# Patient Record
Sex: Male | Born: 1950 | Race: White | Hispanic: No | Smoking: Never smoker
Health system: Southern US, Community
[De-identification: ages and names within clinical notes are randomized; demographics above are authoritative.]

## PROBLEM LIST (undated history)

## (undated) DIAGNOSIS — I4891 Unspecified atrial fibrillation: Secondary | ICD-10-CM

## (undated) DIAGNOSIS — I10 Essential (primary) hypertension: Secondary | ICD-10-CM

## (undated) DIAGNOSIS — C140 Malignant neoplasm of pharynx, unspecified: Secondary | ICD-10-CM

## (undated) DIAGNOSIS — E119 Type 2 diabetes mellitus without complications: Secondary | ICD-10-CM

## (undated) DIAGNOSIS — I517 Cardiomegaly: Secondary | ICD-10-CM

## (undated) DIAGNOSIS — E785 Hyperlipidemia, unspecified: Secondary | ICD-10-CM

## (undated) DIAGNOSIS — K219 Gastro-esophageal reflux disease without esophagitis: Secondary | ICD-10-CM

## (undated) DIAGNOSIS — G4733 Obstructive sleep apnea (adult) (pediatric): Secondary | ICD-10-CM

## (undated) DIAGNOSIS — E039 Hypothyroidism, unspecified: Secondary | ICD-10-CM

## (undated) DIAGNOSIS — G4730 Sleep apnea, unspecified: Secondary | ICD-10-CM

## (undated) DIAGNOSIS — Z7901 Long term (current) use of anticoagulants: Secondary | ICD-10-CM

## (undated) DIAGNOSIS — C801 Malignant (primary) neoplasm, unspecified: Secondary | ICD-10-CM

## (undated) DIAGNOSIS — Z5181 Encounter for therapeutic drug level monitoring: Secondary | ICD-10-CM

## (undated) DIAGNOSIS — I4819 Other persistent atrial fibrillation: Secondary | ICD-10-CM

## (undated) DIAGNOSIS — I2510 Atherosclerotic heart disease of native coronary artery without angina pectoris: Secondary | ICD-10-CM

## (undated) HISTORY — DX: Hyperlipidemia, unspecified: E78.5

## (undated) HISTORY — PX: CARDIAC CATHETERIZATION: SHX172

## (undated) HISTORY — DX: Gastro-esophageal reflux disease without esophagitis: K21.9

## (undated) HISTORY — DX: Long term (current) use of anticoagulants: Z79.01

## (undated) HISTORY — DX: Malignant neoplasm of pharynx, unspecified: C14.0

## (undated) HISTORY — DX: Atherosclerotic heart disease of native coronary artery without angina pectoris: I25.10

## (undated) HISTORY — DX: Encounter for therapeutic drug level monitoring: Z51.81

## (undated) HISTORY — DX: Sleep apnea, unspecified: G47.30

## (undated) HISTORY — DX: Obstructive sleep apnea (adult) (pediatric): G47.33

## (undated) HISTORY — DX: Type 2 diabetes mellitus without complications: E11.9

## (undated) HISTORY — DX: Cardiomegaly: I51.7

## (undated) HISTORY — PX: BREAST SURGERY: SHX581

## (undated) HISTORY — DX: Other persistent atrial fibrillation: I48.19

## (undated) HISTORY — DX: Unspecified atrial fibrillation: I48.91

## (undated) HISTORY — DX: Essential (primary) hypertension: I10

## (undated) HISTORY — DX: Hypothyroidism, unspecified: E03.9

---

## 1998-01-08 ENCOUNTER — Ambulatory Visit: Admission: RE | Admit: 1998-01-08 | Discharge: 1998-01-08 | Payer: Self-pay | Admitting: Family Medicine

## 1998-06-11 ENCOUNTER — Ambulatory Visit: Admission: RE | Admit: 1998-06-11 | Discharge: 1998-06-11 | Payer: Self-pay | Admitting: Family Medicine

## 1998-09-19 ENCOUNTER — Ambulatory Visit: Admission: RE | Admit: 1998-09-19 | Discharge: 1998-09-19 | Payer: Self-pay | Admitting: Internal Medicine

## 2002-12-30 ENCOUNTER — Encounter: Admission: RE | Admit: 2002-12-30 | Discharge: 2002-12-30 | Payer: Self-pay | Admitting: Interventional Cardiology

## 2002-12-30 ENCOUNTER — Encounter: Payer: Self-pay | Admitting: Interventional Cardiology

## 2003-01-01 ENCOUNTER — Ambulatory Visit (HOSPITAL_COMMUNITY): Admission: RE | Admit: 2003-01-01 | Discharge: 2003-01-01 | Payer: Self-pay | Admitting: Interventional Cardiology

## 2004-02-01 ENCOUNTER — Ambulatory Visit (HOSPITAL_COMMUNITY): Admission: RE | Admit: 2004-02-01 | Discharge: 2004-02-01 | Payer: Self-pay | Admitting: Gastroenterology

## 2004-02-01 ENCOUNTER — Encounter (INDEPENDENT_AMBULATORY_CARE_PROVIDER_SITE_OTHER): Payer: Self-pay | Admitting: *Deleted

## 2004-04-26 ENCOUNTER — Emergency Department (HOSPITAL_COMMUNITY): Admission: EM | Admit: 2004-04-26 | Discharge: 2004-04-26 | Payer: Self-pay | Admitting: Emergency Medicine

## 2006-02-20 ENCOUNTER — Ambulatory Visit: Admission: RE | Admit: 2006-02-20 | Discharge: 2006-05-21 | Payer: Self-pay | Admitting: Radiation Oncology

## 2006-02-21 ENCOUNTER — Ambulatory Visit: Payer: Self-pay | Admitting: Internal Medicine

## 2006-02-22 ENCOUNTER — Ambulatory Visit (HOSPITAL_COMMUNITY): Admission: RE | Admit: 2006-02-22 | Discharge: 2006-02-22 | Payer: Self-pay | Admitting: *Deleted

## 2006-02-23 ENCOUNTER — Ambulatory Visit: Payer: Self-pay | Admitting: Dentistry

## 2006-02-23 ENCOUNTER — Encounter: Admission: EM | Admit: 2006-02-23 | Discharge: 2006-02-23 | Payer: Self-pay | Admitting: Radiation Oncology

## 2006-03-01 ENCOUNTER — Ambulatory Visit (HOSPITAL_COMMUNITY): Admission: RE | Admit: 2006-03-01 | Discharge: 2006-03-01 | Payer: Self-pay | Admitting: Radiation Oncology

## 2006-03-06 ENCOUNTER — Ambulatory Visit: Payer: Self-pay | Admitting: Dentistry

## 2006-03-06 ENCOUNTER — Ambulatory Visit (HOSPITAL_COMMUNITY): Admission: RE | Admit: 2006-03-06 | Discharge: 2006-03-06 | Payer: Self-pay | Admitting: Dentistry

## 2006-03-13 LAB — CBC WITH DIFFERENTIAL/PLATELET
Basophils Absolute: 0 10*3/uL (ref 0.0–0.1)
EOS%: 7.1 % — ABNORMAL HIGH (ref 0.0–7.0)
Eosinophils Absolute: 0.5 10*3/uL (ref 0.0–0.5)
LYMPH%: 16.2 % (ref 14.0–48.0)
MCH: 30.5 pg (ref 28.0–33.4)
MCV: 86.6 fL (ref 81.6–98.0)
MONO%: 11 % (ref 0.0–13.0)
NEUT#: 4.8 10*3/uL (ref 1.5–6.5)
Platelets: 269 10*3/uL (ref 145–400)
RBC: 4.34 10*6/uL (ref 4.20–5.71)
RDW: 12.9 % (ref 11.2–14.6)

## 2006-03-13 LAB — COMPREHENSIVE METABOLIC PANEL
AST: 15 U/L (ref 0–37)
Alkaline Phosphatase: 57 U/L (ref 39–117)
BUN: 15 mg/dL (ref 6–23)
Glucose, Bld: 204 mg/dL — ABNORMAL HIGH (ref 70–99)
Potassium: 4.1 mEq/L (ref 3.5–5.3)
Sodium: 138 mEq/L (ref 135–145)
Total Bilirubin: 0.5 mg/dL (ref 0.3–1.2)

## 2006-03-16 ENCOUNTER — Ambulatory Visit (HOSPITAL_COMMUNITY): Admission: RE | Admit: 2006-03-16 | Discharge: 2006-03-16 | Payer: Self-pay | Admitting: Internal Medicine

## 2006-03-19 ENCOUNTER — Ambulatory Visit (HOSPITAL_COMMUNITY): Admission: RE | Admit: 2006-03-19 | Discharge: 2006-03-19 | Payer: Self-pay | Admitting: Internal Medicine

## 2006-03-20 LAB — CBC WITH DIFFERENTIAL/PLATELET
Basophils Absolute: 0 10*3/uL (ref 0.0–0.1)
EOS%: 0 % (ref 0.0–7.0)
Eosinophils Absolute: 0 10*3/uL (ref 0.0–0.5)
HGB: 12.8 g/dL — ABNORMAL LOW (ref 13.0–17.1)
LYMPH%: 5.3 % — ABNORMAL LOW (ref 14.0–48.0)
MCH: 30.2 pg (ref 28.0–33.4)
MCV: 87 fL (ref 81.6–98.0)
MONO%: 3.9 % (ref 0.0–13.0)
NEUT#: 11.7 10*3/uL — ABNORMAL HIGH (ref 1.5–6.5)
NEUT%: 90.7 % — ABNORMAL HIGH (ref 40.0–75.0)
Platelets: 256 10*3/uL (ref 145–400)

## 2006-03-20 LAB — COMPREHENSIVE METABOLIC PANEL
Albumin: 4.3 g/dL (ref 3.5–5.2)
Alkaline Phosphatase: 55 U/L (ref 39–117)
BUN: 19 mg/dL (ref 6–23)
Creatinine, Ser: 1.07 mg/dL (ref 0.40–1.50)
Glucose, Bld: 295 mg/dL — ABNORMAL HIGH (ref 70–99)
Total Bilirubin: 0.4 mg/dL (ref 0.3–1.2)

## 2006-03-27 LAB — COMPREHENSIVE METABOLIC PANEL
AST: 12 U/L (ref 0–37)
Albumin: 4.1 g/dL (ref 3.5–5.2)
Alkaline Phosphatase: 63 U/L (ref 39–117)
Potassium: 4 mEq/L (ref 3.5–5.3)
Sodium: 134 mEq/L — ABNORMAL LOW (ref 135–145)
Total Bilirubin: 0.9 mg/dL (ref 0.3–1.2)
Total Protein: 6.7 g/dL (ref 6.0–8.3)

## 2006-03-27 LAB — CBC WITH DIFFERENTIAL/PLATELET
EOS%: 14.6 % — ABNORMAL HIGH (ref 0.0–7.0)
MCH: 30.3 pg (ref 28.0–33.4)
MCHC: 35.5 g/dL (ref 32.0–35.9)
MCV: 85.5 fL (ref 81.6–98.0)
MONO%: 19.5 % — ABNORMAL HIGH (ref 0.0–13.0)
RBC: 4.32 10*6/uL (ref 4.20–5.71)
RDW: 12.3 % (ref 11.2–14.6)

## 2006-04-06 ENCOUNTER — Ambulatory Visit: Payer: Self-pay | Admitting: Internal Medicine

## 2006-04-10 LAB — COMPREHENSIVE METABOLIC PANEL
Albumin: 4.1 g/dL (ref 3.5–5.2)
Alkaline Phosphatase: 69 U/L (ref 39–117)
BUN: 20 mg/dL (ref 6–23)
CO2: 23 mEq/L (ref 19–32)
Glucose, Bld: 255 mg/dL — ABNORMAL HIGH (ref 70–99)
Potassium: 4.4 mEq/L (ref 3.5–5.3)
Total Bilirubin: 0.3 mg/dL (ref 0.3–1.2)

## 2006-04-10 LAB — CBC WITH DIFFERENTIAL/PLATELET
BASO%: 0.3 % (ref 0.0–2.0)
Eosinophils Absolute: 0 10*3/uL (ref 0.0–0.5)
LYMPH%: 7 % — ABNORMAL LOW (ref 14.0–48.0)
MCHC: 35.9 g/dL (ref 32.0–35.9)
MONO#: 1.1 10*3/uL — ABNORMAL HIGH (ref 0.1–0.9)
NEUT#: 16.6 10*3/uL — ABNORMAL HIGH (ref 1.5–6.5)
Platelets: 292 10*3/uL (ref 145–400)
RBC: 3.8 10*6/uL — ABNORMAL LOW (ref 4.20–5.71)
WBC: 19.1 10*3/uL — ABNORMAL HIGH (ref 4.0–10.0)
lymph#: 1.3 10*3/uL (ref 0.9–3.3)

## 2006-04-10 LAB — MAGNESIUM: Magnesium: 1.4 mg/dL — ABNORMAL LOW (ref 1.5–2.5)

## 2006-04-16 ENCOUNTER — Inpatient Hospital Stay (HOSPITAL_COMMUNITY): Admission: EM | Admit: 2006-04-16 | Discharge: 2006-04-26 | Payer: Self-pay | Admitting: Emergency Medicine

## 2006-04-17 ENCOUNTER — Ambulatory Visit: Payer: Self-pay | Admitting: Internal Medicine

## 2006-04-23 ENCOUNTER — Encounter (INDEPENDENT_AMBULATORY_CARE_PROVIDER_SITE_OTHER): Payer: Self-pay | Admitting: Interventional Cardiology

## 2006-04-30 LAB — CBC WITH DIFFERENTIAL/PLATELET
Basophils Absolute: 0 10*3/uL (ref 0.0–0.1)
EOS%: 0 % (ref 0.0–7.0)
Eosinophils Absolute: 0 10*3/uL (ref 0.0–0.5)
HCT: 31.5 % — ABNORMAL LOW (ref 38.7–49.9)
HGB: 10.8 g/dL — ABNORMAL LOW (ref 13.0–17.1)
LYMPH%: 13.8 % — ABNORMAL LOW (ref 14.0–48.0)
MCH: 30.1 pg (ref 28.0–33.4)
MCV: 87.7 fL (ref 81.6–98.0)
MONO%: 7.1 % (ref 0.0–13.0)
NEUT#: 9.2 10*3/uL — ABNORMAL HIGH (ref 1.5–6.5)
NEUT%: 78.9 % — ABNORMAL HIGH (ref 40.0–75.0)
Platelets: 263 10*3/uL (ref 145–400)

## 2006-04-30 LAB — COMPREHENSIVE METABOLIC PANEL
AST: 13 U/L (ref 0–37)
Albumin: 3.8 g/dL (ref 3.5–5.2)
BUN: 18 mg/dL (ref 6–23)
Calcium: 9 mg/dL (ref 8.4–10.5)
Chloride: 103 mEq/L (ref 96–112)
Glucose, Bld: 189 mg/dL — ABNORMAL HIGH (ref 70–99)
Potassium: 4.2 mEq/L (ref 3.5–5.3)

## 2006-05-08 LAB — CBC WITH DIFFERENTIAL/PLATELET
EOS%: 1.3 % (ref 0.0–7.0)
Eosinophils Absolute: 0.1 10*3/uL (ref 0.0–0.5)
MCV: 86.1 fL (ref 81.6–98.0)
MONO%: 12.7 % (ref 0.0–13.0)
NEUT#: 4.6 10*3/uL (ref 1.5–6.5)
RBC: 3.52 10*6/uL — ABNORMAL LOW (ref 4.20–5.71)
RDW: 15 % — ABNORMAL HIGH (ref 11.2–14.6)

## 2006-05-08 LAB — COMPREHENSIVE METABOLIC PANEL
ALT: 13 U/L (ref 0–53)
AST: 12 U/L (ref 0–37)
Albumin: 3.5 g/dL (ref 3.5–5.2)
Alkaline Phosphatase: 59 U/L (ref 39–117)
Glucose, Bld: 183 mg/dL — ABNORMAL HIGH (ref 70–99)
Potassium: 4.5 mEq/L (ref 3.5–5.3)
Sodium: 139 mEq/L (ref 135–145)
Total Protein: 5.7 g/dL — ABNORMAL LOW (ref 6.0–8.3)

## 2006-05-16 LAB — COMPREHENSIVE METABOLIC PANEL
AST: 12 U/L (ref 0–37)
Albumin: 3.9 g/dL (ref 3.5–5.2)
Alkaline Phosphatase: 51 U/L (ref 39–117)
BUN: 25 mg/dL — ABNORMAL HIGH (ref 6–23)
Calcium: 9.2 mg/dL (ref 8.4–10.5)
Chloride: 102 mEq/L (ref 96–112)
Potassium: 4.1 mEq/L (ref 3.5–5.3)
Sodium: 140 mEq/L (ref 135–145)
Total Protein: 6.3 g/dL (ref 6.0–8.3)

## 2006-05-16 LAB — CBC WITH DIFFERENTIAL/PLATELET
BASO%: 0.7 % (ref 0.0–2.0)
LYMPH%: 7.1 % — ABNORMAL LOW (ref 14.0–48.0)
MCH: 29.7 pg (ref 28.0–33.4)
MCHC: 33.2 g/dL (ref 32.0–35.9)
MCV: 89.4 fL (ref 81.6–98.0)
MONO%: 13.3 % — ABNORMAL HIGH (ref 0.0–13.0)
Platelets: 213 10*3/uL (ref 145–400)
RBC: 3.59 10*6/uL — ABNORMAL LOW (ref 4.20–5.71)

## 2006-05-18 ENCOUNTER — Ambulatory Visit: Payer: Self-pay | Admitting: Internal Medicine

## 2006-05-22 ENCOUNTER — Ambulatory Visit: Admission: RE | Admit: 2006-05-22 | Discharge: 2006-08-16 | Payer: Self-pay | Admitting: Radiation Oncology

## 2006-05-22 LAB — CBC WITH DIFFERENTIAL/PLATELET
BASO%: 0.9 % (ref 0.0–2.0)
Basophils Absolute: 0.1 10*3/uL (ref 0.0–0.1)
EOS%: 2.1 % (ref 0.0–7.0)
HGB: 10 g/dL — ABNORMAL LOW (ref 13.0–17.1)
MCH: 30.1 pg (ref 28.0–33.4)
MCHC: 34 g/dL (ref 32.0–35.9)
MCV: 88.4 fL (ref 81.6–98.0)
MONO%: 15.3 % — ABNORMAL HIGH (ref 0.0–13.0)
RBC: 3.32 10*6/uL — ABNORMAL LOW (ref 4.20–5.71)
RDW: 15 % — ABNORMAL HIGH (ref 11.2–14.6)
lymph#: 0.4 10*3/uL — ABNORMAL LOW (ref 0.9–3.3)

## 2006-05-22 LAB — COMPREHENSIVE METABOLIC PANEL
ALT: 9 U/L (ref 0–53)
AST: 10 U/L (ref 0–37)
Albumin: 3.8 g/dL (ref 3.5–5.2)
Alkaline Phosphatase: 60 U/L (ref 39–117)
BUN: 25 mg/dL — ABNORMAL HIGH (ref 6–23)
Calcium: 8.8 mg/dL (ref 8.4–10.5)
Chloride: 98 mEq/L (ref 96–112)
Potassium: 3.7 mEq/L (ref 3.5–5.3)
Sodium: 135 mEq/L (ref 135–145)
Total Protein: 5.9 g/dL — ABNORMAL LOW (ref 6.0–8.3)

## 2006-05-25 LAB — COMPREHENSIVE METABOLIC PANEL
AST: 15 U/L (ref 0–37)
Albumin: 3.1 g/dL — ABNORMAL LOW (ref 3.5–5.2)
Alkaline Phosphatase: 56 U/L (ref 39–117)
BUN: 23 mg/dL (ref 6–23)
Creatinine, Ser: 1.05 mg/dL (ref 0.40–1.50)
Glucose, Bld: 275 mg/dL — ABNORMAL HIGH (ref 70–99)
Potassium: 4.3 mEq/L (ref 3.5–5.3)

## 2006-05-29 LAB — COMPREHENSIVE METABOLIC PANEL
BUN: 16 mg/dL (ref 6–23)
CO2: 25 mEq/L (ref 19–32)
Calcium: 8.4 mg/dL (ref 8.4–10.5)
Chloride: 98 mEq/L (ref 96–112)
Creatinine, Ser: 0.81 mg/dL (ref 0.40–1.50)
Glucose, Bld: 305 mg/dL — ABNORMAL HIGH (ref 70–99)
Total Bilirubin: 0.5 mg/dL (ref 0.3–1.2)

## 2006-05-29 LAB — CBC WITH DIFFERENTIAL/PLATELET
BASO%: 0.7 % (ref 0.0–2.0)
Basophils Absolute: 0 10*3/uL (ref 0.0–0.1)
HCT: 30.7 % — ABNORMAL LOW (ref 38.7–49.9)
HGB: 10.8 g/dL — ABNORMAL LOW (ref 13.0–17.1)
LYMPH%: 7.5 % — ABNORMAL LOW (ref 14.0–48.0)
MCHC: 35.4 g/dL (ref 32.0–35.9)
MONO#: 0.5 10*3/uL (ref 0.1–0.9)
NEUT%: 80.1 % — ABNORMAL HIGH (ref 40.0–75.0)
Platelets: 154 10*3/uL (ref 145–400)
WBC: 5.1 10*3/uL (ref 4.0–10.0)
lymph#: 0.4 10*3/uL — ABNORMAL LOW (ref 0.9–3.3)

## 2006-06-05 LAB — CBC WITH DIFFERENTIAL/PLATELET
Basophils Absolute: 0.1 10*3/uL (ref 0.0–0.1)
Eosinophils Absolute: 0.1 10*3/uL (ref 0.0–0.5)
HCT: 34.1 % — ABNORMAL LOW (ref 38.7–49.9)
HGB: 11.5 g/dL — ABNORMAL LOW (ref 13.0–17.1)
MCH: 30.6 pg (ref 28.0–33.4)
MONO#: 0.7 10*3/uL (ref 0.1–0.9)
NEUT#: 4.7 10*3/uL (ref 1.5–6.5)
NEUT%: 80 % — ABNORMAL HIGH (ref 40.0–75.0)
WBC: 5.9 10*3/uL (ref 4.0–10.0)
lymph#: 0.4 10*3/uL — ABNORMAL LOW (ref 0.9–3.3)

## 2006-06-12 LAB — CBC WITH DIFFERENTIAL/PLATELET
Basophils Absolute: 0 10*3/uL (ref 0.0–0.1)
EOS%: 1 % (ref 0.0–7.0)
Eosinophils Absolute: 0 10*3/uL (ref 0.0–0.5)
HCT: 36 % — ABNORMAL LOW (ref 38.7–49.9)
HGB: 12.1 g/dL — ABNORMAL LOW (ref 13.0–17.1)
MCH: 30.8 pg (ref 28.0–33.4)
MCV: 91.5 fL (ref 81.6–98.0)
MONO%: 14.2 % — ABNORMAL HIGH (ref 0.0–13.0)
NEUT#: 3.5 10*3/uL (ref 1.5–6.5)
NEUT%: 78.6 % — ABNORMAL HIGH (ref 40.0–75.0)
Platelets: 204 10*3/uL (ref 145–400)

## 2006-06-12 LAB — COMPREHENSIVE METABOLIC PANEL
AST: 9 U/L (ref 0–37)
Albumin: 3.7 g/dL (ref 3.5–5.2)
Alkaline Phosphatase: 70 U/L (ref 39–117)
BUN: 22 mg/dL (ref 6–23)
Calcium: 8.9 mg/dL (ref 8.4–10.5)
Creatinine, Ser: 0.87 mg/dL (ref 0.40–1.50)
Glucose, Bld: 289 mg/dL — ABNORMAL HIGH (ref 70–99)

## 2006-06-20 LAB — CBC WITH DIFFERENTIAL/PLATELET
Basophils Absolute: 0 10*3/uL (ref 0.0–0.1)
EOS%: 0.8 % (ref 0.0–7.0)
Eosinophils Absolute: 0 10*3/uL (ref 0.0–0.5)
HCT: 34.2 % — ABNORMAL LOW (ref 38.7–49.9)
HGB: 11.6 g/dL — ABNORMAL LOW (ref 13.0–17.1)
MCH: 31 pg (ref 28.0–33.4)
NEUT#: 2.5 10*3/uL (ref 1.5–6.5)
NEUT%: 72.6 % (ref 40.0–75.0)
RDW: 17.8 % — ABNORMAL HIGH (ref 11.2–14.6)
lymph#: 0.2 10*3/uL — ABNORMAL LOW (ref 0.9–3.3)

## 2006-06-27 LAB — CBC WITH DIFFERENTIAL/PLATELET
BASO%: 1.9 % (ref 0.0–2.0)
Basophils Absolute: 0.1 10*3/uL (ref 0.0–0.1)
Eosinophils Absolute: 0 10*3/uL (ref 0.0–0.5)
HCT: 32.9 % — ABNORMAL LOW (ref 38.7–49.9)
HGB: 11.1 g/dL — ABNORMAL LOW (ref 13.0–17.1)
LYMPH%: 11.2 % — ABNORMAL LOW (ref 14.0–48.0)
MCHC: 33.8 g/dL (ref 32.0–35.9)
MONO#: 0.7 10*3/uL (ref 0.1–0.9)
NEUT#: 2 10*3/uL (ref 1.5–6.5)
NEUT%: 63.3 % (ref 40.0–75.0)
Platelets: 259 10*3/uL (ref 145–400)
WBC: 3.2 10*3/uL — ABNORMAL LOW (ref 4.0–10.0)
lymph#: 0.4 10*3/uL — ABNORMAL LOW (ref 0.9–3.3)

## 2006-07-27 ENCOUNTER — Ambulatory Visit: Payer: Self-pay | Admitting: Internal Medicine

## 2006-07-31 LAB — CBC WITH DIFFERENTIAL/PLATELET
BASO%: 0.3 % (ref 0.0–2.0)
Basophils Absolute: 0 10*3/uL (ref 0.0–0.1)
EOS%: 3.3 % (ref 0.0–7.0)
HCT: 29.8 % — ABNORMAL LOW (ref 38.7–49.9)
HGB: 10.4 g/dL — ABNORMAL LOW (ref 13.0–17.1)
LYMPH%: 6 % — ABNORMAL LOW (ref 14.0–48.0)
MCH: 30.8 pg (ref 28.0–33.4)
MCHC: 35 g/dL (ref 32.0–35.9)
MCV: 87.9 fL (ref 81.6–98.0)
NEUT%: 79.1 % — ABNORMAL HIGH (ref 40.0–75.0)
Platelets: 296 10*3/uL (ref 145–400)
lymph#: 0.4 10*3/uL — ABNORMAL LOW (ref 0.9–3.3)

## 2006-07-31 LAB — COMPREHENSIVE METABOLIC PANEL
AST: 12 U/L (ref 0–37)
BUN: 22 mg/dL (ref 6–23)
CO2: 29 mEq/L (ref 19–32)
Calcium: 9.2 mg/dL (ref 8.4–10.5)
Chloride: 94 mEq/L — ABNORMAL LOW (ref 96–112)
Creatinine, Ser: 0.92 mg/dL (ref 0.40–1.50)
Total Bilirubin: 0.4 mg/dL (ref 0.3–1.2)

## 2006-08-03 ENCOUNTER — Ambulatory Visit (HOSPITAL_COMMUNITY): Admission: RE | Admit: 2006-08-03 | Discharge: 2006-08-03 | Payer: Self-pay | Admitting: Internal Medicine

## 2006-09-11 ENCOUNTER — Ambulatory Visit: Payer: Self-pay | Admitting: Internal Medicine

## 2006-10-26 ENCOUNTER — Ambulatory Visit: Payer: Self-pay | Admitting: Internal Medicine

## 2006-10-30 ENCOUNTER — Ambulatory Visit (HOSPITAL_COMMUNITY): Admission: RE | Admit: 2006-10-30 | Discharge: 2006-10-30 | Payer: Self-pay | Admitting: Internal Medicine

## 2006-10-30 LAB — CBC WITH DIFFERENTIAL/PLATELET
Basophils Absolute: 0 10*3/uL (ref 0.0–0.1)
HCT: 32 % — ABNORMAL LOW (ref 38.7–49.9)
HGB: 11.1 g/dL — ABNORMAL LOW (ref 13.0–17.1)
LYMPH%: 7.8 % — ABNORMAL LOW (ref 14.0–48.0)
MONO#: 0.5 10*3/uL (ref 0.1–0.9)
NEUT%: 80.4 % — ABNORMAL HIGH (ref 40.0–75.0)
Platelets: 208 10*3/uL (ref 145–400)
WBC: 5.4 10*3/uL (ref 4.0–10.0)
lymph#: 0.4 10*3/uL — ABNORMAL LOW (ref 0.9–3.3)

## 2006-10-30 LAB — COMPREHENSIVE METABOLIC PANEL
BUN: 17 mg/dL (ref 6–23)
CO2: 25 mEq/L (ref 19–32)
Calcium: 9.6 mg/dL (ref 8.4–10.5)
Chloride: 103 mEq/L (ref 96–112)
Creatinine, Ser: 0.98 mg/dL (ref 0.40–1.50)
Glucose, Bld: 124 mg/dL — ABNORMAL HIGH (ref 70–99)

## 2006-12-27 ENCOUNTER — Ambulatory Visit: Payer: Self-pay | Admitting: Internal Medicine

## 2007-01-29 LAB — CBC WITH DIFFERENTIAL/PLATELET
BASO%: 0.3 % (ref 0.0–2.0)
Basophils Absolute: 0 10*3/uL (ref 0.0–0.1)
Eosinophils Absolute: 0.2 10*3/uL (ref 0.0–0.5)
HCT: 33.9 % — ABNORMAL LOW (ref 38.7–49.9)
HGB: 11.9 g/dL — ABNORMAL LOW (ref 13.0–17.1)
MCHC: 35 g/dL (ref 32.0–35.9)
MONO#: 0.7 10*3/uL (ref 0.1–0.9)
NEUT#: 2.8 10*3/uL (ref 1.5–6.5)
NEUT%: 64.7 % (ref 40.0–75.0)
WBC: 4.3 10*3/uL (ref 4.0–10.0)
lymph#: 0.6 10*3/uL — ABNORMAL LOW (ref 0.9–3.3)

## 2007-01-29 LAB — COMPREHENSIVE METABOLIC PANEL
ALT: 12 U/L (ref 0–53)
Alkaline Phosphatase: 48 U/L (ref 39–117)
Creatinine, Ser: 1 mg/dL (ref 0.40–1.50)
Sodium: 143 mEq/L (ref 135–145)
Total Bilirubin: 0.5 mg/dL (ref 0.3–1.2)
Total Protein: 6.5 g/dL (ref 6.0–8.3)

## 2007-01-31 ENCOUNTER — Ambulatory Visit (HOSPITAL_COMMUNITY): Admission: RE | Admit: 2007-01-31 | Discharge: 2007-01-31 | Payer: Self-pay | Admitting: Internal Medicine

## 2007-02-11 ENCOUNTER — Ambulatory Visit (HOSPITAL_COMMUNITY): Admission: RE | Admit: 2007-02-11 | Discharge: 2007-02-11 | Payer: Self-pay | Admitting: Internal Medicine

## 2007-02-21 ENCOUNTER — Ambulatory Visit: Payer: Self-pay | Admitting: Internal Medicine

## 2007-04-26 ENCOUNTER — Ambulatory Visit (HOSPITAL_COMMUNITY): Admission: RE | Admit: 2007-04-26 | Discharge: 2007-04-26 | Payer: Self-pay | Admitting: Interventional Radiology

## 2007-05-28 ENCOUNTER — Ambulatory Visit: Payer: Self-pay | Admitting: Internal Medicine

## 2007-05-30 ENCOUNTER — Ambulatory Visit (HOSPITAL_COMMUNITY): Admission: RE | Admit: 2007-05-30 | Discharge: 2007-05-30 | Payer: Self-pay | Admitting: Internal Medicine

## 2007-05-30 LAB — COMPREHENSIVE METABOLIC PANEL
Alkaline Phosphatase: 51 U/L (ref 39–117)
CO2: 30 mEq/L (ref 19–32)
Creatinine, Ser: 1.04 mg/dL (ref 0.40–1.50)
Glucose, Bld: 128 mg/dL — ABNORMAL HIGH (ref 70–99)
Sodium: 138 mEq/L (ref 135–145)
Total Bilirubin: 0.6 mg/dL (ref 0.3–1.2)
Total Protein: 6.7 g/dL (ref 6.0–8.3)

## 2007-05-30 LAB — CBC WITH DIFFERENTIAL/PLATELET
BASO%: 0.5 % (ref 0.0–2.0)
Eosinophils Absolute: 0.2 10*3/uL (ref 0.0–0.5)
HCT: 37.1 % — ABNORMAL LOW (ref 38.7–49.9)
LYMPH%: 12.6 % — ABNORMAL LOW (ref 14.0–48.0)
MCHC: 35 g/dL (ref 32.0–35.9)
MCV: 88.6 fL (ref 81.6–98.0)
MONO#: 0.5 10*3/uL (ref 0.1–0.9)
MONO%: 10.1 % (ref 0.0–13.0)
NEUT%: 73.3 % (ref 40.0–75.0)
Platelets: 217 10*3/uL (ref 145–400)
RBC: 4.19 10*6/uL — ABNORMAL LOW (ref 4.20–5.71)

## 2007-06-06 LAB — CBC WITH DIFFERENTIAL/PLATELET
Basophils Absolute: 0 10*3/uL (ref 0.0–0.1)
Eosinophils Absolute: 0.1 10*3/uL (ref 0.0–0.5)
HCT: 34.7 % — ABNORMAL LOW (ref 38.7–49.9)
LYMPH%: 8.6 % — ABNORMAL LOW (ref 14.0–48.0)
MCV: 88.5 fL (ref 81.6–98.0)
MONO#: 0.5 10*3/uL (ref 0.1–0.9)
MONO%: 9 % (ref 0.0–13.0)
NEUT#: 4.4 10*3/uL (ref 1.5–6.5)
NEUT%: 79.4 % — ABNORMAL HIGH (ref 40.0–75.0)
Platelets: 170 10*3/uL (ref 145–400)
WBC: 5.5 10*3/uL (ref 4.0–10.0)

## 2007-06-06 LAB — COMPREHENSIVE METABOLIC PANEL
Alkaline Phosphatase: 48 U/L (ref 39–117)
BUN: 12 mg/dL (ref 6–23)
CO2: 25 mEq/L (ref 19–32)
Creatinine, Ser: 1.09 mg/dL (ref 0.40–1.50)
Glucose, Bld: 132 mg/dL — ABNORMAL HIGH (ref 70–99)
Sodium: 140 mEq/L (ref 135–145)
Total Bilirubin: 0.6 mg/dL (ref 0.3–1.2)
Total Protein: 6.8 g/dL (ref 6.0–8.3)

## 2007-06-06 LAB — TSH: TSH: 72.142 u[IU]/mL — ABNORMAL HIGH (ref 0.350–5.500)

## 2007-06-17 ENCOUNTER — Ambulatory Visit (HOSPITAL_COMMUNITY): Admission: RE | Admit: 2007-06-17 | Discharge: 2007-06-17 | Payer: Self-pay | Admitting: Radiation Oncology

## 2007-09-24 ENCOUNTER — Ambulatory Visit: Payer: Self-pay | Admitting: Internal Medicine

## 2007-09-26 LAB — COMPREHENSIVE METABOLIC PANEL
ALT: 15 U/L (ref 0–53)
Alkaline Phosphatase: 43 U/L (ref 39–117)
Creatinine, Ser: 1.1 mg/dL (ref 0.40–1.50)
Glucose, Bld: 137 mg/dL — ABNORMAL HIGH (ref 70–99)
Sodium: 137 mEq/L (ref 135–145)
Total Bilirubin: 0.8 mg/dL (ref 0.3–1.2)
Total Protein: 7.1 g/dL (ref 6.0–8.3)

## 2007-09-26 LAB — CBC WITH DIFFERENTIAL/PLATELET
BASO%: 0.4 % (ref 0.0–2.0)
HCT: 37.8 % — ABNORMAL LOW (ref 38.7–49.9)
LYMPH%: 15.2 % (ref 14.0–48.0)
MCHC: 35.1 g/dL (ref 32.0–35.9)
MCV: 88.4 fL (ref 81.6–98.0)
MONO%: 8.6 % (ref 0.0–13.0)
NEUT%: 71.4 % (ref 40.0–75.0)
Platelets: 197 10*3/uL (ref 145–400)
RBC: 4.27 10*6/uL (ref 4.20–5.71)

## 2007-09-26 LAB — TSH: TSH: 10.656 u[IU]/mL — ABNORMAL HIGH (ref 0.350–5.500)

## 2007-09-30 ENCOUNTER — Ambulatory Visit (HOSPITAL_COMMUNITY): Admission: RE | Admit: 2007-09-30 | Discharge: 2007-09-30 | Payer: Self-pay | Admitting: Internal Medicine

## 2008-03-19 ENCOUNTER — Ambulatory Visit: Payer: Self-pay | Admitting: Internal Medicine

## 2008-04-07 ENCOUNTER — Ambulatory Visit (HOSPITAL_COMMUNITY): Admission: RE | Admit: 2008-04-07 | Discharge: 2008-04-07 | Payer: Self-pay | Admitting: Internal Medicine

## 2008-04-07 LAB — CBC WITH DIFFERENTIAL/PLATELET
Basophils Absolute: 0 10*3/uL (ref 0.0–0.1)
EOS%: 4.2 % (ref 0.0–7.0)
Eosinophils Absolute: 0.2 10*3/uL (ref 0.0–0.5)
HCT: 38.2 % — ABNORMAL LOW (ref 38.7–49.9)
HGB: 13.3 g/dL (ref 13.0–17.1)
MONO#: 0.6 10*3/uL (ref 0.1–0.9)
NEUT#: 4 10*3/uL (ref 1.5–6.5)
RDW: 13 % (ref 11.2–14.6)
WBC: 5.5 10*3/uL (ref 4.0–10.0)
lymph#: 0.6 10*3/uL — ABNORMAL LOW (ref 0.9–3.3)

## 2008-04-07 LAB — COMPREHENSIVE METABOLIC PANEL
ALT: 14 U/L (ref 0–53)
AST: 19 U/L (ref 0–37)
Alkaline Phosphatase: 35 U/L — ABNORMAL LOW (ref 39–117)
Sodium: 139 mEq/L (ref 135–145)
Total Bilirubin: 0.6 mg/dL (ref 0.3–1.2)
Total Protein: 6.6 g/dL (ref 6.0–8.3)

## 2008-10-06 ENCOUNTER — Ambulatory Visit: Payer: Self-pay | Admitting: Internal Medicine

## 2008-10-08 ENCOUNTER — Ambulatory Visit (HOSPITAL_COMMUNITY): Admission: RE | Admit: 2008-10-08 | Discharge: 2008-10-08 | Payer: Self-pay | Admitting: Internal Medicine

## 2008-10-08 LAB — CBC WITH DIFFERENTIAL/PLATELET
Basophils Absolute: 0 10*3/uL (ref 0.0–0.1)
Eosinophils Absolute: 0.2 10*3/uL (ref 0.0–0.5)
HCT: 41.5 % (ref 38.4–49.9)
HGB: 14.6 g/dL (ref 13.0–17.1)
MCH: 30.9 pg (ref 27.2–33.4)
MCV: 87.7 fL (ref 79.3–98.0)
MONO%: 11.9 % (ref 0.0–14.0)
NEUT#: 3.4 10*3/uL (ref 1.5–6.5)
NEUT%: 69.3 % (ref 39.0–75.0)
RDW: 13.2 % (ref 11.0–14.6)
lymph#: 0.7 10*3/uL — ABNORMAL LOW (ref 0.9–3.3)

## 2008-10-08 LAB — COMPREHENSIVE METABOLIC PANEL
Albumin: 4.3 g/dL (ref 3.5–5.2)
BUN: 14 mg/dL (ref 6–23)
Calcium: 10 mg/dL (ref 8.4–10.5)
Chloride: 103 mEq/L (ref 96–112)
Creatinine, Ser: 1.27 mg/dL (ref 0.40–1.50)
Glucose, Bld: 143 mg/dL — ABNORMAL HIGH (ref 70–99)
Potassium: 4.9 mEq/L (ref 3.5–5.3)

## 2009-04-05 ENCOUNTER — Ambulatory Visit: Payer: Self-pay | Admitting: Internal Medicine

## 2009-04-07 ENCOUNTER — Ambulatory Visit (HOSPITAL_COMMUNITY): Admission: RE | Admit: 2009-04-07 | Discharge: 2009-04-07 | Payer: Self-pay | Admitting: Internal Medicine

## 2009-04-07 LAB — COMPREHENSIVE METABOLIC PANEL
AST: 23 U/L (ref 0–37)
Albumin: 4 g/dL (ref 3.5–5.2)
Alkaline Phosphatase: 35 U/L — ABNORMAL LOW (ref 39–117)
BUN: 12 mg/dL (ref 6–23)
Creatinine, Ser: 1.27 mg/dL (ref 0.40–1.50)
Glucose, Bld: 123 mg/dL — ABNORMAL HIGH (ref 70–99)
Potassium: 4.1 mEq/L (ref 3.5–5.3)

## 2009-04-07 LAB — CBC WITH DIFFERENTIAL/PLATELET
Basophils Absolute: 0 10*3/uL (ref 0.0–0.1)
EOS%: 3 % (ref 0.0–7.0)
Eosinophils Absolute: 0.1 10*3/uL (ref 0.0–0.5)
HCT: 40 % (ref 38.4–49.9)
HGB: 13.9 g/dL (ref 13.0–17.1)
LYMPH%: 13.1 % — ABNORMAL LOW (ref 14.0–49.0)
MCH: 31.4 pg (ref 27.2–33.4)
MCV: 90.2 fL (ref 79.3–98.0)
MONO%: 14.3 % — ABNORMAL HIGH (ref 0.0–14.0)
NEUT#: 3.1 10*3/uL (ref 1.5–6.5)
NEUT%: 69.1 % (ref 39.0–75.0)
Platelets: 153 10*3/uL (ref 140–400)
RDW: 13.4 % (ref 11.0–14.6)

## 2009-10-07 ENCOUNTER — Ambulatory Visit: Payer: Self-pay | Admitting: Internal Medicine

## 2009-10-11 ENCOUNTER — Ambulatory Visit (HOSPITAL_COMMUNITY): Admission: RE | Admit: 2009-10-11 | Discharge: 2009-10-11 | Payer: Self-pay | Admitting: Internal Medicine

## 2009-10-11 LAB — COMPREHENSIVE METABOLIC PANEL
ALT: 19 U/L (ref 0–53)
AST: 21 U/L (ref 0–37)
Albumin: 4.1 g/dL (ref 3.5–5.2)
Calcium: 9.5 mg/dL (ref 8.4–10.5)
Glucose, Bld: 129 mg/dL — ABNORMAL HIGH (ref 70–99)
Total Protein: 7.2 g/dL (ref 6.0–8.3)

## 2009-10-11 LAB — CBC WITH DIFFERENTIAL/PLATELET
Basophils Absolute: 0 10*3/uL (ref 0.0–0.1)
LYMPH%: 12.6 % — ABNORMAL LOW (ref 14.0–49.0)
MCH: 31.2 pg (ref 27.2–33.4)
MCHC: 34.5 g/dL (ref 32.0–36.0)
MONO#: 0.6 10*3/uL (ref 0.1–0.9)
MONO%: 10.9 % (ref 0.0–14.0)
NEUT#: 3.9 10*3/uL (ref 1.5–6.5)
NEUT%: 72.2 % (ref 39.0–75.0)
RBC: 4.4 10*6/uL (ref 4.20–5.82)
RDW: 14.1 % (ref 11.0–14.6)
lymph#: 0.7 10*3/uL — ABNORMAL LOW (ref 0.9–3.3)

## 2010-07-15 ENCOUNTER — Other Ambulatory Visit: Payer: Self-pay | Admitting: Internal Medicine

## 2010-07-15 DIAGNOSIS — C099 Malignant neoplasm of tonsil, unspecified: Secondary | ICD-10-CM

## 2010-07-16 ENCOUNTER — Encounter: Payer: Self-pay | Admitting: Radiation Oncology

## 2010-07-17 ENCOUNTER — Encounter: Payer: Self-pay | Admitting: Internal Medicine

## 2010-10-17 ENCOUNTER — Other Ambulatory Visit: Payer: Self-pay | Admitting: Internal Medicine

## 2010-10-17 ENCOUNTER — Ambulatory Visit (HOSPITAL_COMMUNITY)
Admission: RE | Admit: 2010-10-17 | Discharge: 2010-10-17 | Disposition: A | Discharge status: Home / Self Care | Payer: Medicare Other | Source: Ambulatory Visit | Attending: Internal Medicine | Admitting: Internal Medicine

## 2010-10-17 ENCOUNTER — Encounter (HOSPITAL_BASED_OUTPATIENT_CLINIC_OR_DEPARTMENT_OTHER): Payer: Medicare Other | Admitting: Internal Medicine

## 2010-10-17 ENCOUNTER — Encounter (HOSPITAL_COMMUNITY): Payer: Self-pay

## 2010-10-17 DIAGNOSIS — C099 Malignant neoplasm of tonsil, unspecified: Secondary | ICD-10-CM

## 2010-10-17 DIAGNOSIS — C0689 Malignant neoplasm of overlapping sites of other parts of mouth: Secondary | ICD-10-CM

## 2010-10-17 DIAGNOSIS — C090 Malignant neoplasm of tonsillar fossa: Secondary | ICD-10-CM | POA: Insufficient documentation

## 2010-10-17 DIAGNOSIS — M47812 Spondylosis without myelopathy or radiculopathy, cervical region: Secondary | ICD-10-CM | POA: Insufficient documentation

## 2010-10-17 HISTORY — DX: Essential (primary) hypertension: I10

## 2010-10-17 HISTORY — DX: Malignant (primary) neoplasm, unspecified: C80.1

## 2010-10-17 LAB — CBC WITH DIFFERENTIAL/PLATELET
Basophils Absolute: 0 10*3/uL (ref 0.0–0.1)
EOS%: 2.4 % (ref 0.0–7.0)
HCT: 44 % (ref 38.4–49.9)
LYMPH%: 14.6 % (ref 14.0–49.0)
MCHC: 34.3 g/dL (ref 32.0–36.0)
MONO#: 0.7 10*3/uL (ref 0.1–0.9)
MONO%: 11.5 % (ref 0.0–14.0)
NEUT#: 4.2 10*3/uL (ref 1.5–6.5)
Platelets: 187 10*3/uL (ref 140–400)
RBC: 5.01 10*6/uL (ref 4.20–5.82)
RDW: 12.8 % (ref 11.0–14.6)
lymph#: 0.9 10*3/uL (ref 0.9–3.3)

## 2010-10-17 LAB — CMP (CANCER CENTER ONLY)
AST: 23 U/L (ref 11–38)
Albumin: 3.8 g/dL (ref 3.3–5.5)
Alkaline Phosphatase: 52 U/L (ref 26–84)
CO2: 28 mEq/L (ref 18–33)
Calcium: 9.5 mg/dL (ref 8.0–10.3)
Potassium: 4.6 mEq/L (ref 3.3–4.7)
Total Bilirubin: 0.7 mg/dl (ref 0.20–1.60)

## 2010-10-17 MED ORDER — IOHEXOL 300 MG/ML  SOLN
100.0000 mL | Freq: Once | INTRAMUSCULAR | Status: AC | PRN
  Administered 2010-10-17: 100 mL via INTRAVENOUS

## 2010-10-19 ENCOUNTER — Encounter (HOSPITAL_BASED_OUTPATIENT_CLINIC_OR_DEPARTMENT_OTHER): Payer: Medicare Other | Admitting: Internal Medicine

## 2010-10-19 DIAGNOSIS — G5690 Unspecified mononeuropathy of unspecified upper limb: Secondary | ICD-10-CM

## 2010-10-19 DIAGNOSIS — E119 Type 2 diabetes mellitus without complications: Secondary | ICD-10-CM

## 2010-10-19 DIAGNOSIS — Z87898 Personal history of other specified conditions: Secondary | ICD-10-CM

## 2010-11-11 NOTE — Consult Note (Signed)
[name], [name] NO.:  [id number]   MEDICAL RECORD NO.:  [id number]          PATIENT TYPE:  INP   LOCATION:  1331                         FACILITY:  [facility]   PHYSICIAN:  Lyn Records, M.D.   DATE OF BIRTH:  1951/02/22   DATE OF CONSULTATION:  DATE OF DISCHARGE:                                   CONSULTATION   CARDIOLOGY CONSULT   CONCLUSIONS:  1. Orthostatic hypotension, etiology uncertain.  The patient does have a      history of neurally mediated syncope. Perhaps there is a neurally      mediated component currently active, perhaps treated by the patient's      PEG to rule out hypoadrenalism, rule out dehydration.  2. Tachycardia, probably secondary to beta blocker withdrawal, rule out      atrial fibrillation.  3. History of mild coronary artery disease with distal circumflex, 40-60%      stenosis documented by coronary angiography in the early part of this      decade.  4. Hypertension.   PLAN:  1. Resume low dose beta blocker therapy to prevent/prophylax against beta      blocker withdrawal/rebound.  2. Support stockings.  3. Check serum cortisol and aldosterone levels.  4. EKG now and in a.m..  5. May need to add mineralocorticoid if orthostasis continues.  6. Discontinue other antihypertensive therapy as you have done.   COMMENTS:  The patient is 60 years of age and unfortunately has been  diagnosed with squamous cell oropharyngeal cancer.  He has undergone  diagnosis and chemotherapy and is to start radiation therapy. H e has had a  PEG tube inserted.  He is admitted to the  hospital on 04/15/2006 because of  weakness and was found to have severe orthostasis.  He has a standing  history of hypertension and is on antihypertensive therapy.  He has a pre-  existing history of neurally mediated syncope.  His chemotherapy has been  complicated by developmental thrush on 2 occasions.   MEDICATIONS:  His medications on admission were Avandia,  Carafate, Coreg  12.5 mg b.i.d., glipizide 10 mg per day, hydrochlorothiazide 12.5 mg per  day, Lipitor 20 mg per day, Metformin 1,000 mg b.i.d., [name] 40 mg per  day, multivitamins and Vicodin.   ALLERGIES:  None known.   HABITS:  Does not smoke.   SOCIAL HISTORY:  The patient has had chest discomfort intermittently.  He  has had recurrent syncope in the mid part of this decade.   REVIEW OF SYMPTOMS:  Revealed decreased weight relatively abruptly over the  24-48 hours prior to admission.   PHYSICAL EXAMINATION:  The patient's blood pressure was significant for  severe orthostasis on admission with 124/73 while sitting or lying flat and  80/52 while standing, heart rate increasing from 79-96.  Heart rate this  evening is about 120, is irregularly irregular.  Blood pressure is 128/90.  SKIN:  Skin is clear.  No cyanosis or pallor.  HEENT:  Exam unremarkable.  No JVD is seen.  LUNGS:  Clear.  CARDIAC:  Exam  reveals a rapid, irregular rhythm.  ABDOMEN:  Abdomen is soft. The area around the PEG tube is mildly tender.  EXTREMITIES:  No edema.   LABORATORY DATA:  Reveals BUN and creatinine of 9 and 0.8.  Potassium 3.9,  hemoglobin 11.8, TSH is normal at 4.58.  Troponin, CK-MB are normal.  EKG on  admission was essentially normal.  No acute ST, T wave changes.  Echocardiogram revealed normal LV function. No pericardial effusion.  Right  heart size and function appeared normal.   DISCUSSION:  The patient has had significant orthostasis.  The explanation  is not clear.  It may be a neurally mediated component as the patient has  had vasovagal/neurally mediated syncope in the past.  The stimulus now may  be the patient's PEG tube although this was not present on admission to the  hospital.  Other considerations would be hypoadrenalism, dehydration,  autonomic neuropathy although this would appear to have been ruled out  because his heart rate increases when his blood pressure drops.   I do feel  we need to resume low dose beta blocker therapy to blunt the patient's  increased heart rate that was noted on the exam this evening.  EKG will rule  out atrial fibrillation.      Lyn Records, M.D.  Electronically Signed     HWS/MEDQ  D:  04/23/2006  T:  04/24/2006  Job:  [id number]   cc:   Chales Salmon. Abigail Miyamoto, M.D.  Fax: [phone]   Lajuana Matte, MD  Fax: [phone]

## 2010-11-11 NOTE — Op Note (Signed)
NAME:  [name], [name]                          ACCOUNT NO.:  [id number]   MEDICAL RECORD NO.:  [id number]                   PATIENT TYPE:  AMB   LOCATION:  ENDO                                 FACILITY:  [facility]   PHYSICIAN:  James L. Malon Kindle., M.D.          DATE OF BIRTH:  [birth year]   DATE OF PROCEDURE:  02/01/2004  DATE OF DISCHARGE:                                 OPERATIVE REPORT   PROCEDURE PERFORMED:  Colonoscopy and biopsy.   ENDOSCOPIST:  Llana Aliment. Edwards, M.D.   MEDICATIONS:  Fentanyl 100 mcg, Versed 10 mg IV.   INDICATIONS FOR PROCEDURE:  Diarrhea, weight loss,  change in bowel  movements.   DESCRIPTION OF PROCEDURE:  The procedure had been explained to the patient  and consent obtained.  With the patient in the left lateral decubitus  position, the Olympus scope was inserted and advanced.  The prep was quite  good and we were able to advance over to the cecum using abdominal pressure  and slight position change.  The ileocecal valve and appendiceal orifice  were seen.  The scope was withdrawn and the cecum, ascending colon,  transverse colon, descending and sigmoid colon were seen.  No significant  diverticulosis.  Several random biopsies were obtained of the colon upon  withdrawal to rule out colitis.  Rectum was free of polyps.  The scope was  withdrawn.  The patient tolerated the procedure well.   ASSESSMENT:  Grossly normal colon.  Will check biopsies to rule out colitis.  Diarrhea cause unclear at this time.   PLAN:  Will check pathology and see the patient back in the office in four  to six weeks.                                               James L. Malon Kindle., M.D.    Waldron Session  D:  02/01/2004  T:  02/01/2004  Job:  [id number]   cc:   Chales Salmon. Abigail Miyamoto, M.D.  [address]  [place]  [place] [address]  Fax: [phone]

## 2010-11-11 NOTE — Op Note (Signed)
NAME:  [name], [name]                ACCOUNT NO.:  [id number]   MEDICAL RECORD NO.:  [id number]          PATIENT TYPE:  AMB   LOCATION:  SDS                          FACILITY:  [facility]   PHYSICIAN:  Charlynne Pander, D.D.S.DATE OF BIRTH:  [birth year]   DATE OF PROCEDURE:  03/06/2006  DATE OF DISCHARGE:  03/06/2006                                 OPERATIVE REPORT   PREOPERATIVE DIAGNOSES:  1. 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  2. Pre-chemoradiation therapy dental protocol.  3. Chronic periodontitis.  4. Accretions.  5. Retained root segment.   POSTOPERATIVE DIAGNOSES:  1. 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  2. Pre-chemoradiation therapy dental protocol.  3. Chronic periodontitis.  4. Accretions.  5. Retained root segment.   OPERATIONS:  1. Extraction of teeth numbers 13, 15, 18, 23, 24, 25 and 26.  2. Three quadrants of alveoloplasty.  3. Gross debridement of remaining dentition.   SURGEON:  Charlynne Pander, D.D.S.   ASSISTANT:  Elliot Dally (dental assistant)   ANESTHESIA:  Monitored anesthesia care per the anesthesia team.   MEDICATIONS:  1. Ancef 1 gram IV prior to invasive dental procedures.  2. Local anesthesia with total utilization of six carpules each containing      36 mg Xylocaine with 0.018 mg of epinephrine as well as two carpules      each containing 9 mg of bupivacaine with 0.009 mg of epinephrine.   SPECIMENS:  There were seven teeth which were discarded.   CULTURES:  None.   DRAINS:  None.   COMPLICATIONS:  None.   ESTIMATED BLOOD LOSS:  50 mL.   FLUIDS:  5 mL of lactated Ringer solution.   INDICATIONS:  The patient was recently diagnosed with squamous cell  carcinoma involving the left soft palate and tonsil.  Patient with  anticipated chemoradiation therapies.  Dental consultation requested to rule  out dental infection which would affect the patient's systemic health while  undergoing active  chemotherapy and radiation therapy as well as to prevent  future complications such as osteoradionecrosis.  The patient was examined  and treatment planned for multiple extractions with alveoloplasty and gross  debridement of the remaining dentition.  This treatment plan was again  formulated to decrease risk and complications associated with anticipated  chemoradiation therapy.   OPERATIVE FINDINGS:  The patient was examined in operating room #3.  The  teeth were identified for extraction.  The patient was noted be affected by  chronic periodontitis, accretions, retained root, and dental caries.  The  aforementioned necessitated removal of tooth numbers 13, 15, 18, 23, 24, 25  and 26 with alveoloplasty and gross debridement of the remaining dentition.   DESCRIPTION OF PROCEDURE:  The patient was brought to the main operating  room #3.  The patient was then placed in the supine position on the  operating room table.  Monitored anesthesia care was induced per the  anesthesia team.  The patient was then prepped and draped in the usual  manner for dental medicine procedure.  The oral cavity  was thoroughly  examined with findings as noted above.  The patient was then ready for the  dental medicine procedure as follows:   Local anesthesia was administered sequentially over the 1-1/2-hour long  procedure with a total utilization of six carpules each containing 36 mg of  Xylocaine with 0.018 mg of epinephrine as well as two carpules each  containing 9 mg of bupivacaine with 0.009 mg of epinephrine.   The maxillary left quadrant was first approached.  Anesthesia was achieved  with infiltration utilizing the Xylocaine with epinephrine.  At this point  in time, further anesthesia was achieved involving the mandibular arch  utilizing the bupivacaine with epinephrine and further infiltration with the  Xylocaine with epinephrine.  At this point in time, the maxillary left  quadrant was approached,  a 15 blade incision was made from the distal of the  maxillary left tuberosity and extended to the mesial of #12.  A surgical  flap was then carefully reflected.  Appropriate amounts of buccal and  interseptal bone was removed around tooth numbers 13 and 15 appropriately.  These teeth were then subluxated with a series of straight elevators.  Tooth  #15 was then removed with 53L forceps without complications.  Tooth #13 was  then removed with a 150 forceps without complications.  Alveoplasty was then  performed utilizing rongeurs and bone file.  The tissues were approximated  and trimmed appropriately.  The surgical site was then irrigated with  copious amounts of sterile saline.  The surgical site was then closed from  the maxillary left tuberosity and extended to the mesial of #13 utilizing 3-  0 chromic gut suture in a continuous interrupted suture technique x1.  One  interrupted interproximal suture was placed between tooth numbers 11 and 12.   At this point in time, the mandibular quadrants were approached.  Tooth  numbers 23, 24, 25 and 26 were then removed with 151 forceps without  complications.  Alveoloplasty was then performed utilizing rongeurs and bone  file.  The surgical site was then irrigated with copious amounts sterile  saline.  The surgical site was then closed utilizing 3-0 chromic gut suture  in a continuous interrupted suture technique from the mesial of #22 and  extended to the mesial of #24 and an additional continuous interrupted  suture was then placed from the distal of #26 and extended to the mesial of  #25 utilizing 3-0 chromic gut suture in a continuous interrupted suture  technique x1.   At this point in time, the mandibular left quadrant was approached.  A 15  blade incision was made from the distal of #17 and extended to the mesial of  #20.  A surgical flap was then carefully reflected.  Appropriate amounts of buccal and interseptal bone was removed around  tooth #18.  This tooth was  then removed with 23 forceps without complications.  Alveoplasty was then  performed utilizing rongeurs and bone file.  The tissues were approximated  and trimmed appropriately.  The surgical site was irrigated with copious  amounts sterile saline.  The surgical site was then closed from the distal  of #17 extended to the mesial of #20 utilizing 3-0 chromic gut suture in a  continuous interrupted suture technique x1.   At this point in time, the remaining dentition was approached.  The [employer] was then utilized to remove significant accretions.  A series  of hand curets were then utilized to remove further accretions.  The  gross  debridement was then refined utilizing the [employer].  At this point  in time, the entire mouth was irrigated with copious amounts of sterile  saline.  The patient was examined for complications, seeing none, the dental  medicine procedure was deemed to be complete.  At this point in time, a  series of 4x4 gauzes were placed in the mouth to aid hemostasis.  The  patient was then handed over to the anesthesia team for final disposition.  After an appropriate amount of time, the patient was taken to the post  anesthesia care unit with stable vital signs and a good oxygenation level.  All counts were correct for the dental medicine procedure.  The patient will  be followed in approximately one week for evaluation for suture removal.  The patient will then be started on chemoradiation therapy after an  appropriate amount of healing time.      Charlynne Pander, D.D.S.  Electronically Signed     RFK/MEDQ  D:  03/06/2006  T:  03/06/2006  Job:  [id number]   cc:   Lajuana Matte, MD  Maryln Gottron, M.D.

## 2010-11-11 NOTE — H&P (Signed)
NAME:  [name], [name]                ACCOUNT NO.:  [id number]   MEDICAL RECORD NO.:  [id number]          PATIENT TYPE:  INP   LOCATION:  1331                         FACILITY:  [facility]   PHYSICIAN:  Corinna L. Lendell Caprice, MDDATE OF BIRTH:  01/16/1951   DATE OF ADMISSION:  04/15/2006  DATE OF DISCHARGE:                                HISTORY & PHYSICAL   CHIEF COMPLAINT:  Weakness.   HISTORY OF PRESENT ILLNESS:  [name] is a pleasant 60 year old white male  patient of Dr. Abigail Miyamoto, who presents to the emergency room with severe  weakness.  It occurs upon standing; he was so weak he was unable to prepare  any food today, use the bathroom or take any of his medications.  He reports  that his appetite has otherwise been good.  He has had  no fevers or chills.  He reports a history of vasovagal syncope.  He has a history of  hypertension, but has not taken of his medications today.  He has a history  of squamous cell carcinoma of the soft palate and tonsil, and just had  chemotherapy last week.  He has received 2 liters of IV fluids here in the  emergency room, and continues to feel very weak.  He had orthostatic  hypotension here in the emergency room.   PAST MEDICAL HISTORY:  1. Squamous cell carcinoma of the soft palate and tonsil, status post      chemotherapy last week.  He is scheduled for a PEG tube in preparation      for radiation.  2. Hypertension.  3. Diabetes.  4. History of thrush; he ran out of his Diflucan 2 weeks ago, and this has      been a problem for him.  5. History of vasovagal syncope.   MEDICATIONS:  1. Avandia 8 mg a day.  2. Carafate as needed.  3. Coreg 12.5 mg p.o. b.i.d.  4. Glipizide 10 mg a day.  5. Hydrochlorothiazide 12.5 mg a day.  6. Lipitor 20 mg a day.  7. Metformin 1000 mg p.o. b.i.d.  8. Multivitamin a day.  9. Quinopril 40 mg a day.  10.Vicodin as needed, although he has not required any of this recently.   ALLERGIES:  NO KNOWN DRUG  ALLERGIES.   SOCIAL HISTORY:  The patient is married; his wife has multiple sclerosis.  He does not drink nor smoke.   FAMILY HISTORY:  His mother had leukemia and diabetes.  His father died of a  myocardial infarction at age 53.  His grandfather had stomach cancer.   REVIEW OF SYSTEMS:  As above, otherwise negative.   PHYSICAL EXAMINATION:  VITAL SIGNS:  Temperature 97.7, blood pressure  124/73.  His blood pressure standing is 80/52, and his pulse goes from 79 to  96.  Respiratory rate 20, oxygen saturation 100%.  Apparently after IV  fluids they repeated his orthostatics, and he went from 142/75 lying to  86/42 standing -- and was symptomatic.  GENERAL:  The patient is in no acute distress.  HEENT:  He has moon facies.  Pupils  equal, round and reactive to light.  He  has oral thrush.  NECK:  Supple, thick.  He does have some lymph nodes palpable on the right  submandibular area.  LUNGS:  Clear to auscultation bilaterally, without wheezes, rales or  rhonchi.  CARDIOVASCULAR:  Regular rate and rhythm, without murmurs, rubs or gallops.  ABDOMEN:  Normal bowel sounds; soft, nontender and nondistended.  GU/RECTAL:  Deferred.  EXTREMITIES:  No clubbing, cyanosis or edema.  SKIN:  No rash.  PSYCHIATRIC:  Normal affect.  NEUROLOGIC:  Alert and oriented.  Cranial nerves and sensory motor  examination are intact.   LABORATORIES:  CBC is unremarkable.  Basic metabolic panel significant for:  potassium 3.1, glucose 190, BUN 23, creatinine 1.1.   ASSESSMENT AND PLAN:  1. WEAKNESS AND ORTHOSTATIC HYPOTENSION.  Given his increased      ZOX:[id number] ratio, he appears dehydrated.  He will get IV fluids and      I will hold all of his antihypertensives.  He may benefit from staying      off hydrochlorothiazide indefinitely, and may need further adjustment      of his blood pressure medications.  I will place him on observation.      Repeat orthostatics in the morning.  2. DIABETES.   Continue outpatient medications.  3. SQUAMOUS CELL CANCER OF THE THROAT.  4. THRUSH.  I will resume fluconazole.      Corinna L. Lendell Caprice, MD  Electronically Signed     CLS/MEDQ  D:  04/16/2006  T:  04/16/2006  Job:  [id number]   cc:   Chales Salmon. Abigail Miyamoto, M.D.  Fax: [phone]   Lajuana Matte, MD  Fax: [phone]

## 2010-11-11 NOTE — Cardiovascular Report (Signed)
NAME:  CHAKA, JEFFERYS NO.:  [id number]   MEDICAL RECORD NO.:  [id number]                   PATIENT TYPE:  OIB   LOCATION:  2899                                 FACILITY:  [facility]   PHYSICIAN:  Lyn Records III, M.D.            DATE OF BIRTH:  [birth year]   DATE OF PROCEDURE:  01/01/2003  DATE OF DISCHARGE:                              CARDIAC CATHETERIZATION   INDICATIONS:  Exertional dyspnea, diabetes, abnormal Cardiolite study  secondary to significant blood pressure elevation and also possible fixed  inferior wall defect.   PROCEDURE PERFORMED:  1. Left heart catheterization.  2. Selective coronary angiogram.  3. Left ventriculography.   DESCRIPTION OF PROCEDURE:  After informed consent, a 6-French sheath was  placed in the right femoral artery using the modified Seldinger technique.  A 6-French A2 multipurpose catheter was used for hemodynamic recordings,  left ventriculography, and selective left and right coronary angiography.  The patient tolerated the procedure without significant difficulty.   RESULTS:   I. HEMODYNAMIC DATA:  A.  Aortic pressure 177/74.  B.  Left ventricular pressure 175/19.   II. LEFT VENTRICULOGRAPHY:  The left ventricular cavity size is normal.  Overall LV function are normal.  EF 60%.   III. CORONARY ANGIOGRAPHY:  A.  Left main coronary artery:  Proximal  irregularities with up to 25% narrowing.  B.  Left anterior descending coronary artery:  The LAD is a large vessel.  Contains proximal and ostial 30-40% narrowing.  No high grade obstruction.  C.  Circumflex artery:  The circumflex has a somewhat odd anatomy.  It  arises from the distal left main.  It gives origin to one large obtuse  marginal that trifurcates.  The first obtuse marginal contains  irregularities.  The first branch of the trifurcation contains 70% ostial  narrowing.  This is the smallest branch of the trifurcation.  The  continuation of the  circumflex beyond the first obtuse marginal terminates  on a very small second obtuse marginal and proximal to this there is an  eccentric 80% narrowing.  D.  Right coronary:  The right coronary artery is a large vessel in caliber.  There are proximal luminal irregularities.  The PDA and several small left  ventricular branches arise distally.  PDA is free of any significant  obstruction as are the left ventricular branches.   CONCLUSION:  1. Significant distal circumflex disease before a small second obtuse     marginal.  There is also significant obstruction in one of the branches     of the trifurcation of the first obtuse marginal.  Irregularities are     noted in all other coronaries, but no significant obstruction.  2. Normal left ventricular systolic function, elevated end-diastolic     pressure consistent with diastolic dysfunction.   PLAN:  Aggressive risk factor modification including increasing ACE  inhibitor therapy adding low dose diuretic and  probably switching diltiazem  to a beta blocker such as Coreg.                                               Lesleigh Noe, M.D.    HWS/MEDQ  D:  01/01/2003  T:  01/01/2003  Job:  [id number]  Chales Salmon. Abigail Miyamoto, M.D.  [address]  [place]  [place] [address]  Fax: [phone]   cc:   Chales Salmon. Abigail Miyamoto, M.D.  [address]  [place]  [place] [address]  Fax: [phone]

## 2010-11-11 NOTE — Discharge Summary (Signed)
[name], [name]                ACCOUNT NO.:  [id number]   MEDICAL RECORD NO.:  [id number]          PATIENT TYPE:  INP   LOCATION:  1412                         FACILITY:  [facility]   PHYSICIAN:  Frank Gardener, MD    DATE OF BIRTH:  Jun 24, 1951   DATE OF ADMISSION:  04/15/2006  DATE OF DISCHARGE:  04/26/2006                                 DISCHARGE SUMMARY   PRIMARY CARE PHYSICIAN:  Primary care physician:  [name]. [name], M.D.;  Oncologist:  Frank Matte, MD; Cardiologist:  [name], M.D.   DISCHARGE DIAGNOSES:  1. Paroxysmal atrial fibrillation.  2. Orthostatic hypotension.  3. 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  4. Leukocytosis.  5. Weakness.  6. Diabetes mellitus.  7. Hypertension.   DISCHARGE MEDICATIONS:  Discharge medications include:  1. Coreg 18.75 mg p.o. twice daily.  2. Glipizide 10 mg p.o. once daily.  3. Metformin 1,000 mg p.o. twice daily.  4. Multivitamin one tablet p.o. once daily.  5. Avandia 8 mg p.o. once daily.  6. Zocor 40 mg p.o. once daily.  7. Phenergan 12.5 to 25 mg p.o. q.4h. p.r.n.  8. Sucralfate 1 gram p.o. four times daily.   CONSULTATIONS:  Cardiology consult with Dr. Verdis Gordon.   PROCEDURE:  Echocardiogram showed ejection fraction of more than 55% with  normal significant valvular abnormalities and no wall motion abnormalities.   FOLLOWUP:  1. Dr. Henrine Gordon.  2. Dr. Lajuana Gordon.  3. Dr. Verdis Gordon.   CONDITION ON DISCHARGE:  The patient is feeling better.  His dizziness is  improved.   HOSPITAL COURSE BY MEDICAL PROBLEM:  PROBLEM #1:  ORTHOSTATIC HYPOTENSION:  This patient is a 60 year old Caucasian male who came into the hospital  complaining of severe weakness and dizziness, especially when he stands up.  In the emergency room he was found to have orthostatic hypotension.  He was  admitted to the floor.  He was given IV fluids and his blood pressure  medications were stopped initially.   The patient continued to have  orthostatic hypotension regardless of the excessive IV fluids that he  received.  Echocardiogram was done on him and showed ejection fraction of  more than 55% with no wall motion abnormalities.  A cardiology consultation  was done and at that time he developed atrial fibrillation and then  transferred to telemetry.  Three troponin's were done on him and were found  to be negative.  He was started on sotalol by cardiology and that was  discontinued later on.  His atrial fibrillation resolved in the hospital and  his Coreg was increased to 18.75 twice daily.  Other blood pressure  medications were discontinued including hydrochlorothiazide and [name].  Currently his orthostasis resolved and will be followed by Dr. Verdis Gordon  as an outpatient.   PROBLEM #2:  PAROXYSMAL ATRIAL FIBRILLATION:  During his hospitalization he  developed an episode of atrial fibrillation with heart rate of 140.  He was  transferred to telemetry.  Three sets of troponin's were done on him and  they came  out to be negative.  The patient was put on Lovenox during the  hospitalization.  His atrial fibrillation resolved within 24 hours.  No need  for further anticoagulation as per cardiology and his Coreg was increased to  18.75 mg twice daily for rate control.   PROBLEM #3:  HYPERTENSION:  As mentioned, this patient had orthostatic  hypotension during the hospitalization.  Hydrochlorothiazide and [name]  were discontinued and Coreg was increased.   PROBLEM #4:  SQUAMOUS CELL CARCINOMA OF TONSIL AND SOFT PALATE:  The patient  received chemotherapy and he also had a feeding tube in preparation for his  radiation.  He will be followed by Dr. Si Gordon as an outpatient some  time next week for evaluation of his radiation.   PROBLEM #5:  LEUKOCYTOSIS:  This is secondary to his steroids and that was  resolved.   PROBLEM #6:  WEAKNESS:  Again, this is secondary to multiple  factors  including his orthostatic hypotension in addition to his chemotherapy.  The  patient is back to baseline.   PROBLEM #7:  DIABETES MELLITUS:  Same medications were continued.   Discharge time is 40 minutes.      Frank Gardener, MD  Electronically Signed     NAE/MEDQ  D:  04/26/2006  T:  04/26/2006  Job:  [id number]   cc:   [name]. [name], M.D.  Fax: [phone]   Frank Matte, MD  Fax: [phone]   [name], M.D.  Fax: [phone]

## 2011-08-10 DIAGNOSIS — E11329 Type 2 diabetes mellitus with mild nonproliferative diabetic retinopathy without macular edema: Secondary | ICD-10-CM | POA: Diagnosis not present

## 2011-08-10 DIAGNOSIS — E1139 Type 2 diabetes mellitus with other diabetic ophthalmic complication: Secondary | ICD-10-CM | POA: Diagnosis not present

## 2011-11-20 ENCOUNTER — Encounter (HOSPITAL_COMMUNITY): Payer: Self-pay | Admitting: Emergency Medicine

## 2011-11-20 ENCOUNTER — Emergency Department (HOSPITAL_COMMUNITY)
Admission: EM | Admit: 2011-11-20 | Discharge: 2011-11-20 | Disposition: A | Discharge status: Home / Self Care | Payer: Medicare Other | Attending: Emergency Medicine | Admitting: Emergency Medicine

## 2011-11-20 DIAGNOSIS — E119 Type 2 diabetes mellitus without complications: Secondary | ICD-10-CM | POA: Diagnosis not present

## 2011-11-20 DIAGNOSIS — I10 Essential (primary) hypertension: Secondary | ICD-10-CM | POA: Insufficient documentation

## 2011-11-20 DIAGNOSIS — M7989 Other specified soft tissue disorders: Secondary | ICD-10-CM | POA: Insufficient documentation

## 2011-11-20 DIAGNOSIS — M79609 Pain in unspecified limb: Secondary | ICD-10-CM | POA: Diagnosis not present

## 2011-11-20 DIAGNOSIS — R609 Edema, unspecified: Secondary | ICD-10-CM | POA: Diagnosis not present

## 2011-11-20 DIAGNOSIS — Z79899 Other long term (current) drug therapy: Secondary | ICD-10-CM | POA: Insufficient documentation

## 2011-11-20 LAB — BASIC METABOLIC PANEL
CO2: 23 mEq/L (ref 19–32)
Calcium: 9.3 mg/dL (ref 8.4–10.5)
Chloride: 99 mEq/L (ref 96–112)
Creatinine, Ser: 1.25 mg/dL (ref 0.50–1.35)
Glucose, Bld: 119 mg/dL — ABNORMAL HIGH (ref 70–99)
Sodium: 134 mEq/L — ABNORMAL LOW (ref 135–145)

## 2011-11-20 LAB — DIFFERENTIAL
Basophils Absolute: 0 10*3/uL (ref 0.0–0.1)
Eosinophils Relative: 3 % (ref 0–5)
Lymphocytes Relative: 14 % (ref 12–46)
Lymphs Abs: 0.9 10*3/uL (ref 0.7–4.0)
Monocytes Absolute: 1 10*3/uL (ref 0.1–1.0)
Neutro Abs: 4.7 10*3/uL (ref 1.7–7.7)

## 2011-11-20 LAB — CBC
HCT: 38.5 % — ABNORMAL LOW (ref 39.0–52.0)
MCV: 85 fL (ref 78.0–100.0)
RBC: 4.53 MIL/uL (ref 4.22–5.81)
RDW: 13.4 % (ref 11.5–15.5)
WBC: 6.8 10*3/uL (ref 4.0–10.5)

## 2011-11-20 MED ORDER — CEPHALEXIN 500 MG PO CAPS: 500.0000 mg | ORAL_CAPSULE | Freq: Four times a day (QID) | ORAL | Status: AC

## 2011-11-20 NOTE — ED Provider Notes (Signed)
History     CSN: [id number]  Arrival date & time 11/20/11  4098   First MD Initiated Contact with Patient 11/20/11 1008      Chief Complaint  Patient presents with  . Leg Swelling    (Consider location/radiation/quality/duration/timing/severity/associated sxs/prior treatment) HPI Comments: [name] is a 61 y.o. Male who has had 3 days of left lower leg swelling and redness that improves when he elevates it at night. No trauma to the leg. No associated weakness, dizziness, nausea, vomiting, chest pain, shortness of breath, fever, or chills. No similar problem in the past. He saw his primary care doctor today and was sent here for further evaluation.  The history is provided by the patient.    Past Medical History  Diagnosis Date  . 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 Hypertension   . Diabetes mellitus   . Hypotension     Past Surgical History  Procedure Date  . Breast surgery     History reviewed. No pertinent family history.  History  Substance Use Topics  . Smoking status: Never Smoker   . Smokeless tobacco: Not on file  . Alcohol Use: No      Review of Systems  All other systems reviewed and are negative.    Allergies  Review of patient's allergies indicates no known allergies.  Home Medications   Current Outpatient Rx  Name Route Sig Dispense Refill  . ASPIRIN EC 81 MG PO TBEC Oral Take 81 mg by mouth daily.    Marland Kitchen DOCUSATE SODIUM 100 MG PO CAPS Oral Take 100 mg by mouth 2 (two) times daily as needed. For stool softener.    Marland Kitchen LEVOTHYROXINE SODIUM 112 MCG PO TABS Oral Take 112 mcg by mouth daily.    Marland Kitchen LIOTHYRONINE SODIUM 5 MCG PO TABS Oral Take 5 mcg by mouth daily.    Marland Kitchen METFORMIN HCL 1000 MG PO TABS Oral Take 1,000 mg by mouth 2 (two) times daily with a meal.    . METOPROLOL SUCCINATE ER 25 MG PO TB24 Oral Take 12.5 mg by mouth daily.    . ADULT MULTIVITAMIN W/MINERALS CH Oral Take 1 tablet by mouth daily.    Marland Kitchen OMEPRAZOLE 20 MG  PO CPDR Oral Take 20 mg by mouth daily.    . [name] HCL 40 MG PO TABS Oral Take 20 mg by mouth 2 (two) times daily.    . CEPHALEXIN 500 MG PO CAPS Oral Take 1 capsule (500 mg total) by mouth 4 (four) times daily. 28 capsule 0    BP 165/72  Pulse 58  Temp(Src) 98 F (36.7 C) (Oral)  Resp 14  SpO2 100%  Physical Exam  Nursing note and vitals reviewed. Constitutional: He is oriented to person, place, and time. He appears well-developed and well-nourished.  HENT:  Head: Normocephalic and atraumatic.  Right Ear: External ear normal.  Left Ear: External ear normal.  Eyes: Conjunctivae and EOM are normal. Pupils are equal, round, and reactive to light.  Neck: Normal range of motion and phonation normal. Neck supple.  Cardiovascular: Normal rate, regular rhythm, normal heart sounds and intact distal pulses.   Pulmonary/Chest: Effort normal and breath sounds normal. He exhibits no bony tenderness.  Abdominal: Soft. Normal appearance. There is no tenderness.  Musculoskeletal: Normal range of motion.       Isolated left lower leg edema, without significant tenderness. Negative Homans. No popliteal mass or tenderness. Moderate associated erythema of the anterior left  lower leg; the redness is indistinct.  Neurological: He is alert and oriented to person, place, and time. He has normal strength. No cranial nerve deficit or sensory deficit. He exhibits normal muscle tone. Coordination normal.  Skin: Skin is warm, dry and intact.  Psychiatric: He has a normal mood and affect. His behavior is normal. Judgment and thought content normal.    ED Course  Procedures (including critical care time)  Labs Reviewed  CBC - Abnormal; Notable for the following:    HCT 38.5 (*)    All other components within normal limits  DIFFERENTIAL - Abnormal; Notable for the following:    Monocytes Relative 14 (*)    All other components within normal limits  BASIC METABOLIC PANEL - Abnormal; Notable for the  following:    Sodium 134 (*)    Glucose, Bld 119 (*)    GFR calc non Af Amer 61 (*)    GFR calc Af Amer 71 (*)    All other components within normal limits   No results found.   1. Leg swelling       MDM  Left leg swelling, and erythema, without apparent DVT. Differential diagnosis includes cellulitis, and superficial thrombophlebitis. Doubt metabolic instability, serious bacterial infection or impending vascular collapse; the patient is stable for discharge.   Plan: Home Medications- Keflex; Home Treatments- Elevation; Recommended follow up- PCP f/u in 3 days        Flint Melter, MD 11/20/11 1245

## 2011-11-20 NOTE — Progress Notes (Signed)
*  PRELIMINARY RESULTS* Vascular Ultrasound Left lower extremity venous duplex has been completed.  Preliminary findings: Left= No evidence of DVT or baker's cyst.  Farrel Demark, RDMS 11/20/2011, 10:59 AM

## 2011-11-20 NOTE — ED Notes (Signed)
Pt states that he has had L leg swelling since Friday.  States swelling has not gotten worse since Friday.  Pt states he has no leg pain, but ankle feels stiff when edema gets bad.  Pt denies injury to leg.  No broken skin noted.  Pedal pulse strong.  L leg reddened and swollen below knee.  Mild pitting edema on L leg.  Pt state he has pain when he moves his ankle.  1 inch bruised area noted on lateral L ankle.

## 2011-11-20 NOTE — Discharge Instructions (Signed)
Elevate the legs above your heart as much as possible for 3 days. Use a heating pad or moist compress on the left lower leg. Several times a day for 30 or 40 minutes. Start the antibiotic prescription today. Return here if needed for problems.   Edema Edema is an abnormal build-up of fluids in tissues. Because this is partly dependent on gravity (water flows to the lowest place), it is more common in the leg sand thighs (lower extremities). It is also common in the looser tissues, like around the eyes. Painless swelling of the feet and ankles is common and increases as a person ages. It may affect both legs and may include the calves or even thighs. When squeezed, the fluid may move out of the affected area and may leave a dent for a few moments. CAUSES   Prolonged standing or sitting in one place for extended periods of time. Movement helps pump tissue fluid into the veins, and absence of movement prevents this, resulting in edema.   Varicose veins. The valves in the veins do not work as well as they should. This causes fluid to leak into the tissues.   Fluid and salt overload.   Injury, burn, or surgery to the leg, ankle, or foot, may damage veins and allow fluid to leak out.   Sunburn damages vessels. Leaky vessels allow fluid to go out into the sunburned tissues.   Allergies (from insect bites or stings, medications or chemicals) cause swelling by allowing vessels to become leaky.   Protein in the blood helps keep fluid in your vessels. Low protein, as in malnutrition, allows fluid to leak out.   Hormonal changes, including pregnancy and menstruation, cause fluid retention. This fluid may leak out of vessels and cause edema.   Medications that cause fluid retention. Examples are sex hormones, blood pressure medications, steroid treatment, or anti-depressants.   Some illnesses cause edema, especially heart failure, kidney disease, or liver disease.   Surgery that cuts veins or lymph  nodes, such as surgery done for the heart or for breast cancer, may result in edema.  DIAGNOSIS  Your caregiver is usually easily able to determine what is causing your swelling (edema) by simply asking what is wrong (getting a history) and examining you (doing a physical). Sometimes x-rays, EKG (electrocardiogram or heart tracing), and blood work may be done to evaluate for underlying medical illness. TREATMENT  General treatment includes:  Leg elevation (or elevation of the affected body part).   Restriction of fluid intake.   Prevention of fluid overload.   Compression of the affected body part. Compression with elastic bandages or support stockings squeezes the tissues, preventing fluid from entering and forcing it back into the blood vessels.   Diuretics (also called water pills or fluid pills) pull fluid out of your body in the form of increased urination. These are effective in reducing the swelling, but can have side effects and must be used only under your caregiver's supervision. Diuretics are appropriate only for some types of edema.  The specific treatment can be directed at any underlying causes discovered. Heart, liver, or kidney disease should be treated appropriately. HOME CARE INSTRUCTIONS   Elevate the legs (or affected body part) above the level of the heart, while lying down.   Avoid sitting or standing still for prolonged periods of time.   Avoid putting anything directly under the knees when lying down, and do not wear constricting clothing or garters on the upper legs.  Exercising the legs causes the fluid to work back into the veins and lymphatic channels. This may help the swelling go down.   The pressure applied by elastic bandages or support stockings can help reduce ankle swelling.   A low-salt diet may help reduce fluid retention and decrease the ankle swelling.   Take any medications exactly as prescribed.  SEEK MEDICAL CARE IF:  Your edema is not  responding to recommended treatments. SEEK IMMEDIATE MEDICAL CARE IF:   You develop shortness of breath or chest pain.   You cannot breathe when you lay down; or if, while lying down, you have to get up and go to the window to get your breath.   You are having increasing swelling without relief from treatment.   You develop a fever over 102 F (38.9 C).   You develop pain or redness in the areas that are swollen.   Tell your caregiver right away if you have gained 3 lb/1.4 kg in 1 day or 5 lb/2.3 kg in a week.  MAKE SURE YOU:   Understand these instructions.   Will watch your condition.   Will get help right away if you are not doing well or get worse.  Document Released: 06/12/2005 Document Revised: 06/01/2011 Document Reviewed: 01/29/2008 [facility] Patient Information 2012 [place], [place].

## 2011-11-24 DIAGNOSIS — L03119 Cellulitis of unspecified part of limb: Secondary | ICD-10-CM | POA: Diagnosis not present

## 2011-12-06 DIAGNOSIS — E7800 Pure hypercholesterolemia, unspecified: Secondary | ICD-10-CM | POA: Diagnosis not present

## 2011-12-06 DIAGNOSIS — I10 Essential (primary) hypertension: Secondary | ICD-10-CM | POA: Diagnosis not present

## 2011-12-06 DIAGNOSIS — E039 Hypothyroidism, unspecified: Secondary | ICD-10-CM | POA: Diagnosis not present

## 2011-12-06 DIAGNOSIS — R071 Chest pain on breathing: Secondary | ICD-10-CM | POA: Diagnosis not present

## 2011-12-06 DIAGNOSIS — L989 Disorder of the skin and subcutaneous tissue, unspecified: Secondary | ICD-10-CM | POA: Diagnosis not present

## 2011-12-27 DIAGNOSIS — Z125 Encounter for screening for malignant neoplasm of prostate: Secondary | ICD-10-CM | POA: Diagnosis not present

## 2011-12-27 DIAGNOSIS — E119 Type 2 diabetes mellitus without complications: Secondary | ICD-10-CM | POA: Diagnosis not present

## 2011-12-27 DIAGNOSIS — I10 Essential (primary) hypertension: Secondary | ICD-10-CM | POA: Diagnosis not present

## 2011-12-29 DIAGNOSIS — I4891 Unspecified atrial fibrillation: Secondary | ICD-10-CM | POA: Diagnosis not present

## 2011-12-29 DIAGNOSIS — I499 Cardiac arrhythmia, unspecified: Secondary | ICD-10-CM | POA: Diagnosis not present

## 2012-01-17 DIAGNOSIS — I4891 Unspecified atrial fibrillation: Secondary | ICD-10-CM | POA: Diagnosis not present

## 2012-01-17 DIAGNOSIS — I2510 Atherosclerotic heart disease of native coronary artery without angina pectoris: Secondary | ICD-10-CM | POA: Diagnosis not present

## 2012-01-17 DIAGNOSIS — I10 Essential (primary) hypertension: Secondary | ICD-10-CM | POA: Diagnosis not present

## 2012-01-23 DIAGNOSIS — I4891 Unspecified atrial fibrillation: Secondary | ICD-10-CM | POA: Diagnosis not present

## 2012-01-24 DIAGNOSIS — I10 Essential (primary) hypertension: Secondary | ICD-10-CM | POA: Diagnosis not present

## 2012-01-24 DIAGNOSIS — I4891 Unspecified atrial fibrillation: Secondary | ICD-10-CM | POA: Diagnosis not present

## 2012-01-24 DIAGNOSIS — Z79899 Other long term (current) drug therapy: Secondary | ICD-10-CM | POA: Diagnosis not present

## 2012-01-31 DIAGNOSIS — I10 Essential (primary) hypertension: Secondary | ICD-10-CM | POA: Diagnosis not present

## 2012-01-31 DIAGNOSIS — I2510 Atherosclerotic heart disease of native coronary artery without angina pectoris: Secondary | ICD-10-CM | POA: Diagnosis not present

## 2012-01-31 DIAGNOSIS — I4891 Unspecified atrial fibrillation: Secondary | ICD-10-CM | POA: Diagnosis not present

## 2012-02-29 DIAGNOSIS — I10 Essential (primary) hypertension: Secondary | ICD-10-CM | POA: Diagnosis not present

## 2012-02-29 DIAGNOSIS — I4891 Unspecified atrial fibrillation: Secondary | ICD-10-CM | POA: Diagnosis not present

## 2012-02-29 DIAGNOSIS — I2510 Atherosclerotic heart disease of native coronary artery without angina pectoris: Secondary | ICD-10-CM | POA: Diagnosis not present

## 2012-03-04 DIAGNOSIS — L03319 Cellulitis of trunk, unspecified: Secondary | ICD-10-CM | POA: Diagnosis not present

## 2012-03-04 DIAGNOSIS — L02219 Cutaneous abscess of trunk, unspecified: Secondary | ICD-10-CM | POA: Diagnosis not present

## 2012-03-11 DIAGNOSIS — Z7901 Long term (current) use of anticoagulants: Secondary | ICD-10-CM | POA: Diagnosis not present

## 2012-03-11 DIAGNOSIS — I4891 Unspecified atrial fibrillation: Secondary | ICD-10-CM | POA: Diagnosis not present

## 2012-03-15 DIAGNOSIS — I4891 Unspecified atrial fibrillation: Secondary | ICD-10-CM | POA: Diagnosis not present

## 2012-03-15 DIAGNOSIS — Z7901 Long term (current) use of anticoagulants: Secondary | ICD-10-CM | POA: Diagnosis not present

## 2012-03-22 DIAGNOSIS — I4891 Unspecified atrial fibrillation: Secondary | ICD-10-CM | POA: Diagnosis not present

## 2012-03-22 DIAGNOSIS — Z7901 Long term (current) use of anticoagulants: Secondary | ICD-10-CM | POA: Diagnosis not present

## 2012-03-26 DIAGNOSIS — E118 Type 2 diabetes mellitus with unspecified complications: Secondary | ICD-10-CM | POA: Diagnosis not present

## 2012-03-26 DIAGNOSIS — Z79899 Other long term (current) drug therapy: Secondary | ICD-10-CM | POA: Diagnosis not present

## 2012-03-26 DIAGNOSIS — I10 Essential (primary) hypertension: Secondary | ICD-10-CM | POA: Diagnosis not present

## 2012-03-26 DIAGNOSIS — Z23 Encounter for immunization: Secondary | ICD-10-CM | POA: Diagnosis not present

## 2012-03-26 DIAGNOSIS — Z7901 Long term (current) use of anticoagulants: Secondary | ICD-10-CM | POA: Diagnosis not present

## 2012-03-26 DIAGNOSIS — I4891 Unspecified atrial fibrillation: Secondary | ICD-10-CM | POA: Diagnosis not present

## 2012-03-27 DIAGNOSIS — E11329 Type 2 diabetes mellitus with mild nonproliferative diabetic retinopathy without macular edema: Secondary | ICD-10-CM | POA: Diagnosis not present

## 2012-03-29 DIAGNOSIS — Z7901 Long term (current) use of anticoagulants: Secondary | ICD-10-CM | POA: Diagnosis not present

## 2012-03-29 DIAGNOSIS — I4891 Unspecified atrial fibrillation: Secondary | ICD-10-CM | POA: Diagnosis not present

## 2012-04-08 DIAGNOSIS — I4891 Unspecified atrial fibrillation: Secondary | ICD-10-CM | POA: Diagnosis not present

## 2012-04-08 DIAGNOSIS — Z7901 Long term (current) use of anticoagulants: Secondary | ICD-10-CM | POA: Diagnosis not present

## 2012-04-29 DIAGNOSIS — I4891 Unspecified atrial fibrillation: Secondary | ICD-10-CM | POA: Diagnosis not present

## 2012-04-29 DIAGNOSIS — Z7901 Long term (current) use of anticoagulants: Secondary | ICD-10-CM | POA: Diagnosis not present

## 2012-05-27 DIAGNOSIS — I4891 Unspecified atrial fibrillation: Secondary | ICD-10-CM | POA: Diagnosis not present

## 2012-05-27 DIAGNOSIS — Z7901 Long term (current) use of anticoagulants: Secondary | ICD-10-CM | POA: Diagnosis not present

## 2012-06-24 DIAGNOSIS — Z7901 Long term (current) use of anticoagulants: Secondary | ICD-10-CM | POA: Diagnosis not present

## 2012-06-24 DIAGNOSIS — I4891 Unspecified atrial fibrillation: Secondary | ICD-10-CM | POA: Diagnosis not present

## 2012-07-22 DIAGNOSIS — Z7901 Long term (current) use of anticoagulants: Secondary | ICD-10-CM | POA: Diagnosis not present

## 2012-07-22 DIAGNOSIS — I4891 Unspecified atrial fibrillation: Secondary | ICD-10-CM | POA: Diagnosis not present

## 2012-08-01 DIAGNOSIS — E119 Type 2 diabetes mellitus without complications: Secondary | ICD-10-CM | POA: Diagnosis not present

## 2012-08-01 DIAGNOSIS — Z7901 Long term (current) use of anticoagulants: Secondary | ICD-10-CM | POA: Diagnosis not present

## 2012-08-01 DIAGNOSIS — I4891 Unspecified atrial fibrillation: Secondary | ICD-10-CM | POA: Diagnosis not present

## 2012-08-05 DIAGNOSIS — I4891 Unspecified atrial fibrillation: Secondary | ICD-10-CM | POA: Diagnosis not present

## 2012-08-05 DIAGNOSIS — Z7901 Long term (current) use of anticoagulants: Secondary | ICD-10-CM | POA: Diagnosis not present

## 2012-08-06 DIAGNOSIS — I2510 Atherosclerotic heart disease of native coronary artery without angina pectoris: Secondary | ICD-10-CM | POA: Diagnosis not present

## 2012-08-06 DIAGNOSIS — I4891 Unspecified atrial fibrillation: Secondary | ICD-10-CM | POA: Diagnosis not present

## 2012-08-06 DIAGNOSIS — I10 Essential (primary) hypertension: Secondary | ICD-10-CM | POA: Diagnosis not present

## 2012-09-03 DIAGNOSIS — I2510 Atherosclerotic heart disease of native coronary artery without angina pectoris: Secondary | ICD-10-CM | POA: Diagnosis not present

## 2012-09-03 DIAGNOSIS — I4891 Unspecified atrial fibrillation: Secondary | ICD-10-CM | POA: Diagnosis not present

## 2012-09-03 DIAGNOSIS — Z7901 Long term (current) use of anticoagulants: Secondary | ICD-10-CM | POA: Diagnosis not present

## 2012-09-03 DIAGNOSIS — I10 Essential (primary) hypertension: Secondary | ICD-10-CM | POA: Diagnosis not present

## 2012-09-25 DIAGNOSIS — E11329 Type 2 diabetes mellitus with mild nonproliferative diabetic retinopathy without macular edema: Secondary | ICD-10-CM | POA: Diagnosis not present

## 2012-10-02 DIAGNOSIS — Z7901 Long term (current) use of anticoagulants: Secondary | ICD-10-CM | POA: Diagnosis not present

## 2012-10-02 DIAGNOSIS — I4891 Unspecified atrial fibrillation: Secondary | ICD-10-CM | POA: Diagnosis not present

## 2012-10-18 DIAGNOSIS — Z7901 Long term (current) use of anticoagulants: Secondary | ICD-10-CM | POA: Diagnosis not present

## 2012-10-18 DIAGNOSIS — I4891 Unspecified atrial fibrillation: Secondary | ICD-10-CM | POA: Diagnosis not present

## 2012-11-13 DIAGNOSIS — I4891 Unspecified atrial fibrillation: Secondary | ICD-10-CM | POA: Diagnosis not present

## 2012-11-13 DIAGNOSIS — Z7901 Long term (current) use of anticoagulants: Secondary | ICD-10-CM | POA: Diagnosis not present

## 2012-12-11 ENCOUNTER — Other Ambulatory Visit: Payer: Self-pay | Admitting: Dermatology

## 2012-12-11 DIAGNOSIS — I4891 Unspecified atrial fibrillation: Secondary | ICD-10-CM | POA: Diagnosis not present

## 2012-12-11 DIAGNOSIS — L570 Actinic keratosis: Secondary | ICD-10-CM | POA: Diagnosis not present

## 2012-12-11 DIAGNOSIS — Z7901 Long term (current) use of anticoagulants: Secondary | ICD-10-CM | POA: Diagnosis not present

## 2012-12-11 DIAGNOSIS — D485 Neoplasm of uncertain behavior of skin: Secondary | ICD-10-CM | POA: Diagnosis not present

## 2012-12-11 DIAGNOSIS — I781 Nevus, non-neoplastic: Secondary | ICD-10-CM | POA: Diagnosis not present

## 2012-12-11 DIAGNOSIS — L820 Inflamed seborrheic keratosis: Secondary | ICD-10-CM | POA: Diagnosis not present

## 2012-12-25 DIAGNOSIS — Z7901 Long term (current) use of anticoagulants: Secondary | ICD-10-CM | POA: Diagnosis not present

## 2012-12-25 DIAGNOSIS — I4891 Unspecified atrial fibrillation: Secondary | ICD-10-CM | POA: Diagnosis not present

## 2013-01-16 DIAGNOSIS — E7800 Pure hypercholesterolemia, unspecified: Secondary | ICD-10-CM | POA: Diagnosis not present

## 2013-01-16 DIAGNOSIS — I10 Essential (primary) hypertension: Secondary | ICD-10-CM | POA: Diagnosis not present

## 2013-01-16 DIAGNOSIS — E039 Hypothyroidism, unspecified: Secondary | ICD-10-CM | POA: Diagnosis not present

## 2013-01-16 DIAGNOSIS — E118 Type 2 diabetes mellitus with unspecified complications: Secondary | ICD-10-CM | POA: Diagnosis not present

## 2013-01-22 DIAGNOSIS — I4891 Unspecified atrial fibrillation: Secondary | ICD-10-CM | POA: Diagnosis not present

## 2013-01-22 DIAGNOSIS — Z7901 Long term (current) use of anticoagulants: Secondary | ICD-10-CM | POA: Diagnosis not present

## 2013-02-19 DIAGNOSIS — Z7901 Long term (current) use of anticoagulants: Secondary | ICD-10-CM | POA: Diagnosis not present

## 2013-02-19 DIAGNOSIS — I4891 Unspecified atrial fibrillation: Secondary | ICD-10-CM | POA: Diagnosis not present

## 2013-03-05 DIAGNOSIS — Z7901 Long term (current) use of anticoagulants: Secondary | ICD-10-CM | POA: Diagnosis not present

## 2013-03-05 DIAGNOSIS — I2510 Atherosclerotic heart disease of native coronary artery without angina pectoris: Secondary | ICD-10-CM | POA: Diagnosis not present

## 2013-03-05 DIAGNOSIS — E118 Type 2 diabetes mellitus with unspecified complications: Secondary | ICD-10-CM | POA: Diagnosis not present

## 2013-03-05 DIAGNOSIS — I951 Orthostatic hypotension: Secondary | ICD-10-CM | POA: Diagnosis not present

## 2013-03-05 DIAGNOSIS — I10 Essential (primary) hypertension: Secondary | ICD-10-CM | POA: Diagnosis not present

## 2013-03-05 DIAGNOSIS — I4891 Unspecified atrial fibrillation: Secondary | ICD-10-CM | POA: Diagnosis not present

## 2013-03-26 DIAGNOSIS — E1139 Type 2 diabetes mellitus with other diabetic ophthalmic complication: Secondary | ICD-10-CM | POA: Diagnosis not present

## 2013-03-26 DIAGNOSIS — H35039 Hypertensive retinopathy, unspecified eye: Secondary | ICD-10-CM | POA: Diagnosis not present

## 2013-03-26 DIAGNOSIS — E11339 Type 2 diabetes mellitus with moderate nonproliferative diabetic retinopathy without macular edema: Secondary | ICD-10-CM | POA: Diagnosis not present

## 2013-04-02 ENCOUNTER — Ambulatory Visit (INDEPENDENT_AMBULATORY_CARE_PROVIDER_SITE_OTHER): Payer: Medicare Other | Admitting: Pharmacist

## 2013-04-02 DIAGNOSIS — I4891 Unspecified atrial fibrillation: Secondary | ICD-10-CM | POA: Diagnosis not present

## 2013-04-02 DIAGNOSIS — I480 Paroxysmal atrial fibrillation: Secondary | ICD-10-CM | POA: Insufficient documentation

## 2013-04-02 HISTORY — DX: Unspecified atrial fibrillation: I48.91

## 2013-04-02 LAB — POCT INR: INR: 3.1

## 2013-04-09 DIAGNOSIS — L570 Actinic keratosis: Secondary | ICD-10-CM | POA: Diagnosis not present

## 2013-05-01 ENCOUNTER — Ambulatory Visit (INDEPENDENT_AMBULATORY_CARE_PROVIDER_SITE_OTHER): Payer: Medicare Other | Admitting: Pharmacist

## 2013-05-01 DIAGNOSIS — I4891 Unspecified atrial fibrillation: Secondary | ICD-10-CM | POA: Diagnosis not present

## 2013-05-14 ENCOUNTER — Telehealth: Payer: Self-pay

## 2013-05-14 ENCOUNTER — Telehealth: Payer: Self-pay | Admitting: Interventional Cardiology

## 2013-05-14 MED ORDER — METOPROLOL SUCCINATE ER 50 MG PO TB24: ORAL_TABLET | ORAL | Status: DC

## 2013-05-14 NOTE — Telephone Encounter (Signed)
New message     Refill metoprolol-----[facility]/[address]------out of medication

## 2013-05-14 NOTE — Telephone Encounter (Signed)
Done

## 2013-05-19 NOTE — Telephone Encounter (Signed)
Refill

## 2013-05-29 ENCOUNTER — Ambulatory Visit (INDEPENDENT_AMBULATORY_CARE_PROVIDER_SITE_OTHER): Payer: Medicare Other | Admitting: General Practice

## 2013-05-29 DIAGNOSIS — I4891 Unspecified atrial fibrillation: Secondary | ICD-10-CM

## 2013-06-17 ENCOUNTER — Telehealth: Payer: Self-pay | Admitting: *Deleted

## 2013-06-17 NOTE — Telephone Encounter (Signed)
Patient needs warfarin refill to be sent to [facility] on [address]. Thanks, MI

## 2013-06-20 MED ORDER — WARFARIN SODIUM 5 MG PO TABS: ORAL_TABLET | ORAL | Status: DC

## 2013-06-20 NOTE — Telephone Encounter (Signed)
Prescription sent

## 2013-07-08 ENCOUNTER — Ambulatory Visit (INDEPENDENT_AMBULATORY_CARE_PROVIDER_SITE_OTHER): Payer: Medicare Other | Admitting: Pharmacist

## 2013-07-08 DIAGNOSIS — I4891 Unspecified atrial fibrillation: Secondary | ICD-10-CM | POA: Diagnosis not present

## 2013-07-08 LAB — POCT INR: INR: 4.1

## 2013-07-22 ENCOUNTER — Encounter: Payer: Self-pay | Admitting: *Deleted

## 2013-07-22 ENCOUNTER — Ambulatory Visit (INDEPENDENT_AMBULATORY_CARE_PROVIDER_SITE_OTHER): Payer: Medicare Other | Admitting: *Deleted

## 2013-07-22 DIAGNOSIS — Z5181 Encounter for therapeutic drug level monitoring: Secondary | ICD-10-CM | POA: Diagnosis not present

## 2013-07-22 DIAGNOSIS — I4891 Unspecified atrial fibrillation: Secondary | ICD-10-CM | POA: Diagnosis not present

## 2013-07-22 HISTORY — DX: Encounter for therapeutic drug level monitoring: Z51.81

## 2013-07-22 LAB — POCT INR: INR: 2

## 2013-08-06 ENCOUNTER — Ambulatory Visit (INDEPENDENT_AMBULATORY_CARE_PROVIDER_SITE_OTHER): Payer: Medicare Other | Admitting: *Deleted

## 2013-08-06 DIAGNOSIS — I4891 Unspecified atrial fibrillation: Secondary | ICD-10-CM | POA: Diagnosis not present

## 2013-08-06 DIAGNOSIS — Z5181 Encounter for therapeutic drug level monitoring: Secondary | ICD-10-CM

## 2013-08-06 LAB — POCT INR: INR: 2

## 2013-08-27 ENCOUNTER — Ambulatory Visit (INDEPENDENT_AMBULATORY_CARE_PROVIDER_SITE_OTHER): Payer: Medicare Other | Admitting: Pharmacist

## 2013-08-27 DIAGNOSIS — I4891 Unspecified atrial fibrillation: Secondary | ICD-10-CM

## 2013-08-27 DIAGNOSIS — Z5181 Encounter for therapeutic drug level monitoring: Secondary | ICD-10-CM

## 2013-08-27 LAB — POCT INR: INR: 2.1

## 2013-09-20 DIAGNOSIS — M549 Dorsalgia, unspecified: Secondary | ICD-10-CM | POA: Diagnosis not present

## 2013-09-24 ENCOUNTER — Ambulatory Visit (INDEPENDENT_AMBULATORY_CARE_PROVIDER_SITE_OTHER): Payer: Medicare Other | Admitting: Pharmacist

## 2013-09-24 DIAGNOSIS — E11339 Type 2 diabetes mellitus with moderate nonproliferative diabetic retinopathy without macular edema: Secondary | ICD-10-CM | POA: Diagnosis not present

## 2013-09-24 DIAGNOSIS — I4891 Unspecified atrial fibrillation: Secondary | ICD-10-CM | POA: Diagnosis not present

## 2013-09-24 DIAGNOSIS — H35039 Hypertensive retinopathy, unspecified eye: Secondary | ICD-10-CM | POA: Diagnosis not present

## 2013-09-24 DIAGNOSIS — Z5181 Encounter for therapeutic drug level monitoring: Secondary | ICD-10-CM

## 2013-09-24 DIAGNOSIS — E1139 Type 2 diabetes mellitus with other diabetic ophthalmic complication: Secondary | ICD-10-CM | POA: Diagnosis not present

## 2013-09-24 LAB — POCT INR: INR: 2.4

## 2013-10-08 DIAGNOSIS — L821 Other seborrheic keratosis: Secondary | ICD-10-CM | POA: Diagnosis not present

## 2013-10-08 DIAGNOSIS — L570 Actinic keratosis: Secondary | ICD-10-CM | POA: Diagnosis not present

## 2013-10-08 DIAGNOSIS — D239 Other benign neoplasm of skin, unspecified: Secondary | ICD-10-CM | POA: Diagnosis not present

## 2013-10-10 ENCOUNTER — Ambulatory Visit
Admission: RE | Admit: 2013-10-10 | Discharge: 2013-10-10 | Disposition: A | Discharge status: Home / Self Care | Payer: Medicare Other | Source: Ambulatory Visit | Attending: Family Medicine | Admitting: Family Medicine

## 2013-10-10 ENCOUNTER — Other Ambulatory Visit: Payer: Self-pay | Admitting: Family Medicine

## 2013-10-10 DIAGNOSIS — R52 Pain, unspecified: Secondary | ICD-10-CM

## 2013-10-10 DIAGNOSIS — M546 Pain in thoracic spine: Secondary | ICD-10-CM | POA: Diagnosis not present

## 2013-10-10 DIAGNOSIS — M47814 Spondylosis without myelopathy or radiculopathy, thoracic region: Secondary | ICD-10-CM | POA: Diagnosis not present

## 2013-10-13 ENCOUNTER — Other Ambulatory Visit: Payer: Self-pay | Admitting: Family Medicine

## 2013-10-13 DIAGNOSIS — M546 Pain in thoracic spine: Secondary | ICD-10-CM

## 2013-10-14 ENCOUNTER — Ambulatory Visit
Admission: RE | Admit: 2013-10-14 | Discharge: 2013-10-14 | Disposition: A | Discharge status: Home / Self Care | Payer: Medicare Other | Source: Ambulatory Visit | Attending: Family Medicine | Admitting: Family Medicine

## 2013-10-14 DIAGNOSIS — M4804 Spinal stenosis, thoracic region: Secondary | ICD-10-CM | POA: Diagnosis not present

## 2013-10-14 DIAGNOSIS — M546 Pain in thoracic spine: Secondary | ICD-10-CM

## 2013-10-14 DIAGNOSIS — M5124 Other intervertebral disc displacement, thoracic region: Secondary | ICD-10-CM | POA: Diagnosis not present

## 2013-10-27 DIAGNOSIS — IMO0002 Reserved for concepts with insufficient information to code with codable children: Secondary | ICD-10-CM | POA: Diagnosis not present

## 2013-11-05 ENCOUNTER — Ambulatory Visit (INDEPENDENT_AMBULATORY_CARE_PROVIDER_SITE_OTHER): Payer: Medicare Other | Admitting: *Deleted

## 2013-11-05 DIAGNOSIS — I4891 Unspecified atrial fibrillation: Secondary | ICD-10-CM | POA: Diagnosis not present

## 2013-11-05 DIAGNOSIS — Z5181 Encounter for therapeutic drug level monitoring: Secondary | ICD-10-CM | POA: Diagnosis not present

## 2013-11-05 LAB — POCT INR: INR: 2.2

## 2013-12-17 ENCOUNTER — Ambulatory Visit (INDEPENDENT_AMBULATORY_CARE_PROVIDER_SITE_OTHER): Payer: Medicare Other | Admitting: Pharmacist

## 2013-12-17 DIAGNOSIS — I4891 Unspecified atrial fibrillation: Secondary | ICD-10-CM | POA: Diagnosis not present

## 2013-12-17 DIAGNOSIS — Z5181 Encounter for therapeutic drug level monitoring: Secondary | ICD-10-CM | POA: Diagnosis not present

## 2013-12-17 LAB — POCT INR: INR: 2.6

## 2014-01-28 ENCOUNTER — Ambulatory Visit (INDEPENDENT_AMBULATORY_CARE_PROVIDER_SITE_OTHER): Payer: Medicare Other | Admitting: *Deleted

## 2014-01-28 DIAGNOSIS — I4891 Unspecified atrial fibrillation: Secondary | ICD-10-CM

## 2014-01-28 DIAGNOSIS — Z5181 Encounter for therapeutic drug level monitoring: Secondary | ICD-10-CM | POA: Diagnosis not present

## 2014-01-28 LAB — POCT INR: INR: 3.8

## 2014-02-03 ENCOUNTER — Other Ambulatory Visit: Payer: Self-pay | Admitting: Interventional Cardiology

## 2014-02-05 DIAGNOSIS — I4891 Unspecified atrial fibrillation: Secondary | ICD-10-CM | POA: Diagnosis not present

## 2014-02-05 DIAGNOSIS — E039 Hypothyroidism, unspecified: Secondary | ICD-10-CM | POA: Diagnosis not present

## 2014-02-05 DIAGNOSIS — N189 Chronic kidney disease, unspecified: Secondary | ICD-10-CM | POA: Diagnosis not present

## 2014-02-05 DIAGNOSIS — Z1211 Encounter for screening for malignant neoplasm of colon: Secondary | ICD-10-CM | POA: Diagnosis not present

## 2014-02-05 DIAGNOSIS — E7800 Pure hypercholesterolemia, unspecified: Secondary | ICD-10-CM | POA: Diagnosis not present

## 2014-02-05 DIAGNOSIS — E118 Type 2 diabetes mellitus with unspecified complications: Secondary | ICD-10-CM | POA: Diagnosis not present

## 2014-02-12 ENCOUNTER — Ambulatory Visit (INDEPENDENT_AMBULATORY_CARE_PROVIDER_SITE_OTHER): Payer: Medicare Other | Admitting: Pharmacist

## 2014-02-12 DIAGNOSIS — Z5181 Encounter for therapeutic drug level monitoring: Secondary | ICD-10-CM

## 2014-02-12 DIAGNOSIS — I4891 Unspecified atrial fibrillation: Secondary | ICD-10-CM | POA: Diagnosis not present

## 2014-02-12 LAB — POCT INR: INR: 2.7

## 2014-02-19 ENCOUNTER — Encounter: Payer: Self-pay | Admitting: *Deleted

## 2014-03-10 ENCOUNTER — Ambulatory Visit (INDEPENDENT_AMBULATORY_CARE_PROVIDER_SITE_OTHER): Payer: Medicare Other | Admitting: Pharmacist

## 2014-03-10 ENCOUNTER — Encounter: Payer: Self-pay | Admitting: Interventional Cardiology

## 2014-03-10 ENCOUNTER — Ambulatory Visit (INDEPENDENT_AMBULATORY_CARE_PROVIDER_SITE_OTHER): Payer: Medicare Other | Admitting: Interventional Cardiology

## 2014-03-10 VITALS — BP 130/88 | HR 47 | Ht 76.0 in | Wt 273.0 lb

## 2014-03-10 DIAGNOSIS — I4891 Unspecified atrial fibrillation: Secondary | ICD-10-CM

## 2014-03-10 DIAGNOSIS — I10 Essential (primary) hypertension: Secondary | ICD-10-CM

## 2014-03-10 DIAGNOSIS — E039 Hypothyroidism, unspecified: Secondary | ICD-10-CM

## 2014-03-10 DIAGNOSIS — E785 Hyperlipidemia, unspecified: Secondary | ICD-10-CM | POA: Insufficient documentation

## 2014-03-10 DIAGNOSIS — I480 Paroxysmal atrial fibrillation: Secondary | ICD-10-CM

## 2014-03-10 DIAGNOSIS — Z5181 Encounter for therapeutic drug level monitoring: Secondary | ICD-10-CM

## 2014-03-10 DIAGNOSIS — E119 Type 2 diabetes mellitus without complications: Secondary | ICD-10-CM

## 2014-03-10 DIAGNOSIS — C140 Malignant neoplasm of pharynx, unspecified: Secondary | ICD-10-CM | POA: Insufficient documentation

## 2014-03-10 DIAGNOSIS — G4733 Obstructive sleep apnea (adult) (pediatric): Secondary | ICD-10-CM

## 2014-03-10 HISTORY — DX: Hyperlipidemia, unspecified: E78.5

## 2014-03-10 HISTORY — DX: Type 2 diabetes mellitus without complications: E11.9

## 2014-03-10 HISTORY — DX: Hypothyroidism, unspecified: E03.9

## 2014-03-10 HISTORY — DX: Obstructive sleep apnea (adult) (pediatric): G47.33

## 2014-03-10 HISTORY — DX: Essential (primary) hypertension: I10

## 2014-03-10 HISTORY — DX: Malignant neoplasm of pharynx, unspecified: C14.0

## 2014-03-10 LAB — POCT INR: INR: 2.1

## 2014-03-10 NOTE — Progress Notes (Signed)
Patient ID: [name], male   DOB: Jul 20, 1950, 63 y.o.   MRN: [id number]    [address] [place], [place]  [address] Phone: [phone] Fax:  [phone]  Date:  03/10/2014   ID:  [name], DOB Jul 29, 1950, MRN [id number]  PCP:  Gennette Pac, MD   ASSESSMENT:  1. Paroxysmal atrial fibrillation, still tolerable clinical pattern 2. Chronic anticoagulation therapy 3. Hypertension, controlled 4. Obstructive sleep apnea, the patient advocates compliance with therapy  PLAN:  1. Maintain the current management strategy. He should call if any prolonged episodes of atrial fibrillation and was sent we'll consider starting medical therapy to suppress. 2. Consider sleep apnea is atrial fibrillation episodes become more frequent 3. Clinical followup in one year   SUBJECTIVE: [name] is a 63 y.o. male is doing about the same now as he was a year ago. He has rare episodes of increased fatigue and if he measures his blood pressure he notices that his rhythm is irregular. These episodes can last up to 6 hours. He has a once or twice per month. He denies neurological symptoms. No blood in his sure in the stool. He denies chest discomfort.   Wt Readings from Last 3 Encounters:  03/10/14 273 lb (123.832 kg)     Past Medical History  Diagnosis Date  . 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 Hypertension   . Diabetes mellitus   . Hypotension   . GERD (gastroesophageal reflux disease)   . Sleep apnea     Current Outpatient Prescriptions  Medication Sig Dispense Refill  . aspirin 325 MG tablet Take 325 mg by mouth daily.      Marland Kitchen docusate sodium (COLACE) 100 MG capsule Take 100 mg by mouth 2 (two) times daily as needed. For stool softener.      Marland Kitchen glimepiride (AMARYL) 1 MG tablet Take 1 mg by mouth daily with breakfast.      . hydrochlorothiazide (HYDRODIURIL) 25 MG tablet Take 25 mg by mouth daily.      Marland Kitchen levothyroxine (SYNTHROID, LEVOTHROID)  112 MCG tablet Take 112 mcg by mouth daily.      Marland Kitchen liothyronine (CYTOMEL) 5 MCG tablet Take 5 mcg by mouth daily.      . metFORMIN (GLUCOPHAGE) 1000 MG tablet Take 1,000 mg by mouth 2 (two) times daily with a meal.      . metoprolol succinate (TOPROL-XL) 50 MG 24 hr tablet Take 1 tablet in the am and 1/2 tablet in the pm  45 tablet  6  . Multiple Vitamin (MULITIVITAMIN WITH MINERALS) TABS Take 1 tablet by mouth daily.      Marland Kitchen omeprazole (PRILOSEC) 20 MG capsule Take 20 mg by mouth daily.      . pravastatin (PRAVACHOL) 80 MG tablet Take 80 mg by mouth daily.      . quinapril (ACCUPRIL) 40 MG tablet Take 40 mg by mouth. Take 1 tablet in AM, and 1/2 tablet in evening      . warfarin (COUMADIN) 5 MG tablet Take as directed by Anticoagulation clinic  30 tablet  3   No current facility-administered medications for this visit.    Allergies:    Allergies  Allergen Reactions  . Nsaids     Social History:  The patient  reports that he has never smoked. He does not have any smokeless tobacco history on file. He reports that he does not drink alcohol or use illicit  drugs.   ROS:  Please see the history of present illness.   Obese. Gain weight. No transient neurological symptoms. No episodes of syncope. Denies claudication or lower extremity swelling.   All other systems reviewed and negative.   OBJECTIVE: VS:  BP 130/88  Pulse 47  Ht 6\' 4"  (1.93 m)  Wt 273 lb (123.832 kg)  BMI 33.24 kg/m2 Well nourished, well developed, in no acute distress, obese HEENT: normal Neck: JVD flat. Carotid bruit absent  Cardiac:  normal S1, S2; RRR; no murmur Lungs:  clear to auscultation bilaterally, no wheezing, rhonchi or rales Abd: soft, nontender, no hepatomegaly Ext: Edema absent. Pulses 2+ Skin: warm and dry Neuro:  CNs 2-12 intact, no focal abnormalities noted  EKG:  Sinus bradycardia at 47 beats per minute with leftward axis. LVH.       Signed, Illene Labrador III, MD 03/10/2014 3:09 PM

## 2014-03-10 NOTE — Patient Instructions (Signed)
Your physician recommends that you continue on your current medications as directed. Please refer to the Current Medication list given to you today.  Call the office if you have any prolonged episodes of A-fib  Your physician wants you to follow-up in: 1 year with Dr.Smith You will receive a reminder letter in the mail two months in advance. If you don't receive a letter, please call our office to schedule the follow-up appointment.

## 2014-04-07 ENCOUNTER — Ambulatory Visit (INDEPENDENT_AMBULATORY_CARE_PROVIDER_SITE_OTHER): Payer: Medicare Other | Admitting: *Deleted

## 2014-04-07 DIAGNOSIS — I4891 Unspecified atrial fibrillation: Secondary | ICD-10-CM | POA: Diagnosis not present

## 2014-04-07 DIAGNOSIS — I480 Paroxysmal atrial fibrillation: Secondary | ICD-10-CM

## 2014-04-07 DIAGNOSIS — Z5181 Encounter for therapeutic drug level monitoring: Secondary | ICD-10-CM

## 2014-04-07 LAB — POCT INR: INR: 2.7

## 2014-04-22 DIAGNOSIS — E11329 Type 2 diabetes mellitus with mild nonproliferative diabetic retinopathy without macular edema: Secondary | ICD-10-CM | POA: Diagnosis not present

## 2014-04-22 DIAGNOSIS — E11331 Type 2 diabetes mellitus with moderate nonproliferative diabetic retinopathy with macular edema: Secondary | ICD-10-CM | POA: Diagnosis not present

## 2014-05-05 ENCOUNTER — Ambulatory Visit (INDEPENDENT_AMBULATORY_CARE_PROVIDER_SITE_OTHER): Payer: Medicare Other | Admitting: Pharmacist

## 2014-05-05 DIAGNOSIS — Z5181 Encounter for therapeutic drug level monitoring: Secondary | ICD-10-CM

## 2014-05-05 DIAGNOSIS — I4891 Unspecified atrial fibrillation: Secondary | ICD-10-CM | POA: Diagnosis not present

## 2014-05-05 DIAGNOSIS — I480 Paroxysmal atrial fibrillation: Secondary | ICD-10-CM | POA: Diagnosis not present

## 2014-05-05 LAB — POCT INR: INR: 2.5

## 2014-05-07 DIAGNOSIS — Z23 Encounter for immunization: Secondary | ICD-10-CM | POA: Diagnosis not present

## 2014-05-07 DIAGNOSIS — E118 Type 2 diabetes mellitus with unspecified complications: Secondary | ICD-10-CM | POA: Diagnosis not present

## 2014-06-02 ENCOUNTER — Encounter: Payer: Self-pay | Admitting: Pharmacist

## 2014-06-03 ENCOUNTER — Other Ambulatory Visit: Payer: Self-pay | Admitting: *Deleted

## 2014-06-03 MED ORDER — METOPROLOL SUCCINATE ER 50 MG PO TB24: ORAL_TABLET | ORAL | Status: DC

## 2014-06-16 ENCOUNTER — Ambulatory Visit (INDEPENDENT_AMBULATORY_CARE_PROVIDER_SITE_OTHER): Payer: Medicare Other | Admitting: Pharmacist

## 2014-06-16 DIAGNOSIS — Z5181 Encounter for therapeutic drug level monitoring: Secondary | ICD-10-CM | POA: Diagnosis not present

## 2014-06-16 DIAGNOSIS — I480 Paroxysmal atrial fibrillation: Secondary | ICD-10-CM

## 2014-06-16 DIAGNOSIS — I4891 Unspecified atrial fibrillation: Secondary | ICD-10-CM

## 2014-06-16 LAB — POCT INR: INR: 2.9

## 2014-06-16 MED ORDER — WARFARIN SODIUM 5 MG PO TABS: ORAL_TABLET | ORAL | Status: DC

## 2014-07-28 ENCOUNTER — Ambulatory Visit (INDEPENDENT_AMBULATORY_CARE_PROVIDER_SITE_OTHER): Payer: Medicare Other

## 2014-07-28 DIAGNOSIS — I480 Paroxysmal atrial fibrillation: Secondary | ICD-10-CM

## 2014-07-28 DIAGNOSIS — I4891 Unspecified atrial fibrillation: Secondary | ICD-10-CM

## 2014-07-28 DIAGNOSIS — Z5181 Encounter for therapeutic drug level monitoring: Secondary | ICD-10-CM | POA: Diagnosis not present

## 2014-07-28 LAB — POCT INR: INR: 2.4

## 2014-09-08 ENCOUNTER — Ambulatory Visit (INDEPENDENT_AMBULATORY_CARE_PROVIDER_SITE_OTHER): Payer: Medicare Other | Admitting: *Deleted

## 2014-09-08 DIAGNOSIS — I4891 Unspecified atrial fibrillation: Secondary | ICD-10-CM

## 2014-09-08 DIAGNOSIS — I480 Paroxysmal atrial fibrillation: Secondary | ICD-10-CM | POA: Diagnosis not present

## 2014-09-08 DIAGNOSIS — Z5181 Encounter for therapeutic drug level monitoring: Secondary | ICD-10-CM

## 2014-09-08 LAB — POCT INR: INR: 2.3

## 2014-10-16 DIAGNOSIS — I4891 Unspecified atrial fibrillation: Secondary | ICD-10-CM | POA: Diagnosis not present

## 2014-10-16 DIAGNOSIS — E119 Type 2 diabetes mellitus without complications: Secondary | ICD-10-CM | POA: Diagnosis not present

## 2014-10-20 ENCOUNTER — Ambulatory Visit (INDEPENDENT_AMBULATORY_CARE_PROVIDER_SITE_OTHER): Payer: Medicare Other

## 2014-10-20 DIAGNOSIS — I480 Paroxysmal atrial fibrillation: Secondary | ICD-10-CM

## 2014-10-20 DIAGNOSIS — I4891 Unspecified atrial fibrillation: Secondary | ICD-10-CM | POA: Diagnosis not present

## 2014-10-20 DIAGNOSIS — Z5181 Encounter for therapeutic drug level monitoring: Secondary | ICD-10-CM

## 2014-10-20 LAB — POCT INR: INR: 2.8

## 2014-10-21 DIAGNOSIS — H2513 Age-related nuclear cataract, bilateral: Secondary | ICD-10-CM | POA: Diagnosis not present

## 2014-10-21 DIAGNOSIS — I10 Essential (primary) hypertension: Secondary | ICD-10-CM | POA: Diagnosis not present

## 2014-10-21 DIAGNOSIS — E11331 Type 2 diabetes mellitus with moderate nonproliferative diabetic retinopathy with macular edema: Secondary | ICD-10-CM | POA: Diagnosis not present

## 2014-10-27 ENCOUNTER — Other Ambulatory Visit: Payer: Self-pay | Admitting: Interventional Cardiology

## 2014-11-16 ENCOUNTER — Telehealth: Payer: Self-pay

## 2014-11-16 NOTE — Telephone Encounter (Signed)
Received a call from Ringo at [facility]. She called to ck the status of cardiac clearance for rqst to hold pt Coumadin for upcoming colonoscopy. Adv her that I have not received the rqst. She will refax, adv her that Dr.Smith will not return to the office until tomorrow 5/24. She verbalized understanding.

## 2014-11-20 ENCOUNTER — Telehealth: Payer: Self-pay | Admitting: Interventional Cardiology

## 2014-11-20 NOTE — Telephone Encounter (Signed)
Fax has been received, but not signed. Will forward to his nurse Lattie Haw.

## 2014-11-20 NOTE — Telephone Encounter (Signed)
New message     Fax request sent over on Monday 5/23.    Office checking on the status of form - upcoming colonoscopy .     Office is aware that Dr. [name] is out of the office until 6/2.

## 2014-11-24 NOTE — Telephone Encounter (Signed)
Dr.Smith is currently out of the office. Will route a message to Richardean Chimera For an ok for pt to hold coumadin for his upcoming Colonoscopy on 6/8

## 2014-11-24 NOTE — Telephone Encounter (Signed)
[phone] [name] following up on surg clearance for procedure next Wednesday-but may need to hold coumadin so needs to know asap

## 2014-11-26 ENCOUNTER — Telehealth: Payer: Self-pay | Admitting: Interventional Cardiology

## 2014-11-26 NOTE — Telephone Encounter (Signed)
New Message  [name] from Dr. Oletta Lamas office calling to confirm that clearance was given. Per [name]- Dr. Oletta Lamas office has not received fax. Please fax again/callback for verbal clearance.

## 2014-11-26 NOTE — Telephone Encounter (Signed)
Pt has a CHADS2 score of 2 and no history of TIA or stroke.  Per protocol, okay to hold Coumadin x 5 days prior to procedure.  LMOM for pt to call for further instructions.  Will need to take last dose of Coumadin tonight.  Will fax information to [facility].

## 2014-11-26 NOTE — Telephone Encounter (Signed)
Spoke with pt.  He is aware to hold his coumadin starting tomorrow and appt is made for 6/16 to recheck INR.

## 2014-11-26 NOTE — Telephone Encounter (Signed)
Returned call to [place], gave verbal clearance, see previous phone note.  Gay Filler faxed written clearance to [facility] as well today.

## 2014-12-02 ENCOUNTER — Other Ambulatory Visit: Payer: Self-pay | Admitting: Gastroenterology

## 2014-12-02 DIAGNOSIS — K5730 Diverticulosis of large intestine without perforation or abscess without bleeding: Secondary | ICD-10-CM | POA: Diagnosis not present

## 2014-12-02 DIAGNOSIS — Z1211 Encounter for screening for malignant neoplasm of colon: Secondary | ICD-10-CM | POA: Diagnosis not present

## 2014-12-02 DIAGNOSIS — D122 Benign neoplasm of ascending colon: Secondary | ICD-10-CM | POA: Diagnosis not present

## 2014-12-02 DIAGNOSIS — K640 First degree hemorrhoids: Secondary | ICD-10-CM | POA: Diagnosis not present

## 2014-12-02 DIAGNOSIS — D126 Benign neoplasm of colon, unspecified: Secondary | ICD-10-CM | POA: Diagnosis not present

## 2014-12-30 ENCOUNTER — Ambulatory Visit (INDEPENDENT_AMBULATORY_CARE_PROVIDER_SITE_OTHER): Payer: Medicare Other | Admitting: *Deleted

## 2014-12-30 DIAGNOSIS — Z5181 Encounter for therapeutic drug level monitoring: Secondary | ICD-10-CM | POA: Diagnosis not present

## 2014-12-30 DIAGNOSIS — I480 Paroxysmal atrial fibrillation: Secondary | ICD-10-CM

## 2014-12-30 DIAGNOSIS — I4891 Unspecified atrial fibrillation: Secondary | ICD-10-CM | POA: Diagnosis not present

## 2014-12-30 LAB — POCT INR: INR: 2.5

## 2015-01-05 ENCOUNTER — Other Ambulatory Visit: Payer: Self-pay | Admitting: Interventional Cardiology

## 2015-02-02 DIAGNOSIS — E119 Type 2 diabetes mellitus without complications: Secondary | ICD-10-CM | POA: Diagnosis not present

## 2015-02-03 DIAGNOSIS — I4891 Unspecified atrial fibrillation: Secondary | ICD-10-CM | POA: Diagnosis not present

## 2015-02-03 DIAGNOSIS — G4730 Sleep apnea, unspecified: Secondary | ICD-10-CM | POA: Diagnosis not present

## 2015-02-03 DIAGNOSIS — E039 Hypothyroidism, unspecified: Secondary | ICD-10-CM | POA: Diagnosis not present

## 2015-02-03 DIAGNOSIS — E118 Type 2 diabetes mellitus with unspecified complications: Secondary | ICD-10-CM | POA: Diagnosis not present

## 2015-02-03 DIAGNOSIS — N189 Chronic kidney disease, unspecified: Secondary | ICD-10-CM | POA: Diagnosis not present

## 2015-02-03 DIAGNOSIS — I25119 Atherosclerotic heart disease of native coronary artery with unspecified angina pectoris: Secondary | ICD-10-CM | POA: Diagnosis not present

## 2015-02-03 DIAGNOSIS — I10 Essential (primary) hypertension: Secondary | ICD-10-CM | POA: Diagnosis not present

## 2015-02-03 DIAGNOSIS — E782 Mixed hyperlipidemia: Secondary | ICD-10-CM | POA: Diagnosis not present

## 2015-02-10 ENCOUNTER — Ambulatory Visit (INDEPENDENT_AMBULATORY_CARE_PROVIDER_SITE_OTHER): Payer: Medicare Other | Admitting: Pharmacist

## 2015-02-10 DIAGNOSIS — I480 Paroxysmal atrial fibrillation: Secondary | ICD-10-CM | POA: Diagnosis not present

## 2015-02-10 DIAGNOSIS — Z5181 Encounter for therapeutic drug level monitoring: Secondary | ICD-10-CM | POA: Diagnosis not present

## 2015-02-10 DIAGNOSIS — I4891 Unspecified atrial fibrillation: Secondary | ICD-10-CM

## 2015-02-10 LAB — POCT INR: INR: 2.1

## 2015-03-18 ENCOUNTER — Other Ambulatory Visit: Payer: Self-pay | Admitting: Interventional Cardiology

## 2015-03-21 NOTE — Progress Notes (Signed)
Cardiology Office Note   Date:  03/24/2015   ID:  [name], DOB Jun 04, 1951, MRN [id number]  PCP:  Gennette Pac, MD  Cardiologist:  Sinclair Grooms, MD   Chief Complaint  Patient presents with  . Atrial Fibrillation      History of Present Illness: [name] is a 64 y.o. male who presents for paroxysmal A. fib, hypertension, diabetes, and chronic anticoagulation therapy  Progress fatigue. Increasingly frequent episodes of palpitations that can last up to 10 hours and cause fatigue and weakness. He presumes his episodes to be atrial fibrillation. He denies dyspnea. He denies angina. There is some exertional intolerance even without palpitations being present.    Past Medical History  Diagnosis Date  . 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 Hypertension   . Diabetes mellitus   . Hypotension   . GERD (gastroesophageal reflux disease)   . Sleep apnea     Past Surgical History  Procedure Laterality Date  . Breast surgery    . Cardiac catheterization       Current Outpatient Prescriptions  Medication Sig Dispense Refill  . aspirin 325 MG tablet Take 325 mg by mouth daily.    Marland Kitchen docusate sodium (COLACE) 100 MG capsule Take 100 mg by mouth 2 (two) times daily as needed. For stool softener.    Marland Kitchen glimepiride (AMARYL) 1 MG tablet Take 1 mg by mouth daily with breakfast.    . hydrochlorothiazide (HYDRODIURIL) 25 MG tablet Take 25 mg by mouth daily.    Marland Kitchen levothyroxine (SYNTHROID, LEVOTHROID) 112 MCG tablet Take 112 mcg by mouth daily.    Marland Kitchen liothyronine (CYTOMEL) 5 MCG tablet Take 5 mcg by mouth daily.    . metFORMIN (GLUCOPHAGE) 1000 MG tablet Take 1,000 mg by mouth 2 (two) times daily with a meal.    . metoprolol succinate (TOPROL-XL) 50 MG 24 hr tablet TAKE ONE TABLET BY MOUTH IN THE MORNING AND ONE-HALF TAB IN THE EVENING 45 tablet 3  . Multiple Vitamin (MULITIVITAMIN WITH MINERALS) TABS Take 1 tablet by mouth daily.    . Omega-3 Fatty  Acids (FISH OIL PO) Take 2 capsules by mouth daily.    . pravastatin (PRAVACHOL) 80 MG tablet Take 80 mg by mouth daily.    . quinapril (ACCUPRIL) 40 MG tablet Take 40 mg by mouth. Take 1 tablet in AM, and 1/2 tablet in evening    . ranitidine (ZANTAC) 150 MG tablet Take 150 mg by mouth daily.    Marland Kitchen warfarin (COUMADIN) 5 MG tablet TAKE AS DIRECTED BY  ANTICOAGULATION  CLINIC 30 tablet 2  . amLODipine (NORVASC) 2.5 MG tablet Take 2.5 mg by mouth daily.     No current facility-administered medications for this visit.    Allergies:   Nsaids    Social History:  The patient  reports that he has never smoked. He has never used smokeless tobacco. He reports that he does not drink alcohol or use illicit drugs.   Family History:  The patient's family history includes Coronary artery disease in his father.    ROS:  Please see the history of present illness.   Otherwise, review of systems are positive for fatigue, depression, back pain, recently lost his mother..   All other systems are reviewed and negative.    PHYSICAL EXAM: VS:  BP 142/84 mmHg  Pulse 48  Ht 6\' 4"  (1.93 m)  Wt 121.02 kg (266 lb 12.8 oz)  BMI  32.49 kg/m2 , BMI Body mass index is 32.49 kg/(m^2). GEN: Well nourished, well developed, in no acute distress HEENT: normal Neck: no JVD, carotid bruits, or masses Cardiac: IRR. Is a 2/6 systolic  right upper sternal border murmur, rub, or gallop. There is  no edema Respiratory:  clear to auscultation bilaterally, normal work of breathing. GI: soft, nontender, nondistended, + BS MS: no deformity or atrophy Skin: warm and dry, no rash Neuro:  Strength and sensation are intact Psych: euthymic mood, full affect   EKG:  EKG is ordered today. The ekg reveals marked sinus bradycardia at 48 bpm otherwise unremarkable   Recent Labs: No results found for requested labs within last 365 days.    Lipid Panel No results found for: CHOL, TRIG, HDL, CHOLHDL, VLDL, LDLCALC, LDLDIRECT     Wt Readings from Last 3 Encounters:  03/24/15 121.02 kg (266 lb 12.8 oz)  03/10/14 123.832 kg (273 lb)      Other studies Reviewed: Additional studies/ records that were reviewed today incluchart review reveals no recent admissions.e findings include  no new data.    ASSESSMENT AND PLAN:  1. Paroxysmal atrial fibrillation  More frequent episodes. Resting sinus bradycardia, ? SSS/Tachy Loletha Grayer. Significant sinus bradycardia this morning.   2. Essential hypertension Controlled  3. Encounter for therapeutic drug monitoring No bleeding on Coumadin  4. Hyperlipidemia Stable on current therapy     Current medicines are reviewed at length with the patient today.  The patient has the following concerns regarding medications: None .  The following changes/actions have been instituted:    24-hour Holter  May need to switch over to rhythm control strategy. Because of bradycardia on resting EKG, I am concerned about inducing the need for permanent pacemaker therapy. May need EP referral after I see the 24-hour monitor.  Labs/ tests ordered today include:   Orders Placed This Encounter  Procedures  . Holter monitor - 24 hour  . EKG 12-Lead     Disposition:   FU with HS in 1 year  Signed, Sinclair Grooms, MD  03/24/2015 10:59 AM    [facility] [place], [place], [place]  [address] Phone: [phone]; Fax: [phone]

## 2015-03-24 ENCOUNTER — Ambulatory Visit (INDEPENDENT_AMBULATORY_CARE_PROVIDER_SITE_OTHER): Payer: Medicare Other | Admitting: Interventional Cardiology

## 2015-03-24 ENCOUNTER — Encounter: Payer: Self-pay | Admitting: Interventional Cardiology

## 2015-03-24 ENCOUNTER — Ambulatory Visit (INDEPENDENT_AMBULATORY_CARE_PROVIDER_SITE_OTHER): Payer: Medicare Other | Admitting: *Deleted

## 2015-03-24 VITALS — BP 142/84 | HR 48 | Ht 76.0 in | Wt 266.8 lb

## 2015-03-24 DIAGNOSIS — I10 Essential (primary) hypertension: Secondary | ICD-10-CM | POA: Diagnosis not present

## 2015-03-24 DIAGNOSIS — I4891 Unspecified atrial fibrillation: Secondary | ICD-10-CM | POA: Diagnosis not present

## 2015-03-24 DIAGNOSIS — E785 Hyperlipidemia, unspecified: Secondary | ICD-10-CM

## 2015-03-24 DIAGNOSIS — Z5181 Encounter for therapeutic drug level monitoring: Secondary | ICD-10-CM

## 2015-03-24 DIAGNOSIS — I480 Paroxysmal atrial fibrillation: Secondary | ICD-10-CM | POA: Diagnosis not present

## 2015-03-24 LAB — POCT INR: INR: 1.4

## 2015-03-24 NOTE — Patient Instructions (Signed)
Medication Instructions:  Your physician recommends that you continue on your current medications as directed. Please refer to the Current Medication list given to you today.   Labwork: None ordered   Testing/Procedures: Your physician has recommended that you wear a holter monitor. Holter monitors are medical devices that record the heart's electrical activity. Doctors most often use these monitors to diagnose arrhythmias. Arrhythmias are problems with the speed or rhythm of the heartbeat. The monitor is a small, portable device. You can wear one while you do your normal daily activities. This is usually used to diagnose what is causing palpitations/syncope (passing out).   Follow-Up: Your physician wants you to follow-up in: 1 year with Dr.Smith You will receive a reminder letter in the mail two months in advance. If you don't receive a letter, please call our office to schedule the follow-up appointment.   Any Other Special Instructions Will Be Listed Below (If Applicable).

## 2015-03-31 ENCOUNTER — Ambulatory Visit (INDEPENDENT_AMBULATORY_CARE_PROVIDER_SITE_OTHER): Payer: Medicare Other | Admitting: *Deleted

## 2015-03-31 ENCOUNTER — Ambulatory Visit (INDEPENDENT_AMBULATORY_CARE_PROVIDER_SITE_OTHER): Payer: Medicare Other

## 2015-03-31 DIAGNOSIS — I480 Paroxysmal atrial fibrillation: Secondary | ICD-10-CM

## 2015-03-31 DIAGNOSIS — I4891 Unspecified atrial fibrillation: Secondary | ICD-10-CM | POA: Diagnosis not present

## 2015-03-31 DIAGNOSIS — Z5181 Encounter for therapeutic drug level monitoring: Secondary | ICD-10-CM

## 2015-03-31 LAB — POCT INR: INR: 2.6

## 2015-04-21 ENCOUNTER — Ambulatory Visit (INDEPENDENT_AMBULATORY_CARE_PROVIDER_SITE_OTHER): Payer: Medicare Other | Admitting: *Deleted

## 2015-04-21 DIAGNOSIS — I4891 Unspecified atrial fibrillation: Secondary | ICD-10-CM

## 2015-04-21 DIAGNOSIS — E113393 Type 2 diabetes mellitus with moderate nonproliferative diabetic retinopathy without macular edema, bilateral: Secondary | ICD-10-CM | POA: Diagnosis not present

## 2015-04-21 DIAGNOSIS — I480 Paroxysmal atrial fibrillation: Secondary | ICD-10-CM | POA: Diagnosis not present

## 2015-04-21 DIAGNOSIS — Z5181 Encounter for therapeutic drug level monitoring: Secondary | ICD-10-CM | POA: Diagnosis not present

## 2015-04-21 LAB — POCT INR: INR: 2

## 2015-04-27 ENCOUNTER — Encounter: Payer: Self-pay | Admitting: Pharmacist

## 2015-05-02 ENCOUNTER — Encounter: Payer: Self-pay | Admitting: Interventional Cardiology

## 2015-05-07 DIAGNOSIS — E118 Type 2 diabetes mellitus with unspecified complications: Secondary | ICD-10-CM | POA: Diagnosis not present

## 2015-05-07 DIAGNOSIS — Z0000 Encounter for general adult medical examination without abnormal findings: Secondary | ICD-10-CM | POA: Diagnosis not present

## 2015-05-07 DIAGNOSIS — Z125 Encounter for screening for malignant neoplasm of prostate: Secondary | ICD-10-CM | POA: Diagnosis not present

## 2015-05-07 DIAGNOSIS — Z23 Encounter for immunization: Secondary | ICD-10-CM | POA: Diagnosis not present

## 2015-05-07 DIAGNOSIS — E039 Hypothyroidism, unspecified: Secondary | ICD-10-CM | POA: Diagnosis not present

## 2015-05-07 DIAGNOSIS — I4891 Unspecified atrial fibrillation: Secondary | ICD-10-CM | POA: Diagnosis not present

## 2015-05-07 DIAGNOSIS — E782 Mixed hyperlipidemia: Secondary | ICD-10-CM | POA: Diagnosis not present

## 2015-05-07 DIAGNOSIS — R8290 Unspecified abnormal findings in urine: Secondary | ICD-10-CM | POA: Diagnosis not present

## 2015-05-07 DIAGNOSIS — I10 Essential (primary) hypertension: Secondary | ICD-10-CM | POA: Diagnosis not present

## 2015-05-07 DIAGNOSIS — Z85819 Personal history of malignant neoplasm of unspecified site of lip, oral cavity, and pharynx: Secondary | ICD-10-CM | POA: Diagnosis not present

## 2015-05-07 DIAGNOSIS — N189 Chronic kidney disease, unspecified: Secondary | ICD-10-CM | POA: Diagnosis not present

## 2015-05-07 DIAGNOSIS — G4730 Sleep apnea, unspecified: Secondary | ICD-10-CM | POA: Diagnosis not present

## 2015-05-12 ENCOUNTER — Ambulatory Visit (INDEPENDENT_AMBULATORY_CARE_PROVIDER_SITE_OTHER): Payer: Medicare Other | Admitting: *Deleted

## 2015-05-12 DIAGNOSIS — I480 Paroxysmal atrial fibrillation: Secondary | ICD-10-CM

## 2015-05-12 DIAGNOSIS — Z5181 Encounter for therapeutic drug level monitoring: Secondary | ICD-10-CM | POA: Diagnosis not present

## 2015-05-12 DIAGNOSIS — I4891 Unspecified atrial fibrillation: Secondary | ICD-10-CM | POA: Diagnosis not present

## 2015-05-12 LAB — POCT INR: INR: 2.2

## 2015-05-12 NOTE — Patient Instructions (Signed)
Spoke with patient and advised that we received the clearance form from Dr. Oletta Lamas office regarding his Colonoscopy on 06/03/15 & informed the patient that he is cleared to hold Coumadin 5 days.  Instructed patient to take last dose of Coumadin on 05/28/15.  Patient will resume Coumadin when instructed to do so by the Physician and once he resumes advised to [name] an extra 1/2 tablet for two doses-total of 1.5 tablets. Patient verbalized understanding and advised to call with any questions.

## 2015-05-21 DIAGNOSIS — M79644 Pain in right finger(s): Secondary | ICD-10-CM | POA: Diagnosis not present

## 2015-05-21 DIAGNOSIS — M79645 Pain in left finger(s): Secondary | ICD-10-CM | POA: Diagnosis not present

## 2015-05-21 DIAGNOSIS — M65321 Trigger finger, right index finger: Secondary | ICD-10-CM | POA: Diagnosis not present

## 2015-05-21 DIAGNOSIS — M65312 Trigger thumb, left thumb: Secondary | ICD-10-CM | POA: Diagnosis not present

## 2015-05-22 ENCOUNTER — Other Ambulatory Visit: Payer: Self-pay | Admitting: Interventional Cardiology

## 2015-05-26 ENCOUNTER — Encounter (HOSPITAL_COMMUNITY): Payer: Self-pay | Admitting: *Deleted

## 2015-06-02 ENCOUNTER — Other Ambulatory Visit: Payer: Self-pay | Admitting: Gastroenterology

## 2015-06-03 ENCOUNTER — Encounter (HOSPITAL_COMMUNITY)
Admission: RE | Disposition: A | Discharge status: Home / Self Care | Payer: Self-pay | Source: Ambulatory Visit | Attending: Gastroenterology

## 2015-06-03 ENCOUNTER — Ambulatory Visit (HOSPITAL_COMMUNITY): Payer: Medicare Other | Admitting: Anesthesiology

## 2015-06-03 ENCOUNTER — Encounter (HOSPITAL_COMMUNITY): Payer: Self-pay

## 2015-06-03 ENCOUNTER — Ambulatory Visit (HOSPITAL_COMMUNITY)
Admission: RE | Admit: 2015-06-03 | Discharge: 2015-06-03 | Disposition: A | Discharge status: Home / Self Care | Payer: Medicare Other | Source: Ambulatory Visit | Attending: Gastroenterology | Admitting: Gastroenterology

## 2015-06-03 DIAGNOSIS — D125 Benign neoplasm of sigmoid colon: Secondary | ICD-10-CM | POA: Diagnosis not present

## 2015-06-03 DIAGNOSIS — Z7901 Long term (current) use of anticoagulants: Secondary | ICD-10-CM | POA: Insufficient documentation

## 2015-06-03 DIAGNOSIS — Z7984 Long term (current) use of oral hypoglycemic drugs: Secondary | ICD-10-CM | POA: Insufficient documentation

## 2015-06-03 DIAGNOSIS — Z85819 Personal history of malignant neoplasm of unspecified site of lip, oral cavity, and pharynx: Secondary | ICD-10-CM | POA: Insufficient documentation

## 2015-06-03 DIAGNOSIS — E119 Type 2 diabetes mellitus without complications: Secondary | ICD-10-CM | POA: Diagnosis not present

## 2015-06-03 DIAGNOSIS — I10 Essential (primary) hypertension: Secondary | ICD-10-CM | POA: Insufficient documentation

## 2015-06-03 DIAGNOSIS — K219 Gastro-esophageal reflux disease without esophagitis: Secondary | ICD-10-CM | POA: Diagnosis not present

## 2015-06-03 DIAGNOSIS — Z7982 Long term (current) use of aspirin: Secondary | ICD-10-CM | POA: Diagnosis not present

## 2015-06-03 DIAGNOSIS — Z79899 Other long term (current) drug therapy: Secondary | ICD-10-CM | POA: Insufficient documentation

## 2015-06-03 DIAGNOSIS — G4730 Sleep apnea, unspecified: Secondary | ICD-10-CM | POA: Insufficient documentation

## 2015-06-03 DIAGNOSIS — Z09 Encounter for follow-up examination after completed treatment for conditions other than malignant neoplasm: Secondary | ICD-10-CM | POA: Diagnosis present

## 2015-06-03 DIAGNOSIS — E785 Hyperlipidemia, unspecified: Secondary | ICD-10-CM | POA: Diagnosis not present

## 2015-06-03 DIAGNOSIS — I480 Paroxysmal atrial fibrillation: Secondary | ICD-10-CM | POA: Diagnosis not present

## 2015-06-03 DIAGNOSIS — Z9221 Personal history of antineoplastic chemotherapy: Secondary | ICD-10-CM | POA: Diagnosis not present

## 2015-06-03 DIAGNOSIS — G4733 Obstructive sleep apnea (adult) (pediatric): Secondary | ICD-10-CM | POA: Diagnosis not present

## 2015-06-03 DIAGNOSIS — E039 Hypothyroidism, unspecified: Secondary | ICD-10-CM | POA: Insufficient documentation

## 2015-06-03 HISTORY — PX: COLONOSCOPY WITH PROPOFOL: SHX5780

## 2015-06-03 HISTORY — PX: HOT HEMOSTASIS: SHX5433

## 2015-06-03 LAB — GLUCOSE, CAPILLARY: GLUCOSE-CAPILLARY: 121 mg/dL — AB (ref 65–99)

## 2015-06-03 SURGERY — COLONOSCOPY WITH PROPOFOL
Anesthesia: Monitor Anesthesia Care

## 2015-06-03 MED ORDER — PROPOFOL 10 MG/ML IV BOLUS
INTRAVENOUS | Status: AC
  Filled 2015-06-03: qty 20

## 2015-06-03 MED ORDER — LACTATED RINGERS IV SOLN
INTRAVENOUS | Status: DC
  Administered 2015-06-03: 08:00:00 via INTRAVENOUS

## 2015-06-03 MED ORDER — PROPOFOL 10 MG/ML IV BOLUS
INTRAVENOUS | Status: DC | PRN
  Administered 2015-06-03: 50 mg via INTRAVENOUS
  Administered 2015-06-03: 100 mg via INTRAVENOUS
  Administered 2015-06-03 (×2): 50 mg via INTRAVENOUS

## 2015-06-03 MED ORDER — FENTANYL CITRATE (PF) 100 MCG/2ML IJ SOLN: 25.0000 ug | INTRAMUSCULAR | Status: DC | PRN

## 2015-06-03 SURGICAL SUPPLY — 21 items

## 2015-06-03 NOTE — Op Note (Signed)
[facility] [place], [address]   COLONOSCOPY PROCEDURE REPORT  PATIENT: [name], [name]  MR#: [id number] BIRTHDATE: [birth year] , 66  yrs. old GENDER: male ENDOSCOPIST: Laurence Spates, MD REFERRED BY:  Dr. [name] PROCEDURE DATE:  2015/07/03 PROCEDURE:   colonoscopy with polypectomy ASA CLASS:   class 3 INDICATIONS:patient had large villous adenoma removed about 6 months ago was not clear that this was complete removal MEDICATIONS: propofol 250 illigrams IV  DESCRIPTION OF PROCEDURE:   After the risks and benefits and of the procedure were explained, informed consent was obtained. normal         The [id number]  endoscope was introduced through the anus and advanced to the cecum identified by appendiceal artifice and ileocecal valve      .  The quality of the prep was good      .  The instrument was then slowly withdrawn as the colon was fully examined. Estimated blood loss is zero unless otherwise noted in this procedure report. The scope was withdrawn. In the hepatic flexure area the tattooed area was seen and there was no residual polypvisualized. The remainder the cloln was seen well. A 1/2 cm sessile polyp was seen in the sigmoid colon and removed with the hot snare. The retro flex fuel was normal.   The scope was then withdrawn from the patient and the procedure completed.  WITHDRAWAL TIME:  COMPLICATIONS: There were no immediate complications. ENDOSCOPIC IMPRESSION: 1. no residual polyp seen in the hepatic flexure area 2. Sigmoid colon polyp removed RECOMMENDATIONS: will resume anticoagulationin 3 days repeat colonoscopy in 3 years  REPEAT EXAM:  cc:Dr. [name]  _______________________________ eSignedLaurence Spates, MD July 03, 2015 10:04 AM   CPT CODES: ICD CODES:  The ICD and CPT codes recommended by this software are interpretations from the data that the clinical staff has captured with the software.   The verification of the translation of this report to the ICD and CPT codes and modifiers is the sole responsibility of the health care institution and practicing physician where this report was generated.  [place]. will not be held responsible for the validity of the ICD and CPT codes included on this report.  AMA assumes no liability for data contained or not contained herein. CPT is a Designer, television/film set of the [employer].   PATIENT NAME:  [name], [name] MR#: [id number]

## 2015-06-03 NOTE — Transfer of Care (Signed)
Immediate Anesthesia Transfer of Care Note  Patient: [name]  Procedure(s) Performed: Procedure(s): COLONOSCOPY WITH PROPOFOL (N/A) HOT HEMOSTASIS (ARGON PLASMA COAGULATION/BICAP) (N/A)  Patient Location: PACU  Anesthesia Type:MAC  Level of Consciousness:  sedated, patient cooperative and responds to stimulation  Airway & Oxygen Therapy:Patient Spontanous Breathing   Post-op Assessment:  Report given to PACU RN and Post -op Vital signs reviewed and stable  Post vital signs:  Reviewed and stable  Last Vitals:  Filed Vitals:   06/03/15 0742  BP: 154/62  Pulse: 113  Temp: 37.3 C  Resp: 18    Complications: No apparent anesthesia complications

## 2015-06-03 NOTE — Anesthesia Preprocedure Evaluation (Addendum)
Anesthesia Evaluation  Patient identified by MRN, date of birth, ID band Patient awake    Reviewed: Allergy & Precautions, H&P , NPO status , Patient's Chart, lab work & pertinent test results  Airway Mallampati: II  TM Distance: >3 FB Neck ROM: full    Dental  (+) Dental Advisory Given, Edentulous Upper, Edentulous Lower   Pulmonary sleep apnea ,  Tonsillar cancer   Pulmonary exam normal breath sounds clear to auscultation       Cardiovascular hypertension, Normal cardiovascular exam+ dysrhythmias Atrial Fibrillation  Rhythm:regular Rate:Normal     Neuro/Psych negative neurological ROS  negative psych ROS   GI/Hepatic negative GI ROS, Neg liver ROS, GERD  Medicated and Controlled,  Endo/Other  diabetes, Well Controlled, Type 2, Oral Hypoglycemic AgentsHypothyroidism   Renal/GU negative Renal ROS  negative genitourinary   Musculoskeletal   Abdominal   Peds  Hematology negative hematology ROS (+)   Anesthesia Other Findings   Reproductive/Obstetrics negative OB ROS                            Anesthesia Physical Anesthesia Plan  ASA: III  Anesthesia Plan: MAC   Post-op Pain Management:    Induction:   Airway Management Planned:   Additional Equipment:   Intra-op Plan:   Post-operative Plan:   Informed Consent: I have reviewed the patients History and Physical, chart, labs and discussed the procedure including the risks, benefits and alternatives for the proposed anesthesia with the patient or authorized representative who has indicated his/her understanding and acceptance.   Dental Advisory Given  Plan Discussed with: CRNA and Surgeon  Anesthesia Plan Comments:         Anesthesia Quick Evaluation

## 2015-06-03 NOTE — Addendum Note (Signed)
Addended by: Vernie Ammons. on: 06/03/2015 02:02 PM   Modules accepted: Orders

## 2015-06-03 NOTE — Discharge Instructions (Signed)
Resume diet and previous medicines.  If biopsies were taken the office will notify you by letter or phone in about 10 days.  RESUME COUMADIN AND ASPIRIN IN 3 DAYS AT THE REGULAR DOSE

## 2015-06-03 NOTE — H&P (Signed)
Subjective:   Patient is a 64 y.o. male presents with recent villous adenoma of the hepatic flexure distal a sending:. This was a quite large polyp was not entirely clear was removed completely. For this reason the procedures to be repeated with the APC on standby his Coumadin has been on hold.. Procedure including risks and benefits discussed in office.  Patient Active Problem List   Diagnosis Date Noted  . Essential hypertension 03/10/2014  . Uncomplicated type 2 diabetes mellitus ([place]) 03/10/2014  . Obstructive sleep apnea 03/10/2014  . 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 Hyperlipidemia 03/10/2014  . Hypothyroidism 03/10/2014  . Encounter for therapeutic drug monitoring 07/22/2013  . Paroxysmal atrial fibrillation ([place]) 04/02/2013   Past Medical History  Diagnosis Date  . Hypertension   . Diabetes mellitus   . Hypotension   . GERD (gastroesophageal reflux disease)   . Sleep apnea   . 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 Breast surgery    . Cardiac catheterization      Prescriptions prior to admission  Medication Sig Dispense Refill Last Dose  . aspirin 325 MG tablet Take 325 mg by mouth daily.   Past Week at Unknown time  . docusate sodium (COLACE) 100 MG capsule Take 100 mg by mouth 2 (two) times daily as needed. For stool softener.   Past Week at Unknown time  . glimepiride (AMARYL) 1 MG tablet Take 1 mg by mouth daily with breakfast.   Past Week at Unknown time  . hydrochlorothiazide (HYDRODIURIL) 25 MG tablet Take 25 mg by mouth daily.   Past Week at Unknown time  . levothyroxine (SYNTHROID, LEVOTHROID) 112 MCG tablet Take 112 mcg by mouth daily.   06/03/2015 at 0600  . liothyronine (CYTOMEL) 5 MCG tablet Take 5 mcg by mouth daily.   06/03/2015 at 0600  . metFORMIN (GLUCOPHAGE) 1000 MG tablet Take 1,000 mg by mouth 2 (two) times daily with a meal.   Past Week at Unknown time  . metoprolol succinate  (TOPROL-XL) 50 MG 24 hr tablet TAKE ONE TABLET BY MOUTH IN THE MORNING AND TAKE ONE-HALF TABLET BY MOUTH IN THE EVENING 45 tablet 10 Past Week at Unknown time  . Multiple Vitamin (MULITIVITAMIN WITH MINERALS) TABS Take 1 tablet by mouth daily.   Past Week at Unknown time  . Omega-3 Fatty Acids (FISH OIL PO) Take 2 capsules by mouth daily.   Past Week at Unknown time  . pravastatin (PRAVACHOL) 80 MG tablet Take 80 mg by mouth at bedtime.    Past Week at Unknown time  . quinapril (ACCUPRIL) 40 MG tablet Take 20-40 mg by mouth 2 (two) times daily. Take 1 tablet in AM, and 1/2 tablet in evening   Past Week at Unknown time  . ranitidine (ZANTAC) 150 MG tablet Take 150 mg by mouth daily.   Past Week at Unknown time  . warfarin (COUMADIN) 5 MG tablet TAKE AS DIRECTED BY  ANTICOAGULATION  CLINIC (Patient taking differently: TAKES 5MG  DAILY OR AS DIRECTED BY  ANTICOAGULATION  CLINIC) 30 tablet 2 Past Week at Unknown time   Allergies  Allergen Reactions  . Nsaids Other (See Comments)    REACTION: PER MD REQUEST NOT TO TAKE    Social History  Substance Use Topics  . Smoking status: Never Smoker   . Smokeless tobacco: Never Used  . Alcohol Use: No    Family History  Problem  Relation Age of Onset  . Coronary artery disease Father      Objective:   Patient Vitals for the past 8 hrs:  BP Temp Temp src Pulse Resp SpO2 Height Weight  06/03/15 0742 (!) 154/62 mmHg 99.1 F (37.3 C) Oral (!) 113 18 100 % 6\' 4"  (1.93 m) 120.657 kg (266 lb)         See MD Preop evaluation      Assessment:   1. Large villous adenoma of the right colon/hepatic flexure question complete removal  Plan:   Will repeat colonoscopy with APC on standby

## 2015-06-03 NOTE — Anesthesia Postprocedure Evaluation (Signed)
Anesthesia Post Note  Patient: [name]  Procedure(s) Performed: Procedure(s) (LRB): COLONOSCOPY WITH PROPOFOL (N/A) HOT HEMOSTASIS (ARGON PLASMA COAGULATION/BICAP) (N/A)  Patient location during evaluation: PACU Anesthesia Type: MAC Level of consciousness: awake and alert Pain management: pain level controlled Vital Signs Assessment: post-procedure vital signs reviewed and stable Respiratory status: spontaneous breathing, nonlabored ventilation, respiratory function stable and patient connected to nasal cannula oxygen Cardiovascular status: blood pressure returned to baseline and stable Postop Assessment: no signs of nausea or vomiting Anesthetic complications: no    Last Vitals:  Filed Vitals:   06/03/15 1020 06/03/15 1025  BP:    Pulse: 84 109  Temp:    Resp: 18 16    Last Pain: There were no vitals filed for this visit.               Carrisa Keller L

## 2015-06-04 ENCOUNTER — Encounter (HOSPITAL_COMMUNITY): Payer: Self-pay | Admitting: Gastroenterology

## 2015-06-10 ENCOUNTER — Ambulatory Visit (INDEPENDENT_AMBULATORY_CARE_PROVIDER_SITE_OTHER): Payer: Medicare Other | Admitting: Surgery

## 2015-06-10 DIAGNOSIS — Z5181 Encounter for therapeutic drug level monitoring: Secondary | ICD-10-CM | POA: Diagnosis not present

## 2015-06-10 DIAGNOSIS — I4891 Unspecified atrial fibrillation: Secondary | ICD-10-CM | POA: Diagnosis not present

## 2015-06-10 DIAGNOSIS — I480 Paroxysmal atrial fibrillation: Secondary | ICD-10-CM

## 2015-06-10 LAB — POCT INR: INR: 1.3

## 2015-06-22 ENCOUNTER — Ambulatory Visit (INDEPENDENT_AMBULATORY_CARE_PROVIDER_SITE_OTHER): Payer: Medicare Other | Admitting: Pharmacist

## 2015-06-22 ENCOUNTER — Other Ambulatory Visit: Payer: Self-pay | Admitting: Interventional Cardiology

## 2015-06-22 VITALS — BP 118/64 | HR 61

## 2015-06-22 DIAGNOSIS — I480 Paroxysmal atrial fibrillation: Secondary | ICD-10-CM

## 2015-06-22 DIAGNOSIS — Z5181 Encounter for therapeutic drug level monitoring: Secondary | ICD-10-CM | POA: Diagnosis not present

## 2015-06-22 DIAGNOSIS — I4891 Unspecified atrial fibrillation: Secondary | ICD-10-CM | POA: Diagnosis not present

## 2015-06-22 LAB — POCT INR: INR: 3.1

## 2015-06-27 ENCOUNTER — Encounter: Payer: Self-pay | Admitting: Interventional Cardiology

## 2015-07-05 ENCOUNTER — Telehealth: Payer: Self-pay | Admitting: Interventional Cardiology

## 2015-07-05 DIAGNOSIS — I4589 Other specified conduction disorders: Secondary | ICD-10-CM

## 2015-07-05 DIAGNOSIS — I480 Paroxysmal atrial fibrillation: Secondary | ICD-10-CM

## 2015-07-05 NOTE — Telephone Encounter (Signed)
Called to adv pt, per Dr.Smith pt needs a gxt to r/o chronotropic incompetence. Order in [place] should call to schedule.

## 2015-07-05 NOTE — Telephone Encounter (Signed)
New Message  Pt rweceived email from Dr [name]- concerning Stress test- pt requested to speak w/ RN concerning if Dr [name] wants a regular stress test or Nuclear stress. Please call back and discuss.

## 2015-07-14 ENCOUNTER — Encounter: Payer: Medicare Other | Admitting: Cardiology

## 2015-07-14 ENCOUNTER — Ambulatory Visit (INDEPENDENT_AMBULATORY_CARE_PROVIDER_SITE_OTHER): Payer: Medicare Other | Admitting: *Deleted

## 2015-07-14 ENCOUNTER — Ambulatory Visit (INDEPENDENT_AMBULATORY_CARE_PROVIDER_SITE_OTHER): Payer: Medicare Other

## 2015-07-14 DIAGNOSIS — Z5181 Encounter for therapeutic drug level monitoring: Secondary | ICD-10-CM

## 2015-07-14 DIAGNOSIS — I4589 Other specified conduction disorders: Secondary | ICD-10-CM

## 2015-07-14 DIAGNOSIS — I4891 Unspecified atrial fibrillation: Secondary | ICD-10-CM

## 2015-07-14 DIAGNOSIS — I480 Paroxysmal atrial fibrillation: Secondary | ICD-10-CM | POA: Diagnosis not present

## 2015-07-14 LAB — POCT INR: INR: 2.6

## 2015-07-19 ENCOUNTER — Telehealth: Payer: Self-pay

## 2015-07-19 DIAGNOSIS — G4733 Obstructive sleep apnea (adult) (pediatric): Secondary | ICD-10-CM | POA: Diagnosis not present

## 2015-07-19 DIAGNOSIS — I480 Paroxysmal atrial fibrillation: Secondary | ICD-10-CM

## 2015-07-19 NOTE — Telephone Encounter (Signed)
-----   Message from Belva Crome, MD sent at 07/16/2015  9:56 AM EST ----- Regarding: Fatigue Let patient know that much of his fatigue is due to recurrent atrial fibrillation. He needs a 48 hour holter, and referral to AF clinic to consider ablation vs rhythm control with meds +/- pacer.  HS  ----- Message -----    From: Erlene Quan, PA-C    Sent: 07/14/2015   4:01 PM      To: Belva Crome, MD  Dr [name] I did an exercise stress on [name] today. I know he has PAF with baseline bradycardia and understand you were looking for chronotropic incompetence. When he presented he was in AF with VR around 90. He was only able to exercise 2:37 and had to stop secondary to fatigue. Max HR 129 in AF. By his history I suspect he is in AF now more than not.    Frank Ransom PA-C 07/14/2015 4:05 PM

## 2015-07-19 NOTE — Telephone Encounter (Signed)
Called to give pt Dr.Smith's recommendation below. [email or web address] 

## 2015-07-19 NOTE — Telephone Encounter (Signed)
Pt aware of Dr.Smith's recommendation below. Pt needs 48 holter and ref to the Afib clinic to consider ablation vs rhythm control with meds. Pt agreeable with plan. Adv pt that a scheduler from our office will call him to schedule. Message routed to pcc to call pt. Pt voiced appreciation and verbalized understanding.

## 2015-07-19 NOTE — Telephone Encounter (Signed)
-----   Message from Belva Crome, MD sent at 07/16/2015  9:56 AM EST ----- Regarding: Fatigue Let patient know that much of his fatigue is due to recurrent atrial fibrillation. He needs a 48 hour holter, and referral to AF clinic to consider ablation vs rhythm control with meds +/- pacer.  HS  ----- Message -----    From: Erlene Quan, PA-C    Sent: 07/14/2015   4:01 PM      To: Belva Crome, MD  Dr [name] I did an exercise stress on Mr Jeanmarie today. I know he has PAF with baseline bradycardia and understand you were looking for chronotropic incompetence. When he presented he was in AF with VR around 90. He was only able to exercise 2:37 and had to stop secondary to fatigue. Max HR 129 in AF. By his history I suspect he is in AF now more than not.    Kerin Ransom PA-C 07/14/2015 4:05 PM

## 2015-07-20 ENCOUNTER — Ambulatory Visit (INDEPENDENT_AMBULATORY_CARE_PROVIDER_SITE_OTHER): Payer: Medicare Other

## 2015-07-20 DIAGNOSIS — I480 Paroxysmal atrial fibrillation: Secondary | ICD-10-CM | POA: Diagnosis not present

## 2015-07-27 ENCOUNTER — Ambulatory Visit (HOSPITAL_COMMUNITY)
Admission: RE | Admit: 2015-07-27 | Discharge: 2015-07-27 | Disposition: A | Discharge status: Home / Self Care | Payer: Medicare Other | Source: Ambulatory Visit | Attending: Nurse Practitioner | Admitting: Nurse Practitioner

## 2015-07-27 VITALS — BP 134/76 | HR 84 | Ht 76.0 in | Wt 268.8 lb

## 2015-07-27 DIAGNOSIS — R600 Localized edema: Secondary | ICD-10-CM | POA: Insufficient documentation

## 2015-07-27 DIAGNOSIS — I4819 Other persistent atrial fibrillation: Secondary | ICD-10-CM

## 2015-07-27 DIAGNOSIS — I481 Persistent atrial fibrillation: Secondary | ICD-10-CM | POA: Diagnosis not present

## 2015-07-27 DIAGNOSIS — I4891 Unspecified atrial fibrillation: Secondary | ICD-10-CM | POA: Diagnosis not present

## 2015-07-28 ENCOUNTER — Encounter (HOSPITAL_COMMUNITY): Payer: Self-pay | Admitting: Nurse Practitioner

## 2015-07-28 ENCOUNTER — Ambulatory Visit (HOSPITAL_COMMUNITY)
Admission: RE | Admit: 2015-07-28 | Discharge: 2015-07-28 | Disposition: A | Discharge status: Home / Self Care | Payer: Medicare Other | Source: Ambulatory Visit | Attending: Nurse Practitioner | Admitting: Nurse Practitioner

## 2015-07-28 DIAGNOSIS — I10 Essential (primary) hypertension: Secondary | ICD-10-CM | POA: Insufficient documentation

## 2015-07-28 DIAGNOSIS — I517 Cardiomegaly: Secondary | ICD-10-CM | POA: Insufficient documentation

## 2015-07-28 DIAGNOSIS — I4819 Other persistent atrial fibrillation: Secondary | ICD-10-CM

## 2015-07-28 DIAGNOSIS — E119 Type 2 diabetes mellitus without complications: Secondary | ICD-10-CM | POA: Insufficient documentation

## 2015-07-28 DIAGNOSIS — I481 Persistent atrial fibrillation: Secondary | ICD-10-CM | POA: Diagnosis not present

## 2015-07-28 DIAGNOSIS — I4891 Unspecified atrial fibrillation: Secondary | ICD-10-CM | POA: Diagnosis present

## 2015-07-28 NOTE — Progress Notes (Signed)
Patient ID: [name], male   DOB: 05-31-1951, 65 y.o.   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 male with a h/o afib that was first diagnosed 14 years ago. 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 Over the years afib has become persistent. He recently wore a 48 hour monitor that showed persistent afib with minimum heart rate of 59 bpm, mean heart beat of 90 bpm, max of 116. He does have periods of slow heart beat with pauses but these are mostly associated with sleeping, asymptomatic, and he is pending a sleep study tomorrow. Some days he does admit to more fatigue and is generally associated with faster heart beat. He currently is on warfarin for chadsvasc score of at least 2(htn, DM), metorpolol for rate control. Has not used AAD's in the past.  Does not use alcohol, or tobacco products, denies snoring issues, feels rested on waking up. Moderate caffeine use. Wife has been in a nursing facility for years for dementia  and he visits her daily. No regular exercise program.   He complains of some weeping of fluid from lower extremities for days now, which has occurred as well  in the past.  Today, he denies symptoms of palpitations, chest pain, shortness of breath, orthopnea, PND, lower extremity edema, dizziness, presyncope, syncope, or neurologic sequela. The patient is tolerating medications without difficulties and is otherwise without complaint today.   Past Medical History  Diagnosis Date  . Hypertension   . Diabetes mellitus   . Hypotension   . GERD (gastroesophageal reflux disease)   . Sleep apnea   . tonsillar ca dx'd 02/2006    xrt/chemo comp 05/2006   Past Surgical History  Procedure Laterality Date  . Breast surgery    . Cardiac catheterization    . Colonoscopy with propofol N/A 06/03/2015    Procedure: COLONOSCOPY WITH PROPOFOL;   Surgeon: Laurence Spates, MD;  Location: [facility];  Service: Endoscopy;  Laterality: N/A;  . Hot hemostasis N/A 06/03/2015    Procedure: HOT HEMOSTASIS (ARGON PLASMA COAGULATION/BICAP);  Surgeon: Laurence Spates, MD;  Location: [facility];  Service: Endoscopy;  Laterality: N/A;    Current Outpatient Prescriptions  Medication Sig Dispense Refill  . aspirin 325 MG tablet Take 325 mg by mouth daily.    Marland Kitchen docusate sodium (COLACE) 100 MG capsule Take 100 mg by mouth 2 (two) times daily as needed. For stool softener.    Marland Kitchen glimepiride (AMARYL) 1 MG tablet Take 1 mg by mouth daily with breakfast.    . hydrochlorothiazide (HYDRODIURIL) 25 MG tablet Take 25 mg by mouth daily.    Marland Kitchen levothyroxine (SYNTHROID, LEVOTHROID) 112 MCG tablet Take 112 mcg by mouth daily.    Marland Kitchen liothyronine (CYTOMEL) 5 MCG tablet Take 5 mcg by mouth daily.    . metFORMIN (GLUCOPHAGE) 1000 MG tablet Take 1,000 mg by mouth 2 (two) times daily with a meal.    . metoprolol succinate (TOPROL-XL) 50 MG 24 hr tablet TAKE ONE TABLET BY MOUTH IN THE MORNING AND TAKE ONE-HALF TABLET BY MOUTH IN THE EVENING 45 tablet 10  . Multiple Vitamin (MULITIVITAMIN WITH MINERALS) TABS Take 1 tablet by mouth daily.    . Omega-3 Fatty Acids (FISH OIL PO) Take 2 capsules by mouth daily.    . pravastatin (PRAVACHOL) 80 MG tablet Take 80 mg by mouth at  bedtime.     . quinapril (ACCUPRIL) 40 MG tablet Take 20-40 mg by mouth 2 (two) times daily. Take 1 tablet in AM, and 1/2 tablet in evening    . ranitidine (ZANTAC) 150 MG tablet Take 150 mg by mouth daily.    Marland Kitchen warfarin (COUMADIN) 5 MG tablet TAKE AS DIRECTED BY  ANTICOAGULATION 30 tablet 3   No current facility-administered medications for this encounter.    Allergies  Allergen Reactions  . Nsaids Other (See Comments)    REACTION: PER MD REQUEST NOT TO TAKE    Social History   Social History  . Marital Status: Married    Spouse Name: N/A  . Number of Children: N/A  . Years of Education: N/A    Occupational History  . Not on file.   Social History Main Topics  . Smoking status: Never Smoker   . Smokeless tobacco: Never Used  . Alcohol Use: No  . Drug Use: No  . Sexual Activity: Not on file   Other Topics Concern  . Not on file   Social History Narrative    Family History  Problem Relation Age of Onset  . Coronary artery disease Father     ROS- All systems are reviewed and negative except as per the HPI above  Physical Exam: Filed Vitals:   07/27/15 1020  BP: 134/76  Pulse: 84  Height: 6\' 4"  (1.93 m)  Weight: 268 lb 12.8 oz (121.927 kg)    GEN- The patient is well appearing, alert and oriented x 3 today.   Head- normocephalic, atraumatic Eyes-  Sclera clear, conjunctiva pink Ears- hearing intact Oropharynx- clear Neck- supple, no JVP Lymph- no cervical lymphadenopathy Lungs- Clear to ausculation bilaterally, normal work of breathing Heart- Regular rate and rhythm, no murmurs, rubs or gallops, PMI not laterally displaced GI- soft, NT, ND, + BS Extremities- no clubbing, cyanosis, typical skin changes of LE associated with diabetes. Healing scrape rt anterior shin inferior to knee.Very small lesions one per leg, weeping. Do not appear infected. 1-2+ edema. MS- no significant deformity or atrophy Skin- no rash or lesion Psych- euthymic mood, full affect Neuro- strength and sensation are intact  EKG- afib with v rate of 84 bpm, qrs int 78 ms, qtc 415 ms Epic records reviewed  Assessment and Plan:  1. Afib Appears to be persistent by recent monitor, rate fairly well controlled but does have a tendency toward bradycardia which is a consideration with choosing antiarrhythmics. Continue metoprolol Most of the bradycardia/pauses ( less than 3 seconds) appear at night Sleep study pending Echo pending  2. LEE Chronic, referred to MD following diabetes.  F/u in 2 weeks and with results of echo, sleep study with discuss  Antiarrythmic therapy, favoring  tikosyn since will not contribute to bradycardia.   Geroge Baseman Annaly Skop, [facility] [address] [place], [place] [address] [phone]

## 2015-07-28 NOTE — Progress Notes (Signed)
Echocardiogram 2D Echocardiogram has been performed.  Tresa Res 07/28/2015, 2:29 PM

## 2015-07-29 DIAGNOSIS — I872 Venous insufficiency (chronic) (peripheral): Secondary | ICD-10-CM | POA: Diagnosis not present

## 2015-07-29 MED ORDER — METOPROLOL SUCCINATE ER 100 MG PO TB24: 100.0000 mg | ORAL_TABLET | Freq: Every day | ORAL | Status: DC

## 2015-07-29 NOTE — Telephone Encounter (Signed)
-----   Message from Belva Crome, MD sent at 07/29/2015  1:25 PM EST ----- HR control is borderline. Increase metoprolol Succinate to 100 mg daily.

## 2015-07-29 NOTE — Telephone Encounter (Signed)
Pt aware of holter results and Dr.Smith's recommendation. HR control is borderline. Increase metoprolol Succinate to 100 mg daily Rx sent to pt pharmacy. Pt verbalized understanding. Pt will f/u with the Afib clinic as planned.

## 2015-07-29 NOTE — Telephone Encounter (Signed)
Follow up   Please return pt call

## 2015-07-30 ENCOUNTER — Telehealth: Payer: Self-pay | Admitting: *Deleted

## 2015-07-30 ENCOUNTER — Encounter: Payer: Self-pay | Admitting: Interventional Cardiology

## 2015-07-30 NOTE — Telephone Encounter (Signed)
error 

## 2015-08-03 ENCOUNTER — Encounter (HOSPITAL_COMMUNITY): Payer: Self-pay | Admitting: Nurse Practitioner

## 2015-08-04 ENCOUNTER — Ambulatory Visit (HOSPITAL_COMMUNITY)
Admission: RE | Admit: 2015-08-04 | Discharge: 2015-08-04 | Disposition: A | Discharge status: Home / Self Care | Payer: Medicare Other | Source: Ambulatory Visit | Attending: Nurse Practitioner | Admitting: Nurse Practitioner

## 2015-08-04 ENCOUNTER — Encounter (HOSPITAL_COMMUNITY): Payer: Self-pay | Admitting: Nurse Practitioner

## 2015-08-04 VITALS — BP 130/80 | HR 79 | Ht 76.0 in | Wt 267.2 lb

## 2015-08-04 DIAGNOSIS — Z7984 Long term (current) use of oral hypoglycemic drugs: Secondary | ICD-10-CM | POA: Diagnosis not present

## 2015-08-04 DIAGNOSIS — I4819 Other persistent atrial fibrillation: Secondary | ICD-10-CM

## 2015-08-04 DIAGNOSIS — E119 Type 2 diabetes mellitus without complications: Secondary | ICD-10-CM | POA: Diagnosis not present

## 2015-08-04 DIAGNOSIS — Z9221 Personal history of antineoplastic chemotherapy: Secondary | ICD-10-CM | POA: Insufficient documentation

## 2015-08-04 DIAGNOSIS — Z923 Personal history of irradiation: Secondary | ICD-10-CM | POA: Insufficient documentation

## 2015-08-04 DIAGNOSIS — Z7982 Long term (current) use of aspirin: Secondary | ICD-10-CM | POA: Diagnosis not present

## 2015-08-04 DIAGNOSIS — Z79899 Other long term (current) drug therapy: Secondary | ICD-10-CM | POA: Diagnosis not present

## 2015-08-04 DIAGNOSIS — Z8249 Family history of ischemic heart disease and other diseases of the circulatory system: Secondary | ICD-10-CM | POA: Diagnosis not present

## 2015-08-04 DIAGNOSIS — R001 Bradycardia, unspecified: Secondary | ICD-10-CM | POA: Insufficient documentation

## 2015-08-04 DIAGNOSIS — I4891 Unspecified atrial fibrillation: Secondary | ICD-10-CM | POA: Diagnosis not present

## 2015-08-04 DIAGNOSIS — Z7901 Long term (current) use of anticoagulants: Secondary | ICD-10-CM | POA: Diagnosis not present

## 2015-08-04 DIAGNOSIS — R600 Localized edema: Secondary | ICD-10-CM | POA: Insufficient documentation

## 2015-08-04 DIAGNOSIS — I10 Essential (primary) hypertension: Secondary | ICD-10-CM | POA: Insufficient documentation

## 2015-08-04 DIAGNOSIS — K219 Gastro-esophageal reflux disease without esophagitis: Secondary | ICD-10-CM | POA: Insufficient documentation

## 2015-08-04 DIAGNOSIS — Z8589 Personal history of malignant neoplasm of other organs and systems: Secondary | ICD-10-CM | POA: Diagnosis not present

## 2015-08-04 DIAGNOSIS — R0683 Snoring: Secondary | ICD-10-CM | POA: Diagnosis not present

## 2015-08-04 DIAGNOSIS — I481 Persistent atrial fibrillation: Secondary | ICD-10-CM | POA: Diagnosis not present

## 2015-08-04 LAB — BASIC METABOLIC PANEL
Anion gap: 12 (ref 5–15)
BUN: 20 mg/dL (ref 6–20)
CALCIUM: 9.4 mg/dL (ref 8.9–10.3)
CHLORIDE: 104 mmol/L (ref 101–111)
CO2: 24 mmol/L (ref 22–32)
CREATININE: 1.47 mg/dL — AB (ref 0.61–1.24)
GFR, EST AFRICAN AMERICAN: 57 mL/min — AB (ref >60)
GFR, EST NON AFRICAN AMERICAN: 49 mL/min — AB (ref >60)
Glucose, Bld: 142 mg/dL — ABNORMAL HIGH (ref 65–99)
Potassium: 4.3 mmol/L (ref 3.5–5.1)
SODIUM: 140 mmol/L (ref 135–145)

## 2015-08-04 LAB — MAGNESIUM: MAGNESIUM: 1.7 mg/dL (ref 1.7–2.4)

## 2015-08-04 NOTE — Progress Notes (Signed)
Patient ID: [name], male   DOB: May 09, 1951, 65 y.o.   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 male with a h/o afib that was first diagnosed 14 years ago. 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 Over the years afib has become persistent. He recently wore a 48 hour monitor that showed persistent afib with minimum heart rate of 59 bpm, mean heart beat of 90 bpm, max of 116. He does have periods of slow heart beat with pauses but these are mostly associated with sleeping, asymptomatic, and he is pending a sleep study tomorrow. Some days he does admit to more fatigue and is generally associated with faster heart beat. He currently is on warfarin for chadsvasc score of at least 2(htn, DM), metorpolol for rate control. Has not used AAD's in the past.  Does not use alcohol, or tobacco products, denies snoring issues, feels rested on waking up. Moderate caffeine use. Wife has been in a nursing facility for years for dementia  and he visits her daily. No regular exercise program. He complained on last visit of some weeping of fluid from lower extremities for days now, which has occurred as well  in the past. He started wearing support knee high socks and this resolved.  Echo performed 07/28/15 revealed normal systolic function, with moderate concentric hypertrophy, normal wall motion, with  Left atrium 50 mm. He continues to be symptomatic with exertional dyspnea, fatigue associated with afib. Sleep study pending tomorrow night.   Today, he denies symptoms of palpitations, chest pain, shortness of breath, orthopnea, PND, lower extremity edema, dizziness, presyncope, syncope, or neurologic sequela. The patient is tolerating medications without difficulties and is otherwise without complaint today.   Past Medical History  Diagnosis Date  . Hypertension   . Diabetes  mellitus   . Hypotension   . GERD (gastroesophageal reflux disease)   . Sleep apnea   . tonsillar ca dx'd 02/2006    xrt/chemo comp 05/2006   Past Surgical History  Procedure Laterality Date  . Breast surgery    . Cardiac catheterization    . Colonoscopy with propofol N/A 06/03/2015    Procedure: COLONOSCOPY WITH PROPOFOL;  Surgeon: Laurence Spates, MD;  Location: [facility];  Service: Endoscopy;  Laterality: N/A;  . Hot hemostasis N/A 06/03/2015    Procedure: HOT HEMOSTASIS (ARGON PLASMA COAGULATION/BICAP);  Surgeon: Laurence Spates, MD;  Location: [facility];  Service: Endoscopy;  Laterality: N/A;    Current Outpatient Prescriptions  Medication Sig Dispense Refill  . aspirin 325 MG tablet Take 325 mg by mouth daily.    Marland Kitchen docusate sodium (COLACE) 100 MG capsule Take 100 mg by mouth 2 (two) times daily as needed. For stool softener.    Marland Kitchen glimepiride (AMARYL) 1 MG tablet Take 1 mg by mouth daily with breakfast.    . hydrochlorothiazide (HYDRODIURIL) 25 MG tablet Take 25 mg by mouth daily.    Marland Kitchen levothyroxine (SYNTHROID, LEVOTHROID) 112 MCG tablet Take 112 mcg by mouth daily.    Marland Kitchen liothyronine (CYTOMEL) 5 MCG tablet Take 5 mcg by mouth daily.    . metFORMIN (GLUCOPHAGE) 1000 MG tablet Take 1,000 mg by mouth 2 (two) times daily with a meal.    . metoprolol succinate (TOPROL-XL) 100 MG 24 hr tablet Take 1 tablet (100 mg total) by mouth daily. Take with or immediately following a  meal. 30 tablet 11  . Multiple Vitamin (MULITIVITAMIN WITH MINERALS) TABS Take 1 tablet by mouth daily.    . Omega-3 Fatty Acids (FISH OIL PO) Take 2 capsules by mouth daily.    . pravastatin (PRAVACHOL) 80 MG tablet Take 80 mg by mouth at bedtime.     . quinapril (ACCUPRIL) 40 MG tablet Take 20-40 mg by mouth 2 (two) times daily. Take 1 tablet in AM, and 1/2 tablet in evening    . ranitidine (ZANTAC) 150 MG tablet Take 150 mg by mouth daily.    Marland Kitchen warfarin (COUMADIN) 5 MG tablet TAKE AS DIRECTED BY  ANTICOAGULATION 30  tablet 3   No current facility-administered medications for this encounter.    Allergies  Allergen Reactions  . Nsaids Other (See Comments)    REACTION: PER MD REQUEST NOT TO TAKE    Social History   Social History  . Marital Status: Married    Spouse Name: N/A  . Number of Children: N/A  . Years of Education: N/A   Occupational History  . Not on file.   Social History Main Topics  . Smoking status: Never Smoker   . Smokeless tobacco: Never Used  . Alcohol Use: No  . Drug Use: No  . Sexual Activity: Not on file   Other Topics Concern  . Not on file   Social History Narrative    Family History  Problem Relation Age of Onset  . Coronary artery disease Father     ROS- All systems are reviewed and negative except as per the HPI above  Physical Exam: Filed Vitals:   08/04/15 1452  BP: 130/80  Pulse: 79  Height: 6\' 4"  (1.93 m)  Weight: 267 lb 3.2 oz (121.201 kg)    GEN- The patient is well appearing, alert and oriented x 3 today.   Head- normocephalic, atraumatic Eyes-  Sclera clear, conjunctiva pink Ears- hearing intact Oropharynx- clear Neck- supple, no JVP Lymph- no cervical lymphadenopathy Lungs- Clear to ausculation bilaterally, normal work of breathing Heart- Regular rate and rhythm, no murmurs, rubs or gallops, PMI not laterally displaced GI- soft, NT, ND, + BS Extremities- no clubbing, cyanosis, trace edema MS- no significant deformity or atrophy Skin- no rash or lesion Psych- euthymic mood, full affect Neuro- strength and sensation are intact  EKG- afib with v rate 79 bpm, qrs int 79 ms qtc 408 ms Epic records reviewed  Assessment and Plan:  1. Afib Appears to be persistent by recent monitor, rate fairly well controlled but does have a tendency toward bradycardia, mostly at night and may be associated with snoring  Continue metoprolol Most of the bradycardia/pauses ( less than 3 seconds) appear at night Sleep study pending  2.  LEE Improved from last visit  Discussed antiarrythmic therapy again, he may be OK for flecainide. Will review results of echo / mod LVH with Dr. [name] and if appropriate will contact pt and start flecainide with appropriate f/u. If not flecainide then tikosyn next best choice.  Geroge Baseman Desiray Orchard, [facility] [address] [place], [place] [address] [phone]

## 2015-08-05 DIAGNOSIS — G4733 Obstructive sleep apnea (adult) (pediatric): Secondary | ICD-10-CM | POA: Diagnosis not present

## 2015-08-05 LAB — EXERCISE TOLERANCE TEST
CSEPED: 2 min
CSEPEDS: 37 s
CSEPEW: 4.6 METS
MPHR: 157 {beats}/min
Peak HR: 129 {beats}/min
Rest HR: 76 {beats}/min

## 2015-08-11 ENCOUNTER — Ambulatory Visit (INDEPENDENT_AMBULATORY_CARE_PROVIDER_SITE_OTHER): Payer: Medicare Other | Admitting: Surgery

## 2015-08-11 DIAGNOSIS — I4891 Unspecified atrial fibrillation: Secondary | ICD-10-CM | POA: Diagnosis not present

## 2015-08-11 DIAGNOSIS — I480 Paroxysmal atrial fibrillation: Secondary | ICD-10-CM

## 2015-08-11 DIAGNOSIS — Z5181 Encounter for therapeutic drug level monitoring: Secondary | ICD-10-CM

## 2015-08-11 LAB — POCT INR: INR: 2.5

## 2015-08-24 ENCOUNTER — Ambulatory Visit (HOSPITAL_COMMUNITY)
Admission: RE | Admit: 2015-08-24 | Discharge: 2015-08-24 | Disposition: A | Discharge status: Home / Self Care | Payer: Medicare Other | Source: Ambulatory Visit | Attending: Nurse Practitioner | Admitting: Nurse Practitioner

## 2015-08-24 VITALS — BP 120/78 | HR 76 | Ht 76.0 in | Wt 261.8 lb

## 2015-08-24 DIAGNOSIS — Z7984 Long term (current) use of oral hypoglycemic drugs: Secondary | ICD-10-CM | POA: Diagnosis not present

## 2015-08-24 DIAGNOSIS — Z7982 Long term (current) use of aspirin: Secondary | ICD-10-CM | POA: Diagnosis not present

## 2015-08-24 DIAGNOSIS — Z923 Personal history of irradiation: Secondary | ICD-10-CM | POA: Insufficient documentation

## 2015-08-24 DIAGNOSIS — K219 Gastro-esophageal reflux disease without esophagitis: Secondary | ICD-10-CM | POA: Diagnosis not present

## 2015-08-24 DIAGNOSIS — G4730 Sleep apnea, unspecified: Secondary | ICD-10-CM | POA: Insufficient documentation

## 2015-08-24 DIAGNOSIS — Z79899 Other long term (current) drug therapy: Secondary | ICD-10-CM | POA: Insufficient documentation

## 2015-08-24 DIAGNOSIS — Z8589 Personal history of malignant neoplasm of other organs and systems: Secondary | ICD-10-CM | POA: Insufficient documentation

## 2015-08-24 DIAGNOSIS — R600 Localized edema: Secondary | ICD-10-CM | POA: Diagnosis not present

## 2015-08-24 DIAGNOSIS — Z8249 Family history of ischemic heart disease and other diseases of the circulatory system: Secondary | ICD-10-CM | POA: Insufficient documentation

## 2015-08-24 DIAGNOSIS — E119 Type 2 diabetes mellitus without complications: Secondary | ICD-10-CM | POA: Insufficient documentation

## 2015-08-24 DIAGNOSIS — I481 Persistent atrial fibrillation: Secondary | ICD-10-CM | POA: Insufficient documentation

## 2015-08-24 DIAGNOSIS — Z9221 Personal history of antineoplastic chemotherapy: Secondary | ICD-10-CM | POA: Diagnosis not present

## 2015-08-24 DIAGNOSIS — Z7901 Long term (current) use of anticoagulants: Secondary | ICD-10-CM | POA: Insufficient documentation

## 2015-08-24 DIAGNOSIS — I10 Essential (primary) hypertension: Secondary | ICD-10-CM | POA: Diagnosis not present

## 2015-08-24 DIAGNOSIS — I4819 Other persistent atrial fibrillation: Secondary | ICD-10-CM

## 2015-08-24 NOTE — Progress Notes (Signed)
Patient ID: [name], male   DOB: [birth year], 65 y.o.   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 male with a h/o afib that was first diagnosed 14 years ago. 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 Over the years afib has become persistent. He recently wore a 48 hour monitor that showed persistent afib with minimum heart rate of 59 bpm, mean heart beat of 90 bpm, max of 116. He does have periods of slow heart beat with pauses but these are mostly associated with sleeping, asymptomatic, and he is pending a sleep study tomorrow. Some days he does admit to more fatigue and is generally associated with faster heart beat. He currently is on warfarin for chadsvasc score of at least 2(htn, DM), metorpolol for rate control. Has not used AAD's in the past.  Does not use alcohol, or tobacco products, denies snoring issues, feels rested on waking up. Moderate caffeine use. Wife has been in a nursing facility for years with dementia  and he visits her daily. No regular exercise program. He complained on last visit of some weeping of fluid from lower extremities for days now, which has occurred as well  in the past. He started wearing support knee high socks and this resolved.  Echo performed 07/28/15 revealed normal systolic function, with moderate concentric hypertrophy, normal wall motion, with  Left atrium 50 mm. He continues to be symptomatic with exertional dyspnea, fatigue associated with afib. Sleep study pending.  He returns today to discuss antiarrythmic therapy for symptomatic persistent afib Pt is very concerned re having to swallow the pills. The majority of his meds he has to cut in half or crush to be able to swallow them. He has very little salvia due to prior throat CA/radiation and sometimes will think he has swallowed pills but coughs several hours later and  will cough up his pills.   Today, he denies symptoms of palpitations, chest pain, shortness of breath, orthopnea, PND, lower extremity edema, dizziness, presyncope, syncope, or neurologic sequela. The patient is tolerating medications without difficulties and is otherwise without complaint today.   Past Medical History  Diagnosis Date  . Hypertension   . Diabetes mellitus   . Hypotension   . GERD (gastroesophageal reflux disease)   . Sleep apnea   . tonsillar ca dx'd 02/2006    xrt/chemo comp 05/2006   Past Surgical History  Procedure Laterality Date  . Breast surgery    . Cardiac catheterization    . Colonoscopy with propofol N/A 06/03/2015    Procedure: COLONOSCOPY WITH PROPOFOL;  Surgeon: Laurence Spates, MD;  Location: [facility];  Service: Endoscopy;  Laterality: N/A;  . Hot hemostasis N/A 06/03/2015    Procedure: HOT HEMOSTASIS (ARGON PLASMA COAGULATION/BICAP);  Surgeon: Laurence Spates, MD;  Location: [facility];  Service: Endoscopy;  Laterality: N/A;    Current Outpatient Prescriptions  Medication Sig Dispense Refill  . aspirin 325 MG tablet Take 325 mg by mouth daily.    Marland Kitchen docusate sodium (COLACE) 100 MG capsule Take 100 mg by mouth 2 (two) times daily as needed. For stool softener.    Marland Kitchen glimepiride (AMARYL) 1 MG tablet Take 1 mg by mouth daily with breakfast.    . hydrochlorothiazide (HYDRODIURIL) 25 MG tablet Take 25 mg by mouth daily.    Marland Kitchen levothyroxine (SYNTHROID, LEVOTHROID) 112 MCG tablet Take  112 mcg by mouth daily.    Marland Kitchen liothyronine (CYTOMEL) 5 MCG tablet Take 5 mcg by mouth daily.    . metFORMIN (GLUCOPHAGE) 1000 MG tablet Take 1,000 mg by mouth 2 (two) times daily with a meal.    . metoprolol succinate (TOPROL-XL) 100 MG 24 hr tablet Take 1 tablet (100 mg total) by mouth daily. Take with or immediately following a meal. 30 tablet 11  . Multiple Vitamin (MULITIVITAMIN WITH MINERALS) TABS Take 1 tablet by mouth daily.    . Omega-3 Fatty Acids (FISH OIL PO) Take 2  capsules by mouth daily.    . pravastatin (PRAVACHOL) 80 MG tablet Take 80 mg by mouth at bedtime.     . quinapril (ACCUPRIL) 40 MG tablet Take 20-40 mg by mouth 2 (two) times daily. Take 1 tablet in AM, and 1/2 tablet in evening    . ranitidine (ZANTAC) 150 MG tablet Take 150 mg by mouth daily.    Marland Kitchen warfarin (COUMADIN) 5 MG tablet TAKE AS DIRECTED BY  ANTICOAGULATION 30 tablet 3   No current facility-administered medications for this encounter.    Allergies  Allergen Reactions  . Nsaids Other (See Comments)    REACTION: PER MD REQUEST NOT TO TAKE    Social History   Social History  . Marital Status: Married    Spouse Name: N/A  . Number of Children: N/A  . Years of Education: N/A   Occupational History  . Not on file.   Social History Main Topics  . Smoking status: Never Smoker   . Smokeless tobacco: Never Used  . Alcohol Use: No  . Drug Use: No  . Sexual Activity: Not on file   Other Topics Concern  . Not on file   Social History Narrative    Family History  Problem Relation Age of Onset  . Coronary artery disease Father     ROS- All systems are reviewed and negative except as per the HPI above  Physical Exam: Filed Vitals:   08/24/15 1502  BP: 120/78  Pulse: 76  Height: 6\' 4"  (1.93 m)  Weight: 261 lb 12.8 oz (118.752 kg)    GEN- The patient is well appearing, alert and oriented x 3 today.   Head- normocephalic, atraumatic Eyes-  Sclera clear, conjunctiva pink Ears- hearing intact Oropharynx- clear Neck- supple, no JVP Lymph- no cervical lymphadenopathy Lungs- Clear to ausculation bilaterally, normal work of breathing Heart- Irregular rate and rhythm, no murmurs, rubs or gallops, PMI not laterally displaced GI- soft, NT, ND, + BS Extremities- no clubbing, cyanosis, trace edema MS- no significant deformity or atrophy Skin- no rash or lesion Psych- euthymic mood, full affect Neuro- strength and sensation are intact  EKG- afib with v rate 76 bpm,  qrs int 82 ms qtc 441 ms Epic records reviewed  Assessment and Plan:  1. Symptomatic persistent Afib Appears to be persistent by recent monitor, rate fairly well controlled but does have a tendency toward bradycardia, mostly at night and may be associated with snoring  Continue metoprolol Most of the bradycardia/pauses ( less than 3 seconds) appear at night Sleep study pending AAD therapy discussed Pt is very concerned re the ability to swallow antiarrythmic's, if he can't crush tablets or open capsules, which would be contraindicated.  I will refer to see Dr. [name] with his issues with pills and swallowing and see if he might be a candidate for ablation, however left atrial size is 50 mm on recent echo. Continue warfarin  2. LEE Improved from last visit  3. HTN  Stable  Alexea Blase C. Miguelina Fore, [facility] [address] [place], [place] [address] [phone]

## 2015-09-09 ENCOUNTER — Ambulatory Visit (INDEPENDENT_AMBULATORY_CARE_PROVIDER_SITE_OTHER): Payer: Medicare Other | Admitting: Internal Medicine

## 2015-09-09 ENCOUNTER — Ambulatory Visit (INDEPENDENT_AMBULATORY_CARE_PROVIDER_SITE_OTHER): Payer: Medicare Other | Admitting: *Deleted

## 2015-09-09 ENCOUNTER — Encounter: Payer: Self-pay | Admitting: Internal Medicine

## 2015-09-09 VITALS — BP 130/84 | HR 48 | Ht 76.0 in | Wt 261.0 lb

## 2015-09-09 DIAGNOSIS — I4891 Unspecified atrial fibrillation: Secondary | ICD-10-CM | POA: Diagnosis not present

## 2015-09-09 DIAGNOSIS — I10 Essential (primary) hypertension: Secondary | ICD-10-CM | POA: Diagnosis not present

## 2015-09-09 DIAGNOSIS — Z5181 Encounter for therapeutic drug level monitoring: Secondary | ICD-10-CM

## 2015-09-09 DIAGNOSIS — I480 Paroxysmal atrial fibrillation: Secondary | ICD-10-CM

## 2015-09-09 LAB — HEPATIC FUNCTION PANEL
ALK PHOS: 54 U/L (ref 40–115)
ALT: 15 U/L (ref 9–46)
AST: 19 U/L (ref 10–35)
Albumin: 4.2 g/dL (ref 3.6–5.1)
BILIRUBIN INDIRECT: 0.4 mg/dL (ref 0.2–1.2)
Bilirubin, Direct: 0.1 mg/dL (ref <=0.2)
Total Bilirubin: 0.5 mg/dL (ref 0.2–1.2)
Total Protein: 6.8 g/dL (ref 6.1–8.1)

## 2015-09-09 LAB — TSH: TSH: 1.03 mIU/L (ref 0.40–4.50)

## 2015-09-09 LAB — POCT INR: INR: 2.6

## 2015-09-09 MED ORDER — AMIODARONE HCL 200 MG PO TABS: 200.0000 mg | ORAL_TABLET | Freq: Two times a day (BID) | ORAL | Status: DC

## 2015-09-09 MED ORDER — METOPROLOL SUCCINATE ER 50 MG PO TB24: ORAL_TABLET | ORAL | Status: DC

## 2015-09-09 NOTE — Patient Instructions (Signed)
Medication Instructions:  Your physician has recommended you make the following change in your medication:  1) Decrease Metoprolol to 75 mg daily 2) Start Amiodarone 200mg  twice daily 3) Stop Aspirin   Labwork: Your physician recommends that you return for lab work today: TSH/Liver   Testing/Procedures: None ordered   Follow-Up: Your physician recommends that you schedule a follow-up appointment in: 4 weeks with Roderic Palau, NP   Any Other Special Instructions Will Be Listed Below (If Applicable).     If you need a refill on your cardiac medications before your next appointment, please call your pharmacy.

## 2015-09-09 NOTE — Progress Notes (Signed)
Electrophysiology Office Note   Date:  09/09/2015   ID:  AMEIR GAARDER, DOB Apr 13, 1951, MRN [id number]  PCP:  Frank Pac, MD  Cardiologist:  Dr [name] Primary Electrophysiologist: Thompson Grayer, MD    Chief Complaint  Patient presents with  . Atrial Fibrillation     History of Present Illness: [name] is a 65 y.o. male who presents today for electrophysiology evaluation.   The patient has persistent atrial fibrillation with symptoms of fatigue and decreased exercise tolerance.  He is followed by Dr [name].  He has not tried AAD therapy.  He recently was evaluated in the AF clinic and is referred to me for consideration of AAD therapy.  He cannot take capsules as he has to crush pills.  He has some reservation about taking additional medicines but is willing to try.  Today, he denies symptoms of palpitations, chest pain, shortness of breath, orthopnea, PND, lower extremity edema, claudication, dizziness, presyncope, syncope, bleeding, or neurologic sequela. The patient is tolerating medications without difficulties and is otherwise without complaint today.    Past Medical History  Diagnosis Date  . Hypertension   . Diabetes mellitus   . Hypotension   . GERD (gastroesophageal reflux disease)   . Sleep apnea   . 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 CAD (coronary artery disease)     70% LCx stenosis, treated medically  . Persistent atrial fibrillation ([place])   . Left atrial enlargement    Past Surgical History  Procedure Laterality Date  . Breast surgery    . Cardiac catheterization    . Colonoscopy with propofol N/A 06/03/2015    Procedure: COLONOSCOPY WITH PROPOFOL;  Surgeon: Laurence Spates, MD;  Location: [facility]COPY;  Service: Endoscopy;  Laterality: N/A;  . Hot hemostasis N/A 06/03/2015    Procedure: HOT HEMOSTASIS (ARGON PLASMA COAGULATION/BICAP);  Surgeon: Laurence Spates, MD;  Location: [facility];  Service: Endoscopy;  Laterality: N/A;      Current Outpatient Prescriptions  Medication Sig Dispense Refill  . docusate sodium (COLACE) 100 MG capsule Take 100 mg by mouth 2 (two) times daily as needed. For stool softener.    Marland Kitchen glimepiride (AMARYL) 1 MG tablet Take 1 mg by mouth daily with breakfast.    . hydrochlorothiazide (HYDRODIURIL) 25 MG tablet Take 25 mg by mouth daily.    Marland Kitchen levothyroxine (SYNTHROID, LEVOTHROID) 112 MCG tablet Take 112 mcg by mouth daily.    Marland Kitchen liothyronine (CYTOMEL) 5 MCG tablet Take 5 mcg by mouth daily.    . metFORMIN (GLUCOPHAGE) 1000 MG tablet Take 1,000 mg by mouth 2 (two) times daily with a meal.    . metoprolol succinate (TOPROL-XL) 50 MG 24 hr tablet Take 1 1/2 tablets by mouth daily    . Multiple Vitamin (MULITIVITAMIN WITH MINERALS) TABS Take 1 tablet by mouth daily.    . Omega-3 Fatty Acids (FISH OIL PO) Take 2 capsules by mouth daily.    . pravastatin (PRAVACHOL) 80 MG tablet Take 80 mg by mouth at bedtime.     . quinapril (ACCUPRIL) 40 MG tablet Take 20-40 mg by mouth 2 (two) times daily. Take 1 tablet in AM, and 1/2 tablet in evening    . ranitidine (ZANTAC) 150 MG tablet Take 150 mg by mouth daily.    Marland Kitchen warfarin (COUMADIN) 5 MG tablet TAKE AS DIRECTED BY  ANTICOAGULATION 30 tablet 3  . amiodarone (PACERONE) 200 MG tablet Take 1 tablet (200 mg total) by mouth  2 (two) times daily. 180 tablet 3   No current facility-administered medications for this visit.    Allergies:   Nsaids   Social History:  The patient  reports that he has never smoked. He has never used smokeless tobacco. He reports that he does not drink alcohol or use illicit drugs.   Family History:  The patient's  family history includes Coronary artery disease in his father.    ROS:  Please see the history of present illness.   All other systems are reviewed and negative.    PHYSICAL EXAM: VS:  BP 130/84 mmHg  Pulse 48  Ht 6\' 4"  (1.93 m)  Wt 261 lb (118.389 kg)  BMI 31.78 kg/m2 , BMI Body mass index is 31.78  kg/(m^2). GEN: Well nourished, well developed, in no acute distress HEENT: normal Neck: no JVD  Cardiac: iRRR; no murmurs, rubs, or gallops,no edema  Respiratory:  clear to auscultation bilaterally, normal work of breathing GI: soft, nontender, nondistended, + BS MS: no deformity or atrophy Skin: warm and dry  Neuro:  Strength and sensation are intact Psych: euthymic mood, full affect  Recent Labs: 08/04/2015: BUN 20; Creatinine, Ser 1.47*; Magnesium 1.7; Potassium 4.3; Sodium 140    Lipid Panel  No results found for: CHOL, TRIG, HDL, CHOLHDL, VLDL, LDLCALC, LDLDIRECT   Wt Readings from Last 3 Encounters:  09/09/15 261 lb (118.389 kg)  08/24/15 261 lb 12.8 oz (118.752 kg)  08/04/15 267 lb 3.2 oz (121.201 kg)      Other studies Reviewed: Additional studies/ records that were reviewed today include: AF clinic notes, Dr Darliss Ridgel notes, prior cath and echo, prior ecgs   ASSESSMENT AND PLAN:  1.  Persistent afib The patient has symptomatic persistent atrial fibrillation.  He has not tried AAD therapy.  Per guidelines, he should attempt medical therapy prior to consideration of ablation.  Given LA enlargement and persistent AF his anticipated success with ablation is reduced. Given that he has to crush his medicines, his options would be flecainide or amiodarone.  As he has CAD, I would favor amiodarone. Risks, benefits, and alternatives to amiodarone were discussed at length with the patient today.  He would like to proceed. I will therefore start amiodarone 200mg  BID at this time. LFTs, TFTs today. He will follow HR at home and adjust toprol as needed with goal HR 50-100. We have informed anticoagulation clinic of his plans to start amiodarone as he is also on coumadin. He will follow-up with AF clinic in 4 weeks.  If still in AF at that time, he will require cardioversion.  2. CAD No ischemic symptoms Medically managed Will stop ASA given risks of bleeding with [place] therapy  3.  HTN Stable No change required today  Follow-up with Butch Penny in the AF clinic and with Dr [name].  I will see as needed going forward  Labs/ tests ordered today include:  Orders Placed This Encounter  Procedures  . Hepatic function panel  . TSH     Signed, Thompson Grayer, MD  09/09/2015 3:58 PM     [address] [place] [address] [phone] (office) [phone] (fax)

## 2015-09-22 ENCOUNTER — Ambulatory Visit (INDEPENDENT_AMBULATORY_CARE_PROVIDER_SITE_OTHER): Payer: Medicare Other | Admitting: *Deleted

## 2015-09-22 DIAGNOSIS — I4891 Unspecified atrial fibrillation: Secondary | ICD-10-CM

## 2015-09-22 DIAGNOSIS — I480 Paroxysmal atrial fibrillation: Secondary | ICD-10-CM

## 2015-09-22 DIAGNOSIS — Z5181 Encounter for therapeutic drug level monitoring: Secondary | ICD-10-CM

## 2015-09-22 LAB — POCT INR: INR: 2.6

## 2015-10-07 ENCOUNTER — Encounter (HOSPITAL_COMMUNITY): Payer: Self-pay | Admitting: Nurse Practitioner

## 2015-10-07 ENCOUNTER — Ambulatory Visit (INDEPENDENT_AMBULATORY_CARE_PROVIDER_SITE_OTHER): Payer: Medicare Other | Admitting: *Deleted

## 2015-10-07 ENCOUNTER — Telehealth: Payer: Self-pay | Admitting: *Deleted

## 2015-10-07 ENCOUNTER — Ambulatory Visit (HOSPITAL_COMMUNITY)
Admission: RE | Admit: 2015-10-07 | Discharge: 2015-10-07 | Disposition: A | Discharge status: Home / Self Care | Payer: Medicare Other | Source: Ambulatory Visit | Attending: Nurse Practitioner | Admitting: Nurse Practitioner

## 2015-10-07 VITALS — BP 142/90 | HR 81 | Ht 76.0 in | Wt 256.0 lb

## 2015-10-07 DIAGNOSIS — I2510 Atherosclerotic heart disease of native coronary artery without angina pectoris: Secondary | ICD-10-CM | POA: Diagnosis not present

## 2015-10-07 DIAGNOSIS — K219 Gastro-esophageal reflux disease without esophagitis: Secondary | ICD-10-CM | POA: Insufficient documentation

## 2015-10-07 DIAGNOSIS — Z7984 Long term (current) use of oral hypoglycemic drugs: Secondary | ICD-10-CM | POA: Diagnosis not present

## 2015-10-07 DIAGNOSIS — I4891 Unspecified atrial fibrillation: Secondary | ICD-10-CM | POA: Diagnosis not present

## 2015-10-07 DIAGNOSIS — Z7901 Long term (current) use of anticoagulants: Secondary | ICD-10-CM | POA: Diagnosis not present

## 2015-10-07 DIAGNOSIS — Z0189 Encounter for other specified special examinations: Secondary | ICD-10-CM | POA: Diagnosis not present

## 2015-10-07 DIAGNOSIS — G4730 Sleep apnea, unspecified: Secondary | ICD-10-CM | POA: Insufficient documentation

## 2015-10-07 DIAGNOSIS — I10 Essential (primary) hypertension: Secondary | ICD-10-CM | POA: Insufficient documentation

## 2015-10-07 DIAGNOSIS — E119 Type 2 diabetes mellitus without complications: Secondary | ICD-10-CM | POA: Insufficient documentation

## 2015-10-07 DIAGNOSIS — I481 Persistent atrial fibrillation: Secondary | ICD-10-CM | POA: Insufficient documentation

## 2015-10-07 DIAGNOSIS — I480 Paroxysmal atrial fibrillation: Secondary | ICD-10-CM | POA: Diagnosis not present

## 2015-10-07 DIAGNOSIS — I517 Cardiomegaly: Secondary | ICD-10-CM | POA: Diagnosis not present

## 2015-10-07 DIAGNOSIS — Z79899 Other long term (current) drug therapy: Secondary | ICD-10-CM | POA: Diagnosis not present

## 2015-10-07 DIAGNOSIS — Z5181 Encounter for therapeutic drug level monitoring: Secondary | ICD-10-CM

## 2015-10-07 DIAGNOSIS — I4819 Other persistent atrial fibrillation: Secondary | ICD-10-CM

## 2015-10-07 LAB — BASIC METABOLIC PANEL
Anion gap: 9 (ref 5–15)
BUN: 21 mg/dL — ABNORMAL HIGH (ref 6–20)
CO2: 26 mmol/L (ref 22–32)
Calcium: 9.5 mg/dL (ref 8.9–10.3)
Chloride: 104 mmol/L (ref 101–111)
Creatinine, Ser: 1.55 mg/dL — ABNORMAL HIGH (ref 0.61–1.24)
GFR calc non Af Amer: 46 mL/min — ABNORMAL LOW (ref >60)
GFR, EST AFRICAN AMERICAN: 53 mL/min — AB (ref >60)
Glucose, Bld: 75 mg/dL (ref 65–99)
POTASSIUM: 4.1 mmol/L (ref 3.5–5.1)
SODIUM: 139 mmol/L (ref 135–145)

## 2015-10-07 LAB — CBC
HEMATOCRIT: 45.4 % (ref 39.0–52.0)
HEMOGLOBIN: 15.2 g/dL (ref 13.0–17.0)
MCH: 30.1 pg (ref 26.0–34.0)
MCHC: 33.5 g/dL (ref 30.0–36.0)
MCV: 89.9 fL (ref 78.0–100.0)
Platelets: 182 10*3/uL (ref 150–400)
RBC: 5.05 MIL/uL (ref 4.22–5.81)
RDW: 13.2 % (ref 11.5–15.5)
WBC: 6.3 10*3/uL (ref 4.0–10.5)

## 2015-10-07 LAB — POCT INR: INR: 4.7

## 2015-10-07 NOTE — Progress Notes (Signed)
Patient ID: [name], male   DOB: Oct 06, 1950, 65 y.o.   MRN: [id number]        Electrophysiology Office Note   Date:  10/07/2015   ID:  [name], DOB 1950-07-01, MRN [id number]  PCP:  Gennette Pac, MD  Cardiologist:  Dr [name] Primary Electrophysiologist: Roderic Palau, NP    Chief Complaint  Patient presents with  . Atrial Fibrillation     History of Present Illness: [name] is a 65 y.o. male who presents today for electrophysiology evaluation.   The patient has persistent atrial fibrillation with symptoms of fatigue and decreased exercise tolerance.  He is followed by Dr [name].  He has not tried AAD therapy.  He recently was evaluated in the AF clinic and is referred to me for consideration of AAD therapy.  He cannot take capsules as he has to crush pills.  He has some reservation about taking additional medicines but is willing to try.  He saw Dr. [name] and further discussed antiarrythmic's. Due to trouble swallowing and possibly having to crush pill, he recommended flecainide or amiodarone with the pt deciding on amiodarone. He has been loading x one month at 200 mg bid and returns today in aflutter, rate controlled. He is tolerating amiodarone but has noticed ovr the last few nights having some vivid dreams. Per Dr. Jackalyn Lombard plan, he was to load x one month and if not converted by drug to undergo cardioversion. The procedure was explained to pt and he would like to proceed.  Today, he denies symptoms of palpitations, chest pain, shortness of breath, orthopnea, PND, lower extremity edema, claudication, dizziness, presyncope, syncope, bleeding, or neurologic sequela. The patient is tolerating medications without difficulties and is otherwise without complaint today.    Past Medical History  Diagnosis Date  . Hypertension   . Diabetes mellitus   . Hypotension   . GERD (gastroesophageal reflux disease)   . Sleep apnea   . 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 CAD (coronary artery disease)     70% LCx stenosis, treated medically  . Persistent atrial fibrillation ([place])   . Left atrial enlargement    Past Surgical History  Procedure Laterality Date  . Breast surgery    . Cardiac catheterization    . Colonoscopy with propofol N/A 06/03/2015    Procedure: COLONOSCOPY WITH PROPOFOL;  Surgeon: Laurence Spates, MD;  Location: [facility];  Service: Endoscopy;  Laterality: N/A;  . Hot hemostasis N/A 06/03/2015    Procedure: HOT HEMOSTASIS (ARGON PLASMA COAGULATION/BICAP);  Surgeon: Laurence Spates, MD;  Location: [facility];  Service: Endoscopy;  Laterality: N/A;     Current Outpatient Prescriptions  Medication Sig Dispense Refill  . amiodarone (PACERONE) 200 MG tablet Take 1 tablet (200 mg total) by mouth 2 (two) times daily. 180 tablet 3  . docusate sodium (COLACE) 100 MG capsule Take 100 mg by mouth 2 (two) times daily as needed. For stool softener.    Marland Kitchen glimepiride (AMARYL) 1 MG tablet Take 1 mg by mouth daily with breakfast.    . hydrochlorothiazide (HYDRODIURIL) 25 MG tablet Take 25 mg by mouth daily.    Marland Kitchen levothyroxine (SYNTHROID, LEVOTHROID) 112 MCG tablet Take 112 mcg by mouth daily.    Marland Kitchen liothyronine (CYTOMEL) 5 MCG tablet Take 5 mcg by mouth daily.    . metFORMIN (GLUCOPHAGE) 1000 MG tablet Take 1,000 mg by mouth 2 (two) times daily with a meal.    . metoprolol succinate (TOPROL-XL)  50 MG 24 hr tablet Take 1 1/2 tablets by mouth daily    . Multiple Vitamin (MULITIVITAMIN WITH MINERALS) TABS Take 1 tablet by mouth daily.    . Omega-3 Fatty Acids (FISH OIL PO) Take 2 capsules by mouth daily.    . pravastatin (PRAVACHOL) 80 MG tablet Take 80 mg by mouth at bedtime.     . quinapril (ACCUPRIL) 40 MG tablet Take 20-40 mg by mouth 2 (two) times daily. Take 1 tablet in AM, and 1/2 tablet in evening    . ranitidine (ZANTAC) 150 MG tablet Take 150 mg by mouth daily.    Marland Kitchen warfarin (COUMADIN) 5 MG tablet TAKE AS DIRECTED BY   ANTICOAGULATION 30 tablet 3   No current facility-administered medications for this encounter.    Allergies:   Nsaids   Social History:  The patient  reports that he has never smoked. He has never used smokeless tobacco. He reports that he does not drink alcohol or use illicit drugs.   Family History:  The patient's  family history includes Coronary artery disease in his father.    ROS:  Please see the history of present illness.   All other systems are reviewed and negative.    PHYSICAL EXAM: VS:  BP 142/90 mmHg  Pulse 81  Ht 6\' 4"  (1.93 m)  Wt 256 lb (116.121 kg)  BMI 31.17 kg/m2 , BMI Body mass index is 31.17 kg/(m^2). GEN: Well nourished, well developed, in no acute distress HEENT: normal Neck: no JVD  Cardiac: iRRR; no murmurs, rubs, or gallops,no edema  Respiratory:  clear to auscultation bilaterally, normal work of breathing GI: soft, nontender, nondistended, + BS MS: no deformity or atrophy Skin: warm and dry  Neuro:  Strength and sensation are intact Psych: euthymic mood, full affect  Recent Labs: 08/04/2015: BUN 20; Creatinine, Ser 1.47*; Magnesium 1.7; Potassium 4.3; Sodium 140 09/09/2015: ALT 15; TSH 1.03 10/07/2015: Hemoglobin 15.2; Platelets 182    Lipid Panel  No results found for: CHOL, TRIG, HDL, CHOLHDL, VLDL, LDLCALC, LDLDIRECT   Wt Readings from Last 3 Encounters:  10/07/15 256 lb (116.121 kg)  09/09/15 261 lb (118.389 kg)  08/24/15 261 lb 12.8 oz (118.752 kg)      INR's  08/11/15- 2.5, 09/09/15- 2.6, 3/29- 2.6, 4/13-4.7 and will have INR drawn at [address] am of D[facility]V  ASSESSMENT AND PLAN:  1.  Persistent afib The patient has symptomatic persistent atrial fibrillation.  He is now loaded on amiodarone x one month. DCCV has been set up 3/20, per Dr.Allred's plan..  Discussed with Megan, PharmD and she states if therapeutic am of procedure his INR's are sufficient to undergo DCCV without cardioversion. Decrease amiodarone to 200 mg daily after  cardioversion. Cbc/bmet today  2. CAD No ischemic symptoms Medically managed Will stop ASA given risks of bleeding with [place] therapy  3. HTN Stable No change required today  Follow-up with  AF clinic one week s/p cardioversion  Butch Penny C. Lavida Patch, [facility] [address] [place], [place] [address] [phone]

## 2015-10-07 NOTE — Patient Instructions (Signed)
Cardioversion scheduled for Thursday, April 20th  - Arrive at the [facility] and go to admitting at [place] not eat or drink anything after midnight the night prior to your procedure.  - Take all your medication with a sip of water prior to arrival.  - You will not be able to drive home after your procedure.  Decrease Amiodarone to 200mg  once a day -- after the cardioversion

## 2015-10-07 NOTE — Telephone Encounter (Signed)
Spoke with Erline Levine at A Fib clinic and she states pt is scheduled for cardioversion on April 20th and needs INR checked prior so appt scheduled at 1pm on April 20th and pt instructed about this by Erline Levine

## 2015-10-14 ENCOUNTER — Encounter (HOSPITAL_COMMUNITY): Payer: Self-pay | Admitting: *Deleted

## 2015-10-14 ENCOUNTER — Ambulatory Visit (HOSPITAL_COMMUNITY): Payer: Medicare Other | Admitting: Anesthesiology

## 2015-10-14 ENCOUNTER — Encounter (HOSPITAL_COMMUNITY)
Admission: RE | Disposition: A | Discharge status: Home / Self Care | Payer: Self-pay | Source: Ambulatory Visit | Attending: Cardiology

## 2015-10-14 ENCOUNTER — Ambulatory Visit (HOSPITAL_COMMUNITY)
Admission: RE | Admit: 2015-10-14 | Discharge: 2015-10-14 | Disposition: A | Discharge status: Home / Self Care | Payer: Medicare Other | Source: Ambulatory Visit | Attending: Cardiology | Admitting: Cardiology

## 2015-10-14 ENCOUNTER — Ambulatory Visit (INDEPENDENT_AMBULATORY_CARE_PROVIDER_SITE_OTHER): Payer: Medicare Other | Admitting: *Deleted

## 2015-10-14 DIAGNOSIS — I480 Paroxysmal atrial fibrillation: Secondary | ICD-10-CM | POA: Diagnosis not present

## 2015-10-14 DIAGNOSIS — I4892 Unspecified atrial flutter: Secondary | ICD-10-CM | POA: Diagnosis not present

## 2015-10-14 DIAGNOSIS — Z5181 Encounter for therapeutic drug level monitoring: Secondary | ICD-10-CM | POA: Diagnosis not present

## 2015-10-14 DIAGNOSIS — I4891 Unspecified atrial fibrillation: Secondary | ICD-10-CM

## 2015-10-14 DIAGNOSIS — N189 Chronic kidney disease, unspecified: Secondary | ICD-10-CM | POA: Diagnosis not present

## 2015-10-14 DIAGNOSIS — I129 Hypertensive chronic kidney disease with stage 1 through stage 4 chronic kidney disease, or unspecified chronic kidney disease: Secondary | ICD-10-CM | POA: Diagnosis not present

## 2015-10-14 HISTORY — PX: CARDIOVERSION: SHX1299

## 2015-10-14 LAB — GLUCOSE, CAPILLARY: Glucose-Capillary: 93 mg/dL (ref 65–99)

## 2015-10-14 LAB — POCT INR: INR: 2.4

## 2015-10-14 SURGERY — CARDIOVERSION
Anesthesia: General

## 2015-10-14 MED ORDER — SODIUM CHLORIDE 0.9 % IV SOLN
INTRAVENOUS | Status: DC
Start: 2015-10-14 — End: 2015-10-14
  Administered 2015-10-14: 13:00:00 via INTRAVENOUS

## 2015-10-14 MED ORDER — LIDOCAINE HCL (CARDIAC) 20 MG/ML IV SOLN
INTRAVENOUS | Status: DC | PRN
  Administered 2015-10-14: 40 mg via INTRAVENOUS

## 2015-10-14 MED ORDER — MEPERIDINE HCL 100 MG/ML IJ SOLN: 6.2500 mg | INTRAMUSCULAR | Status: DC | PRN

## 2015-10-14 MED ORDER — PROPOFOL 10 MG/ML IV BOLUS
INTRAVENOUS | Status: DC | PRN
  Administered 2015-10-14: 50 mg via INTRAVENOUS

## 2015-10-14 NOTE — Anesthesia Postprocedure Evaluation (Signed)
Anesthesia Post Note  Patient: [name]  Procedure(s) Performed: Procedure(s) (LRB): CARDIOVERSION (N/A)  Patient location during evaluation: Endoscopy Anesthesia Type: MAC Level of consciousness: awake and alert Pain management: pain level controlled Vital Signs Assessment: post-procedure vital signs reviewed and stable Respiratory status: spontaneous breathing, nonlabored ventilation, respiratory function stable and patient connected to nasal cannula oxygen Cardiovascular status: blood pressure returned to baseline and stable Postop Assessment: no signs of nausea or vomiting Anesthetic complications: no    Last Vitals:  Filed Vitals:   10/14/15 1321 10/14/15 1330  BP:  149/74  Pulse: 62 65  Temp:    Resp: 23 19    Last Pain: There were no vitals filed for this visit.               Alexis Frock

## 2015-10-14 NOTE — Discharge Instructions (Signed)
Monitored Anesthesia Care °Monitored anesthesia care is an anesthesia service for a medical procedure. Anesthesia is the loss of the ability to feel pain. It is produced by medicines called anesthetics. It may affect a small area of your body (local anesthesia), a large area of your body (regional anesthesia), or your entire body (general anesthesia). The need for monitored anesthesia care depends your procedure, your condition, and the potential need for regional or general anesthesia. It is often provided during procedures where:  °· General anesthesia may be needed if there are complications. This is because you need special care when you are under general anesthesia.   °· You will be under local or regional anesthesia. This is so that you are able to have higher levels of anesthesia if needed.   °· You will receive calming medicines (sedatives). This is especially the case if sedatives are given to put you in a semi-conscious state of relaxation (deep sedation). This is because the amount of sedative needed to produce this state can be hard to predict. Too much of a sedative can produce general anesthesia. °Monitored anesthesia care is performed by one or more health care providers who have special training in all types of anesthesia. You will need to meet with these health care providers before your procedure. During this meeting, they will ask you about your medical history. They will also give you instructions to follow. (For example, you will need to stop eating and drinking before your procedure. You may also need to stop or change medicines you are taking.) During your procedure, your health care providers will stay with you. They will:  °· Watch your condition. This includes watching your blood pressure, breathing, and level of pain.   °· Diagnose and treat problems that occur.   °· Give medicines if they are needed. These may include calming medicines (sedatives) and anesthetics.   °· Make sure you are  comfortable.   °Having monitored anesthesia care does not necessarily mean that you will be under anesthesia. It does mean that your health care providers will be able to manage anesthesia if you need it or if it occurs. It also means that you will be able to have a different type of anesthesia than you are having if you need it. When your procedure is complete, your health care providers will continue to watch your condition. They will make sure any medicines wear off before you are allowed to go home.  °  °This information is not intended to replace advice given to you by your health care provider. Make sure you discuss any questions you have with your health care provider. °  °Document Released: 03/08/2005 Document Revised: 07/03/2014 Document Reviewed: 07/24/2012 °Elsevier Interactive Patient Education ©2016 [employer] °Electrical Cardioversion, Care After °Refer to this sheet in the next few weeks. These instructions provide you with information on caring for yourself after your procedure. Your health care provider may also give you more specific instructions. Your treatment has been planned according to current medical practices, but problems sometimes occur. Call your health care provider if you have any problems or questions after your procedure. °WHAT TO EXPECT AFTER THE PROCEDURE °After your procedure, it is typical to have the following sensations: °· Some redness on the skin where the shocks were delivered. If this is tender, a sunburn lotion or hydrocortisone cream may help. °· Possible return of an abnormal heart rhythm within hours or days after the procedure. °HOME CARE INSTRUCTIONS °· Take medicines only as directed by your health care provider.   Be sure you understand how and when to take your medicine. °· Learn how to feel your pulse and check it often. °· Limit your activity for 48 hours after the procedure or as directed by your health care provider. °· Avoid or minimize caffeine and other  stimulants as directed by your health care provider. °SEEK MEDICAL CARE IF: °· You feel like your heart is beating too fast or your pulse is not regular. °· You have any questions about your medicines. °· You have bleeding that will not stop. °SEEK IMMEDIATE MEDICAL CARE IF: °· You are dizzy or feel faint. °· It is hard to breathe or you feel short of breath. °· There is a change in discomfort in your chest. °· Your speech is slurred or you have trouble moving an arm or leg on one side of your body. °· You get a serious muscle cramp that does not go away. °· Your fingers or toes turn cold or blue. °  °This information is not intended to replace advice given to you by your health care provider. Make sure you discuss any questions you have with your health care provider. °  °Document Released: 04/02/2013 Document Revised: 07/03/2014 Document Reviewed: 04/02/2013 °Elsevier Interactive Patient Education ©2016 [employer] ° °

## 2015-10-14 NOTE — H&P (Signed)
GULED VON  10/07/2015 2:30 PM  ATRIAL FIB OFFICE VISIT  MRN:  [id number]   Description: 65 year old male  Provider: Sherran Needs, NP  Department: [facility]       Diagnoses     Persistent atrial fibrillation [facility]) - Primary    ICD-9-CM: 427.31 ICD-10-CM: I48.1       Reason for Visit     Atrial Fibrillation    Reason for Visit History        Current Vitals  Most recent update: 10/07/2015 2:14 PM by Juluis Mire, RN    BP Pulse Ht Wt BMI    142/90 mmHg 81 6\' 4"  (1.93 m) 256 lb (116.121 kg) 31.17 kg/m2      BMI Data     Body Mass Index Body Surface Area    31.17 kg/m 2 2.49 m 2      Progress Notes      Sherran Needs, NP at 10/07/2015 4:02 PM     Status: Signed       Expand All Collapse All   Patient ID: [name], male DOB: [birth year], 65 y.o. MRN: [id number]       Electrophysiology Office Note   Date: 10/07/2015   ID: JAIREN TREMBLY, DOB 01-Jan-1951, MRN [id number]  PCP: Gennette Pac, MD Cardiologist: Dr [name] Primary Electrophysiologist: Roderic Palau, NP   Chief Complaint  Patient presents with  . Atrial Fibrillation    History of Present Illness: ANMOL RHOADS is a 65 y.o. male who presents today for electrophysiology evaluation. The patient has persistent atrial fibrillation with symptoms of fatigue and decreased exercise tolerance. He is followed by Dr [name]. He has not tried AAD therapy. He recently was evaluated in the AF clinic and is referred to me for consideration of AAD therapy. He cannot take capsules as he has to crush pills. He has some reservation about taking additional medicines but is willing to try.  He saw Dr. [name] and further discussed antiarrythmic's. Due to trouble swallowing and possibly having to crush pill, he recommended flecainide or amiodarone with the pt deciding on amiodarone. He has been loading x one month at 200 mg bid and returns today  in aflutter, rate controlled. He is tolerating amiodarone but has noticed ovr the last few nights having some vivid dreams. Per Dr. Jackalyn Lombard plan, he was to load x one month and if not converted by drug to undergo cardioversion. The procedure was explained to pt and he would like to proceed.  Today, he denies symptoms of palpitations, chest pain, shortness of breath, orthopnea, PND, lower extremity edema, claudication, dizziness, presyncope, syncope, bleeding, or neurologic sequela. The patient is tolerating medications without difficulties and is otherwise without complaint today.    Past Medical History  Diagnosis Date  . Hypertension   . Diabetes mellitus   . Hypotension   . GERD (gastroesophageal reflux disease)   . Sleep apnea   . 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 CAD (coronary artery disease)     70% LCx stenosis, treated medically  . Persistent atrial fibrillation ([place])   . Left atrial enlargement    Past Surgical History  Procedure Laterality Date  . Breast surgery    . Cardiac catheterization    . Colonoscopy with propofol N/A 06/03/2015    Procedure: COLONOSCOPY WITH PROPOFOL; Surgeon: Laurence Spates, MD; Location: [facility]; Service: Endoscopy; Laterality: N/A;  . Hot hemostasis N/A 06/03/2015    Procedure:  HOT HEMOSTASIS (ARGON PLASMA COAGULATION/BICAP); Surgeon: Laurence Spates, MD; Location: [facility]; Service: Endoscopy; Laterality: N/A;     Current Outpatient Prescriptions  Medication Sig Dispense Refill  . amiodarone (PACERONE) 200 MG tablet Take 1 tablet (200 mg total) by mouth 2 (two) times daily. 180 tablet 3  . docusate sodium (COLACE) 100 MG capsule Take 100 mg by mouth 2 (two) times daily as needed. For stool softener.    Marland Kitchen glimepiride (AMARYL) 1 MG tablet Take 1 mg by mouth daily with breakfast.    . hydrochlorothiazide (HYDRODIURIL) 25 MG  tablet Take 25 mg by mouth daily.    Marland Kitchen levothyroxine (SYNTHROID, LEVOTHROID) 112 MCG tablet Take 112 mcg by mouth daily.    Marland Kitchen liothyronine (CYTOMEL) 5 MCG tablet Take 5 mcg by mouth daily.    . metFORMIN (GLUCOPHAGE) 1000 MG tablet Take 1,000 mg by mouth 2 (two) times daily with a meal.    . metoprolol succinate (TOPROL-XL) 50 MG 24 hr tablet Take 1 1/2 tablets by mouth daily    . Multiple Vitamin (MULITIVITAMIN WITH MINERALS) TABS Take 1 tablet by mouth daily.    . Omega-3 Fatty Acids (FISH OIL PO) Take 2 capsules by mouth daily.    . pravastatin (PRAVACHOL) 80 MG tablet Take 80 mg by mouth at bedtime.     . quinapril (ACCUPRIL) 40 MG tablet Take 20-40 mg by mouth 2 (two) times daily. Take 1 tablet in AM, and 1/2 tablet in evening    . ranitidine (ZANTAC) 150 MG tablet Take 150 mg by mouth daily.    Marland Kitchen warfarin (COUMADIN) 5 MG tablet TAKE AS DIRECTED BY ANTICOAGULATION 30 tablet 3   No current facility-administered medications for this encounter.    Allergies: Nsaids   Social History: The patient  reports that he has never smoked. He has never used smokeless tobacco. He reports that he does not drink alcohol or use illicit drugs.   Family History: The patient's family history includes Coronary artery disease in his father.    ROS: Please see the history of present illness. All other systems are reviewed and negative.    PHYSICAL EXAM: VS: BP 142/90 mmHg  Pulse 81  Ht 6\' 4"  (1.93 m)  Wt 256 lb (116.121 kg)  BMI 31.17 kg/m2 , BMI Body mass index is 31.17 kg/(m^2). GEN: Well nourished, well developed, in no acute distress  HEENT: normal  Neck: no JVD  Cardiac: iRRR; no murmurs, rubs, or gallops,no edema  Respiratory: clear to auscultation bilaterally, normal work of breathing GI: soft, nontender, nondistended, + BS MS: no deformity or atrophy  Skin: warm and dry  Neuro: Strength and sensation are  intact Psych: euthymic mood, full affect  Recent Labs: 08/04/2015: BUN 20; Creatinine, Ser 1.47*; Magnesium 1.7; Potassium 4.3; Sodium 140 09/09/2015: ALT 15; TSH 1.03 10/07/2015: Hemoglobin 15.2; Platelets 182    Lipid Panel   Labs (Brief)    No results found for: CHOL, TRIG, HDL, CHOLHDL, VLDL, LDLCALC, LDLDIRECT     Wt Readings from Last 3 Encounters:  10/07/15 256 lb (116.121 kg)  09/09/15 261 lb (118.389 kg)  08/24/15 261 lb 12.8 oz (118.752 kg)      INR's 08/11/15- 2.5, 09/09/15- 2.6, 3/29- 2.6, 4/13-4.7 and will have INR drawn at [address] am of DCCV  ASSESSMENT AND PLAN:  1. Persistent afib The patient has symptomatic persistent atrial fibrillation. He is now loaded on amiodarone x one month. DCCV has been set up 3/20, per Dr.Allred's plan..  Discussed  with Jinny Blossom, PharmD and she states if therapeutic am of procedure his INR's are sufficient to undergo DCCV without cardioversion. Decrease amiodarone to 200 mg daily after cardioversion. Cbc/bmet today  2. CAD No ischemic symptoms Medically managed Will stop ASA given risks of bleeding with [place] therapy  3. HTN Stable No change required today  Follow-up with AF clinic one week s/p cardioversion  Butch Penny C. Mila [facility] [address] [place], [place] [address] [place]; INRs have been therapeutic. Kirk Ruths

## 2015-10-14 NOTE — Transfer of Care (Signed)
Immediate Anesthesia Transfer of Care Note  Patient: [name]  Procedure(s) Performed: Procedure(s): CARDIOVERSION (N/A)  Patient Location: Endoscopy Unit  Anesthesia Type:General  Level of Consciousness: sedated and patient cooperative  Airway & Oxygen Therapy: Patient Spontanous Breathing and Patient connected to nasal cannula oxygen  Post-op Assessment: Report given to RN and Post -op Vital signs reviewed and stable  Post vital signs: Reviewed and stable  Last Vitals:  Filed Vitals:   10/14/15 1320 10/14/15 1321  BP: 186/66   Pulse: 76 62  Temp:    Resp: 16 23    Complications: No apparent anesthesia complications

## 2015-10-14 NOTE — Procedures (Signed)
Electrical Cardioversion Procedure Note LESTON HOLTHUS [id number] Apr 28, 1951  Procedure: Electrical Cardioversion Indications:  Atrial Flutter  Procedure Details Consent: Risks of procedure as well as the alternatives and risks of each were explained to the (patient/caregiver).  Consent for procedure obtained. Time Out: Verified patient identification, verified procedure, site/side was marked, verified correct patient position, special equipment/implants available, medications/allergies/relevent history reviewed, required imaging and test results available.  Performed  Patient placed on cardiac monitor, pulse oximetry, supplemental oxygen as necessary.  Sedation given: Patient sedated by anesthesia with lidocaine 40 mg and diprovan 50 mg IV. Pacer pads placed anterior and posterior chest.  Cardioverted 1 time(s).  Cardioverted at 120J.  Evaluation Findings: Post procedure EKG shows: NSR Complications: None Patient did tolerate procedure well.   Kirk Ruths 10/14/2015, 1:22 PM

## 2015-10-14 NOTE — Anesthesia Preprocedure Evaluation (Addendum)
Anesthesia Evaluation  Patient identified by MRN, date of birth, ID band Patient awake    Reviewed: Allergy & Precautions, H&P , NPO status , Patient's Chart, lab work & pertinent test results  Airway Mallampati: II  TM Distance: >3 FB Neck ROM: full    Dental  (+) Dental Advisory Given, Edentulous Upper, Edentulous Lower   Pulmonary sleep apnea ,  Tonsillar cancer   Pulmonary exam normal breath sounds clear to auscultation       Cardiovascular hypertension, Pt. on medications Normal cardiovascular exam+ dysrhythmias Atrial Fibrillation  Rhythm:regular Rate:Normal  ECHO 07/2015 EF 55%, normal wall motion, valves ok   Neuro/Psych negative neurological ROS  negative psych ROS   GI/Hepatic negative GI ROS, Neg liver ROS, GERD  Medicated and Controlled,  Endo/Other  diabetes, Well Controlled, Type 2, Oral Hypoglycemic AgentsHypothyroidism   Renal/GU Renal InsufficiencyRenal diseasenegative Renal ROSCreat 1.55  negative genitourinary   Musculoskeletal   Abdominal   Peds  Hematology negative hematology ROS (+) 15/45   Anesthesia Other Findings   Reproductive/Obstetrics negative OB ROS                            Anesthesia Physical Anesthesia Plan  ASA: III  Anesthesia Plan: MAC   Post-op Pain Management:    Induction: Intravenous  Airway Management Planned: Nasal Cannula and Simple Face Mask  Additional Equipment:   Intra-op Plan:   Post-operative Plan:   Informed Consent: I have reviewed the patients History and Physical, chart, labs and discussed the procedure including the risks, benefits and alternatives for the proposed anesthesia with the patient or authorized representative who has indicated his/her understanding and acceptance.     Plan Discussed with:   Anesthesia Plan Comments:         Anesthesia Quick Evaluation

## 2015-10-14 NOTE — Anesthesia Procedure Notes (Signed)
Procedure Name: MAC Date/Time: 10/14/2015 1:04 PM Performed by: Layla Maw Pre-anesthesia Checklist: Patient identified, Patient being monitored, Timeout performed, Emergency Drugs available and Suction available Patient Re-evaluated:Patient Re-evaluated prior to inductionOxygen Delivery Method: Nasal cannula and Ambu bag Preoxygenation: Pre-oxygenation with 100% oxygen Number of attempts: 1 Placement Confirmation: positive ETCO2 Dental Injury: Teeth and Oropharynx as per pre-operative assessment

## 2015-10-15 ENCOUNTER — Encounter (HOSPITAL_COMMUNITY): Payer: Self-pay | Admitting: Cardiology

## 2015-10-18 DIAGNOSIS — G4733 Obstructive sleep apnea (adult) (pediatric): Secondary | ICD-10-CM | POA: Diagnosis not present

## 2015-10-21 ENCOUNTER — Ambulatory Visit (HOSPITAL_COMMUNITY)
Admission: RE | Admit: 2015-10-21 | Discharge: 2015-10-21 | Disposition: A | Discharge status: Home / Self Care | Payer: Medicare Other | Source: Ambulatory Visit | Attending: Nurse Practitioner | Admitting: Nurse Practitioner

## 2015-10-21 ENCOUNTER — Ambulatory Visit (INDEPENDENT_AMBULATORY_CARE_PROVIDER_SITE_OTHER): Payer: Medicare Other | Admitting: *Deleted

## 2015-10-21 ENCOUNTER — Encounter (HOSPITAL_COMMUNITY): Payer: Self-pay | Admitting: Nurse Practitioner

## 2015-10-21 VITALS — BP 118/72 | HR 81 | Ht 76.0 in | Wt 252.4 lb

## 2015-10-21 DIAGNOSIS — Z85819 Personal history of malignant neoplasm of unspecified site of lip, oral cavity, and pharynx: Secondary | ICD-10-CM | POA: Insufficient documentation

## 2015-10-21 DIAGNOSIS — I480 Paroxysmal atrial fibrillation: Secondary | ICD-10-CM

## 2015-10-21 DIAGNOSIS — K219 Gastro-esophageal reflux disease without esophagitis: Secondary | ICD-10-CM | POA: Diagnosis not present

## 2015-10-21 DIAGNOSIS — R001 Bradycardia, unspecified: Secondary | ICD-10-CM | POA: Diagnosis not present

## 2015-10-21 DIAGNOSIS — I2510 Atherosclerotic heart disease of native coronary artery without angina pectoris: Secondary | ICD-10-CM | POA: Insufficient documentation

## 2015-10-21 DIAGNOSIS — Z0189 Encounter for other specified special examinations: Secondary | ICD-10-CM | POA: Insufficient documentation

## 2015-10-21 DIAGNOSIS — Z7984 Long term (current) use of oral hypoglycemic drugs: Secondary | ICD-10-CM | POA: Insufficient documentation

## 2015-10-21 DIAGNOSIS — Z79899 Other long term (current) drug therapy: Secondary | ICD-10-CM | POA: Insufficient documentation

## 2015-10-21 DIAGNOSIS — I4819 Other persistent atrial fibrillation: Secondary | ICD-10-CM

## 2015-10-21 DIAGNOSIS — I10 Essential (primary) hypertension: Secondary | ICD-10-CM | POA: Diagnosis not present

## 2015-10-21 DIAGNOSIS — Z5181 Encounter for therapeutic drug level monitoring: Secondary | ICD-10-CM | POA: Diagnosis not present

## 2015-10-21 DIAGNOSIS — I517 Cardiomegaly: Secondary | ICD-10-CM | POA: Diagnosis not present

## 2015-10-21 DIAGNOSIS — Z9221 Personal history of antineoplastic chemotherapy: Secondary | ICD-10-CM | POA: Diagnosis not present

## 2015-10-21 DIAGNOSIS — I481 Persistent atrial fibrillation: Secondary | ICD-10-CM | POA: Diagnosis not present

## 2015-10-21 DIAGNOSIS — E119 Type 2 diabetes mellitus without complications: Secondary | ICD-10-CM | POA: Diagnosis not present

## 2015-10-21 DIAGNOSIS — G4730 Sleep apnea, unspecified: Secondary | ICD-10-CM | POA: Diagnosis not present

## 2015-10-21 DIAGNOSIS — Z7901 Long term (current) use of anticoagulants: Secondary | ICD-10-CM | POA: Insufficient documentation

## 2015-10-21 DIAGNOSIS — I4891 Unspecified atrial fibrillation: Secondary | ICD-10-CM

## 2015-10-21 LAB — POCT INR: INR: 3.5

## 2015-10-21 NOTE — Progress Notes (Signed)
Patient ID: [name], male   DOB: Nov 22, 1950, 65 y.o.   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 male with a h/o afib that was first diagnosed 14 years ago. 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 Over the years afib has become persistent. He recently wore a 48 hour monitor that showed persistent afib with minimum heart rate of 59 bpm, mean heart beat of 90 bpm, max of 116. He does have periods of slow heart beat with pauses but these are mostly associated with sleeping, asymptomatic, and he is pending a f/u sleep study soon. Some days he does admit to more fatigue and is generally associated with faster heart beat. He currently is on warfarin for chadsvasc score of at least 2(htn, DM), metorpolol for rate control. Has not used AAD's in the past.  Does not use alcohol, or tobacco products, denies snoring issues, feels rested on waking up. Moderate caffeine use. Wife has been in a nursing facility for years with dementia  and he visits her daily. No regular exercise program. He complained on last visit of some weeping of fluid from lower extremities for days now, which has occurred as well  in the past. He started wearing support knee high socks and this resolved.  Echo performed 07/28/15 revealed normal systolic function, with moderate concentric hypertrophy, normal wall motion, with  Left atrium 50 mm. He continues to be symptomatic with exertional dyspnea, fatigue associated with afib.   He was seen by Dr. [name] and was started on amiodarone 200 mg bid with plans for cardioversion in one month. He was set up for cardioversion 4/20 and did convert but thinks by the weekend he returned to afib, he has not felt as well over the last few days with more fatigue.  Today, he denies symptoms of palpitations, chest pain, shortness of breath, orthopnea, PND, lower  extremity edema, dizziness, presyncope, syncope, or neurologic sequela. Positive  The patient is tolerating medications without difficulties and is otherwise without complaint today.   Past Medical History  Diagnosis Date  . Hypertension   . Diabetes mellitus   . Hypotension   . GERD (gastroesophageal reflux disease)   . Sleep apnea   . 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 CAD (coronary artery disease)     70% LCx stenosis, treated medically  . Persistent atrial fibrillation ([place])   . Left atrial enlargement    Past Surgical History  Procedure Laterality Date  . Breast surgery    . Cardiac catheterization    . Colonoscopy with propofol N/A 06/03/2015    Procedure: COLONOSCOPY WITH PROPOFOL;  Surgeon: Laurence Spates, MD;  Location: [facility];  Service: Endoscopy;  Laterality: N/A;  . Hot hemostasis N/A 06/03/2015    Procedure: HOT HEMOSTASIS (ARGON PLASMA COAGULATION/BICAP);  Surgeon: Laurence Spates, MD;  Location: [facility];  Service: Endoscopy;  Laterality: N/A;  . Cardioversion N/A 10/14/2015    Procedure: CARDIOVERSION;  Surgeon: Lelon Perla, MD;  Location: [facility];  Service: Cardiovascular;  Laterality: N/A;    Current Outpatient Prescriptions  Medication Sig Dispense Refill  . amiodarone (PACERONE) 200 MG tablet Take 200 mg by mouth 2 (two) times daily.    Marland Kitchen docusate sodium (COLACE) 100 MG capsule Take 100 mg by mouth 2 (two) times daily as needed. For stool softener.    Marland Kitchen  glimepiride (AMARYL) 1 MG tablet Take 1 mg by mouth daily with breakfast.    . hydrochlorothiazide (HYDRODIURIL) 25 MG tablet Take 25 mg by mouth daily.    Marland Kitchen levothyroxine (SYNTHROID, LEVOTHROID) 112 MCG tablet Take 112 mcg by mouth daily.    Marland Kitchen liothyronine (CYTOMEL) 5 MCG tablet Take 5 mcg by mouth daily.    . metFORMIN (GLUCOPHAGE) 1000 MG tablet Take 1,000 mg by mouth 2 (two) times daily with a meal.    . metoprolol succinate (TOPROL-XL) 50 MG 24 hr tablet Take 1 1/2 tablets  by mouth daily    . Multiple Vitamin (MULITIVITAMIN WITH MINERALS) TABS Take 1 tablet by mouth daily.    . Omega-3 Fatty Acids (FISH OIL PO) Take 2 capsules by mouth daily.    . pravastatin (PRAVACHOL) 80 MG tablet Take 80 mg by mouth at bedtime.     . quinapril (ACCUPRIL) 40 MG tablet Take 20-40 mg by mouth 2 (two) times daily. Take 1 tablet in AM, and 1/2 tablet in evening    . ranitidine (ZANTAC) 150 MG tablet Take 150 mg by mouth daily.    Marland Kitchen warfarin (COUMADIN) 5 MG tablet TAKE AS DIRECTED BY  ANTICOAGULATION 30 tablet 3   No current facility-administered medications for this encounter.    Allergies  Allergen Reactions  . Nsaids Other (See Comments)    REACTION: PER MD REQUEST NOT TO TAKE    Social History   Social History  . Marital Status: Married    Spouse Name: N/A  . Number of Children: N/A  . Years of Education: N/A   Occupational History  . Not on file.   Social History Main Topics  . Smoking status: Never Smoker   . Smokeless tobacco: Never Used  . Alcohol Use: No  . Drug Use: No  . Sexual Activity: Not on file   Other Topics Concern  . Not on file   Social History Narrative    Family History  Problem Relation Age of Onset  . Coronary artery disease Father     ROS- All systems are reviewed and negative except as per the HPI above  Physical Exam: Filed Vitals:   10/21/15 1007  BP: 118/72  Pulse: 81  Height: 6\' 4"  (1.93 m)  Weight: 252 lb 6.4 oz (114.488 kg)    GEN- The patient is well appearing, alert and oriented x 3 today.   Head- normocephalic, atraumatic Eyes-  Sclera clear, conjunctiva pink Ears- hearing intact Oropharynx- clear Neck- supple, no JVP Lymph- no cervical lymphadenopathy Lungs- Clear to ausculation bilaterally, normal work of breathing Heart- Irregular rate and rhythm, no murmurs, rubs or gallops, PMI not laterally displaced GI- soft, NT, ND, + BS Extremities- no clubbing, cyanosis, trace edema MS- no significant  deformity or atrophy Skin- no rash or lesion Psych- euthymic mood, full affect Neuro- strength and sensation are intact  EKG- afib with v rate 81 bpm, LAD, qrs int 82 ms, qtc 448 ms Epic records reviewed  Assessment and Plan:  1. Symptomatic persistent Afib Failed amiodarone and DCCV 4/20 Appears to be persistent by recent monitor, rate fairly well controlled but does have a tendency toward bradycardia,  may be associated with snoring. Most of the bradycardia/pauses ( less than 3 seconds) appear at night. Sleep study pending He states before returning to afib, he saw heart rates in the 40's Continue metoprolol Increase amiodarone 200 mg back to bid, reduced to one daily last Friday after cardioversion Continue warfarin  2.  LEE Stable  3. HTN  Stable  F/u with Dr. [name] with recent failure of amiodarone and cardioversion for other options to restore sinus rhythm  Avneet Ashmore C. Aaryanna Hyden, [facility] [address] [place], [place] [address] [phone]

## 2015-10-21 NOTE — Patient Instructions (Signed)
Your physician has recommended you make the following change in your medication:  1)Increase amiodarone 200mg  twice a day  Follow up with Dr. [name]. Scheduler will be in touch

## 2015-10-22 DIAGNOSIS — G4737 Central sleep apnea in conditions classified elsewhere: Secondary | ICD-10-CM | POA: Diagnosis not present

## 2015-10-22 DIAGNOSIS — G4733 Obstructive sleep apnea (adult) (pediatric): Secondary | ICD-10-CM | POA: Diagnosis not present

## 2015-10-25 DIAGNOSIS — H2513 Age-related nuclear cataract, bilateral: Secondary | ICD-10-CM | POA: Diagnosis not present

## 2015-10-25 DIAGNOSIS — E113393 Type 2 diabetes mellitus with moderate nonproliferative diabetic retinopathy without macular edema, bilateral: Secondary | ICD-10-CM | POA: Diagnosis not present

## 2015-11-01 ENCOUNTER — Ambulatory Visit (INDEPENDENT_AMBULATORY_CARE_PROVIDER_SITE_OTHER): Payer: Medicare Other | Admitting: Pharmacist

## 2015-11-01 DIAGNOSIS — I480 Paroxysmal atrial fibrillation: Secondary | ICD-10-CM | POA: Diagnosis not present

## 2015-11-01 DIAGNOSIS — I4891 Unspecified atrial fibrillation: Secondary | ICD-10-CM

## 2015-11-01 DIAGNOSIS — Z5181 Encounter for therapeutic drug level monitoring: Secondary | ICD-10-CM

## 2015-11-01 LAB — POCT INR: INR: 3.9

## 2015-11-07 ENCOUNTER — Other Ambulatory Visit: Payer: Self-pay | Admitting: Interventional Cardiology

## 2015-11-17 ENCOUNTER — Ambulatory Visit (INDEPENDENT_AMBULATORY_CARE_PROVIDER_SITE_OTHER): Payer: Medicare Other | Admitting: Internal Medicine

## 2015-11-17 ENCOUNTER — Encounter: Payer: Self-pay | Admitting: Internal Medicine

## 2015-11-17 ENCOUNTER — Ambulatory Visit (INDEPENDENT_AMBULATORY_CARE_PROVIDER_SITE_OTHER): Payer: Medicare Other | Admitting: *Deleted

## 2015-11-17 VITALS — BP 170/82 | HR 46 | Ht 76.0 in | Wt 254.1 lb

## 2015-11-17 DIAGNOSIS — I481 Persistent atrial fibrillation: Secondary | ICD-10-CM

## 2015-11-17 DIAGNOSIS — I4891 Unspecified atrial fibrillation: Secondary | ICD-10-CM | POA: Diagnosis not present

## 2015-11-17 DIAGNOSIS — I4819 Other persistent atrial fibrillation: Secondary | ICD-10-CM

## 2015-11-17 DIAGNOSIS — I10 Essential (primary) hypertension: Secondary | ICD-10-CM

## 2015-11-17 DIAGNOSIS — I480 Paroxysmal atrial fibrillation: Secondary | ICD-10-CM

## 2015-11-17 DIAGNOSIS — Z5181 Encounter for therapeutic drug level monitoring: Secondary | ICD-10-CM

## 2015-11-17 LAB — POCT INR: INR: 5

## 2015-11-17 LAB — TSH: TSH: 0.82 mIU/L (ref 0.40–4.50)

## 2015-11-17 MED ORDER — SPIRONOLACTONE 25 MG PO TABS: 25.0000 mg | ORAL_TABLET | Freq: Every day | ORAL | Status: DC

## 2015-11-17 MED ORDER — METOPROLOL SUCCINATE ER 50 MG PO TB24: 50.0000 mg | ORAL_TABLET | Freq: Every day | ORAL | Status: DC

## 2015-11-17 NOTE — Patient Instructions (Addendum)
Medication Instructions:  Your physician has recommended you make the following change in your medication:  1) Decrease Amiodarone to 200 mg daily 2) Start Spironolactone 25 mg daily 3) Decrease Toprol to 50 mg daily---in 7 days after decreasing medication if your heart rate is less than 60 decrease Metoprolol to 25 mg daily and call me and let me know you have done so   Labwork: Your physician recommends that you return for lab work today: BMP/Liver/TSH and another BMP in 2 weeks   Testing/Procedures: None ordered   Follow-Up: Your physician recommends that you schedule a follow-up appointment in: 3 months with Dr [name]    Any Other Special Instructions Will Be Listed Below (If Applicable).     If you need a refill on your cardiac medications before your next appointment, please call your pharmacy.

## 2015-11-17 NOTE — Progress Notes (Signed)
Electrophysiology Office Note   Date:  11/17/2015   ID:  [name], DOB 02-03-1951, MRN [id number]  PCP:  Gennette Pac, MD  Cardiologist:  Dr [name] Primary Electrophysiologist: Thompson Grayer, MD    Chief Complaint  Patient presents with  . Atrial Fibrillation     History of Present Illness: [name] is a 65 y.o. male who presents today for electrophysiology evaluation.   The patient has persistent atrial fibrillation.  He is now back in sinus with amiodarone.  He thinks that he has been in sinus for about 3 weeks (due to reduction in heart rate from 90s to 40s/50s).  He does not feel any better with sinus rhythm.  He continues to be tired throughout the day and reports having dyspnea walking from the parking lot to the office even in sinus.  His CPAP was recently switched to BiPAP.  Today, he denies symptoms of palpitations, chest pain, orthopnea, PND, lower extremity edema, claudication, dizziness, presyncope, syncope, bleeding, or neurologic sequela. The patient is tolerating medications without difficulties and is otherwise without complaint today.    Past Medical History  Diagnosis Date  . Hypertension   . Diabetes mellitus   . Hypotension   . GERD (gastroesophageal reflux disease)   . Sleep apnea   . 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 CAD (coronary artery disease)     70% LCx stenosis, treated medically  . Persistent atrial fibrillation ([place])   . Left atrial enlargement    Past Surgical History  Procedure Laterality Date  . Breast surgery    . Cardiac catheterization    . Colonoscopy with propofol N/A 06/03/2015    Procedure: COLONOSCOPY WITH PROPOFOL;  Surgeon: Laurence Spates, MD;  Location: [facility] ENDOSCOPY;  Service: Endoscopy;  Laterality: N/A;  . Hot hemostasis N/A 06/03/2015    Procedure: HOT HEMOSTASIS (ARGON PLASMA COAGULATION/BICAP);  Surgeon: Laurence Spates, MD;  Location: [facility] ENDOSCOPY;  Service: Endoscopy;  Laterality: N/A;  .  Cardioversion N/A 10/14/2015    Procedure: CARDIOVERSION;  Surgeon: Lelon Perla, MD;  Location: [facility] ENDOSCOPY;  Service: Cardiovascular;  Laterality: N/A;     Current Outpatient Prescriptions  Medication Sig Dispense Refill  . amiodarone (PACERONE) 200 MG tablet Take 200 mg by mouth 2 (two) times daily.    Marland Kitchen docusate sodium (COLACE) 100 MG capsule Take 100 mg by mouth 2 (two) times daily as needed. For stool softener.    Marland Kitchen glimepiride (AMARYL) 1 MG tablet Take 1 mg by mouth daily with breakfast.    . hydrochlorothiazide (HYDRODIURIL) 25 MG tablet Take 25 mg by mouth daily.    Marland Kitchen levothyroxine (SYNTHROID, LEVOTHROID) 112 MCG tablet Take 112 mcg by mouth daily.    Marland Kitchen liothyronine (CYTOMEL) 5 MCG tablet Take 5 mcg by mouth daily.    . metFORMIN (GLUCOPHAGE) 1000 MG tablet Take 1,000 mg by mouth 2 (two) times daily with a meal.    . metoprolol succinate (TOPROL-XL) 50 MG 24 hr tablet Take 1 1/2 tablets by mouth daily    . Multiple Vitamin (MULITIVITAMIN WITH MINERALS) TABS Take 1 tablet by mouth daily.    . Omega-3 Fatty Acids (FISH OIL PO) Take 2 capsules by mouth daily.    . pravastatin (PRAVACHOL) 80 MG tablet Take 80 mg by mouth at bedtime.     . quinapril (ACCUPRIL) 40 MG tablet Take 20-40 mg by mouth 2 (two) times daily. Take 1 tablet in AM, and 1/2 tablet in  evening    . ranitidine (ZANTAC) 150 MG tablet Take 150 mg by mouth daily.    Marland Kitchen warfarin (COUMADIN) 5 MG tablet TAKE AS DIRECTED BY  ANTICOAGULATION 30 tablet 3   No current facility-administered medications for this visit.    Allergies:   Nsaids   Social History:  The patient  reports that he has never smoked. He has never used smokeless tobacco. He reports that he does not drink alcohol or use illicit drugs.   Family History:  The patient's  family history includes Coronary artery disease in his father.    ROS:  Please see the history of present illness.   All other systems are reviewed and negative.    PHYSICAL  EXAM: VS:  BP 170/82 mmHg  Pulse 46  Ht 6\' 4"  (1.93 m)  Wt 254 lb 1.9 oz (115.268 kg)  BMI 30.95 kg/m2 , BMI Body mass index is 30.95 kg/(m^2). GEN: Well nourished, well developed, in no acute distress HEENT: normal Neck: no JVD  Cardiac: bradycardic regular rhythm; no murmurs, rubs, or gallops,no edema  Respiratory:  clear to auscultation bilaterally, normal work of breathing GI: soft, nontender, nondistended, + BS MS: no deformity or atrophy Skin: warm and dry  Neuro:  Strength and sensation are intact Psych: euthymic mood, full affect  Recent Labs: 08/04/2015: Magnesium 1.7 09/09/2015: ALT 15; TSH 1.03 10/07/2015: BUN 21*; Creatinine, Ser 1.55*; Hemoglobin 15.2; Platelets 182; Potassium 4.1; Sodium 139    Lipid Panel  No results found for: CHOL, TRIG, HDL, CHOLHDL, VLDL, LDLCALC, LDLDIRECT   Wt Readings from Last 3 Encounters:  11/17/15 254 lb 1.9 oz (115.268 kg)  10/21/15 252 lb 6.4 oz (114.488 kg)  10/07/15 256 lb (116.121 kg)      Other studies Reviewed: Additional studies/ records that were reviewed today include: AF clinic notes,  echo, prior ecgs   ASSESSMENT AND PLAN:  1.  Persistent afib Now back in sinus but without any clinical benefit LA size is 50 mm.  I thnk that our ability to maintain sinus long term is quite low. Ultimately, he may be served best with rate control alone. Given no clinical improvement in symptoms with sinus and LA size, I would not advise ablation for him LFTs, TFTs today. Continue long term anticoagulation Reduce amiodarone to 200mg  daily  2. CAD No ischemic symptoms Medically managed  3. HTN Elevated Reduce toprol due to fatigue Add spironolactone 25mg  daily bmet today Repeat bmet in 2 weeks  Follow-up with Dr [name].  I will see as needed going forward  Signed, Thompson Grayer, MD  11/17/2015 2:19 PM     [facility] [address] [place] [address] [phone] (office) [phone]  (fax)

## 2015-11-18 ENCOUNTER — Encounter: Payer: Self-pay | Admitting: Internal Medicine

## 2015-11-18 LAB — BASIC METABOLIC PANEL
BUN: 18 mg/dL (ref 7–25)
CALCIUM: 9.3 mg/dL (ref 8.6–10.3)
CO2: 26 mmol/L (ref 20–31)
CREATININE: 1.6 mg/dL — AB (ref 0.70–1.25)
Chloride: 101 mmol/L (ref 98–110)
GLUCOSE: 63 mg/dL — AB (ref 65–99)
Potassium: 4.2 mmol/L (ref 3.5–5.3)
Sodium: 138 mmol/L (ref 135–146)

## 2015-11-18 LAB — HEPATIC FUNCTION PANEL
ALBUMIN: 4.2 g/dL (ref 3.6–5.1)
ALK PHOS: 51 U/L (ref 40–115)
ALT: 28 U/L (ref 9–46)
AST: 26 U/L (ref 10–35)
Bilirubin, Direct: 0.1 mg/dL (ref <=0.2)
Indirect Bilirubin: 0.3 mg/dL (ref 0.2–1.2)
TOTAL PROTEIN: 6.8 g/dL (ref 6.1–8.1)
Total Bilirubin: 0.4 mg/dL (ref 0.2–1.2)

## 2015-11-24 ENCOUNTER — Ambulatory Visit (INDEPENDENT_AMBULATORY_CARE_PROVIDER_SITE_OTHER): Payer: Medicare Other | Admitting: *Deleted

## 2015-11-24 ENCOUNTER — Encounter: Payer: Self-pay | Admitting: Internal Medicine

## 2015-11-24 DIAGNOSIS — I480 Paroxysmal atrial fibrillation: Secondary | ICD-10-CM | POA: Diagnosis not present

## 2015-11-24 DIAGNOSIS — Z5181 Encounter for therapeutic drug level monitoring: Secondary | ICD-10-CM | POA: Diagnosis not present

## 2015-11-24 DIAGNOSIS — I4891 Unspecified atrial fibrillation: Secondary | ICD-10-CM

## 2015-11-24 LAB — POCT INR: INR: 2.1

## 2015-12-01 ENCOUNTER — Other Ambulatory Visit (INDEPENDENT_AMBULATORY_CARE_PROVIDER_SITE_OTHER): Payer: Medicare Other | Admitting: *Deleted

## 2015-12-01 DIAGNOSIS — I10 Essential (primary) hypertension: Secondary | ICD-10-CM

## 2015-12-01 LAB — BASIC METABOLIC PANEL
BUN: 20 mg/dL (ref 7–25)
CHLORIDE: 106 mmol/L (ref 98–110)
CO2: 21 mmol/L (ref 20–31)
Calcium: 8.9 mg/dL (ref 8.6–10.3)
Creat: 1.57 mg/dL — ABNORMAL HIGH (ref 0.70–1.25)
Glucose, Bld: 106 mg/dL — ABNORMAL HIGH (ref 65–99)
POTASSIUM: 5.2 mmol/L (ref 3.5–5.3)
SODIUM: 139 mmol/L (ref 135–146)

## 2015-12-01 NOTE — Addendum Note (Signed)
Addended by: Eulis Foster on: 12/01/2015 07:37 AM   Modules accepted: Orders

## 2015-12-08 ENCOUNTER — Ambulatory Visit (INDEPENDENT_AMBULATORY_CARE_PROVIDER_SITE_OTHER): Payer: Medicare Other | Admitting: Pharmacist

## 2015-12-08 DIAGNOSIS — I480 Paroxysmal atrial fibrillation: Secondary | ICD-10-CM | POA: Diagnosis not present

## 2015-12-08 DIAGNOSIS — I4891 Unspecified atrial fibrillation: Secondary | ICD-10-CM | POA: Diagnosis not present

## 2015-12-08 DIAGNOSIS — Z5181 Encounter for therapeutic drug level monitoring: Secondary | ICD-10-CM

## 2015-12-08 LAB — POCT INR: INR: 2.3

## 2015-12-14 ENCOUNTER — Encounter: Payer: Self-pay | Admitting: Internal Medicine

## 2015-12-17 ENCOUNTER — Encounter: Payer: Self-pay | Admitting: Interventional Cardiology

## 2015-12-22 DIAGNOSIS — E118 Type 2 diabetes mellitus with unspecified complications: Secondary | ICD-10-CM | POA: Diagnosis not present

## 2015-12-22 DIAGNOSIS — Z7984 Long term (current) use of oral hypoglycemic drugs: Secondary | ICD-10-CM | POA: Diagnosis not present

## 2016-01-05 DIAGNOSIS — G4731 Primary central sleep apnea: Secondary | ICD-10-CM | POA: Diagnosis not present

## 2016-01-05 DIAGNOSIS — G4733 Obstructive sleep apnea (adult) (pediatric): Secondary | ICD-10-CM | POA: Diagnosis not present

## 2016-01-06 DIAGNOSIS — Z7984 Long term (current) use of oral hypoglycemic drugs: Secondary | ICD-10-CM | POA: Diagnosis not present

## 2016-01-06 DIAGNOSIS — E118 Type 2 diabetes mellitus with unspecified complications: Secondary | ICD-10-CM | POA: Diagnosis not present

## 2016-01-07 ENCOUNTER — Ambulatory Visit (INDEPENDENT_AMBULATORY_CARE_PROVIDER_SITE_OTHER): Payer: Medicare Other | Admitting: *Deleted

## 2016-01-07 DIAGNOSIS — Z5181 Encounter for therapeutic drug level monitoring: Secondary | ICD-10-CM | POA: Diagnosis not present

## 2016-01-07 DIAGNOSIS — I4891 Unspecified atrial fibrillation: Secondary | ICD-10-CM | POA: Diagnosis not present

## 2016-01-07 DIAGNOSIS — I480 Paroxysmal atrial fibrillation: Secondary | ICD-10-CM | POA: Diagnosis not present

## 2016-01-07 LAB — POCT INR: INR: 2.1

## 2016-01-31 ENCOUNTER — Encounter: Payer: Self-pay | Admitting: Interventional Cardiology

## 2016-02-04 ENCOUNTER — Ambulatory Visit (INDEPENDENT_AMBULATORY_CARE_PROVIDER_SITE_OTHER): Payer: Medicare Other | Admitting: Pharmacist

## 2016-02-04 DIAGNOSIS — I4891 Unspecified atrial fibrillation: Secondary | ICD-10-CM | POA: Diagnosis not present

## 2016-02-04 DIAGNOSIS — Z5181 Encounter for therapeutic drug level monitoring: Secondary | ICD-10-CM | POA: Diagnosis not present

## 2016-02-04 DIAGNOSIS — I480 Paroxysmal atrial fibrillation: Secondary | ICD-10-CM

## 2016-02-04 LAB — POCT INR: INR: 3.7

## 2016-02-15 ENCOUNTER — Encounter: Payer: Self-pay | Admitting: Interventional Cardiology

## 2016-02-15 DIAGNOSIS — C069 Malignant neoplasm of mouth, unspecified: Secondary | ICD-10-CM | POA: Diagnosis not present

## 2016-02-15 DIAGNOSIS — E782 Mixed hyperlipidemia: Secondary | ICD-10-CM | POA: Diagnosis not present

## 2016-02-15 DIAGNOSIS — E039 Hypothyroidism, unspecified: Secondary | ICD-10-CM | POA: Diagnosis not present

## 2016-02-15 DIAGNOSIS — R197 Diarrhea, unspecified: Secondary | ICD-10-CM | POA: Diagnosis not present

## 2016-02-16 DIAGNOSIS — R197 Diarrhea, unspecified: Secondary | ICD-10-CM | POA: Diagnosis not present

## 2016-02-22 ENCOUNTER — Encounter: Payer: Self-pay | Admitting: Interventional Cardiology

## 2016-02-22 ENCOUNTER — Ambulatory Visit (INDEPENDENT_AMBULATORY_CARE_PROVIDER_SITE_OTHER): Payer: Medicare Other | Admitting: Interventional Cardiology

## 2016-02-22 VITALS — BP 130/68 | HR 49 | Ht 76.0 in | Wt 240.2 lb

## 2016-02-22 DIAGNOSIS — I10 Essential (primary) hypertension: Secondary | ICD-10-CM

## 2016-02-22 DIAGNOSIS — G4733 Obstructive sleep apnea (adult) (pediatric): Secondary | ICD-10-CM

## 2016-02-22 DIAGNOSIS — E785 Hyperlipidemia, unspecified: Secondary | ICD-10-CM | POA: Diagnosis not present

## 2016-02-22 DIAGNOSIS — Z5181 Encounter for therapeutic drug level monitoring: Secondary | ICD-10-CM

## 2016-02-22 DIAGNOSIS — I480 Paroxysmal atrial fibrillation: Secondary | ICD-10-CM | POA: Diagnosis not present

## 2016-02-22 MED ORDER — AMIODARONE HCL 200 MG PO TABS: ORAL_TABLET | ORAL | 1 refills | Status: DC

## 2016-02-22 NOTE — Patient Instructions (Signed)
Medication Instructions:  Decrease amiodarone to 100mg  daily-this will be 1/2 of a 200mg  tablet daily  Labwork: None   Testing/Procedures: none  Follow-Up: Your physician recommends that you schedule a follow-up appointment in: 4 months with Dr [name].       If you need a refill on your cardiac medications before your next appointment, please call your pharmacy.

## 2016-02-22 NOTE — Progress Notes (Signed)
Cardiology Office Note    Date:  02/22/2016   ID:  [name], DOB 25-Feb-1951, MRN [id number]  PCP:  Frank Pac, MD  Cardiologist: Frank Grooms, MD   Chief Complaint  Patient presents with  . Atrial Fibrillation    History of Present Illness:  [name] is a 65 y.o. male whopresents for paroxysmal A. fib, hypertension, diabetes, and chronic anticoagulation therapy   No recurrence of atrial fibrillation. Feels better in sinus rhythm but still does not feel he has his full complement of energy. He wonders if a side effect of amiodarone could be hallucinations. Laboratory data by Dr. Rex Gordon demonstrates normal liver function. TSH was suppressed. Levoxyl dose was decreased.  Past Medical History:  Diagnosis Date  . CAD (coronary artery disease)    70% LCx stenosis, treated medically  . Diabetes mellitus   . GERD (gastroesophageal reflux disease)   . Hypertension   . Hypotension   . Left atrial enlargement   . Persistent atrial fibrillation ([name])   . Sleep apnea   . tonsillar ca dx'd 02/2006   xrt/chemo comp 05/2006    Past Surgical History:  Procedure Laterality Date  . BREAST SURGERY    . CARDIAC CATHETERIZATION    . CARDIOVERSION N/A 10/14/2015   Procedure: CARDIOVERSION;  Surgeon: Frank Perla, MD;  Location: [facility] ENDOSCOPY;  Service: Cardiovascular;  Laterality: N/A;  . COLONOSCOPY WITH PROPOFOL N/A 06/03/2015   Procedure: COLONOSCOPY WITH PROPOFOL;  Surgeon: Frank Spates, MD;  Location: [facility] ENDOSCOPY;  Service: Endoscopy;  Laterality: N/A;  . HOT HEMOSTASIS N/A 06/03/2015   Procedure: HOT HEMOSTASIS (ARGON PLASMA COAGULATION/BICAP);  Surgeon: Frank Spates, MD;  Location: [facility] ENDOSCOPY;  Service: Endoscopy;  Laterality: N/A;    Current Medications: Outpatient Medications Prior to Visit  Medication Sig Dispense Refill  . amiodarone (PACERONE) 200 MG tablet Take 200 mg by mouth daily.    Marland Kitchen docusate sodium (COLACE) 100 MG capsule Take 100 mg by  mouth 2 (two) times daily as needed. For stool softener.    . hydrochlorothiazide (HYDRODIURIL) 25 MG tablet Take 25 mg by mouth daily.    Marland Kitchen liothyronine (CYTOMEL) 5 MCG tablet Take 5 mcg by mouth daily.    . metFORMIN (GLUCOPHAGE) 1000 MG tablet Take 1,000 mg by mouth 2 (two) times daily with a meal.    . metoprolol succinate (TOPROL-XL) 50 MG 24 hr tablet Take 1 tablet (50 mg total) by mouth daily.    . Multiple Vitamin (MULITIVITAMIN WITH MINERALS) TABS Take 1 tablet by mouth daily.    . Omega-3 Fatty Acids (FISH OIL PO) Take 2 capsules by mouth daily.    . pravastatin (PRAVACHOL) 80 MG tablet Take 80 mg by mouth at bedtime.     . quinapril (ACCUPRIL) 40 MG tablet Take 20-40 mg by mouth 2 (two) times daily. Take 1 tablet in AM, and 1/2 tablet in evening    . ranitidine (ZANTAC) 150 MG tablet Take 150 mg by mouth daily.    Marland Kitchen spironolactone (ALDACTONE) 25 MG tablet Take 1 tablet (25 mg total) by mouth daily. 90 tablet 3  . warfarin (COUMADIN) 5 MG tablet TAKE AS DIRECTED BY  ANTICOAGULATION 30 tablet 3  . levothyroxine (SYNTHROID, LEVOTHROID) 112 MCG tablet Take 112 mcg by mouth daily.     No facility-administered medications prior to visit.      Allergies:   Nsaids   Social History   Social History  . Marital status: Married  Spouse name: N/A  . Number of children: N/A  . Years of education: N/A   Social History Main Topics  . Smoking status: Never Smoker  . Smokeless tobacco: Never Used  . Alcohol use No  . Drug use: No  . Sexual activity: Not Asked   Other Topics Concern  . None   Social History Narrative  . None     Family History:  The patient's family history includes Coronary artery disease in his father.   ROS:   Please see the history of present illness.    Abdominal pain , recent chills, excessive fatigue, easy bruising.  All other systems reviewed and are negative.   PHYSICAL EXAM:   VS:  BP 130/68   Pulse (!) 49   Ht 6\' 4"  (1.93 m)   Wt 240 lb 3.2  oz (109 kg)   BMI 29.24 kg/m    GEN: Well nourished, well developed, in no acute distress  HEENT: normal  Neck: no JVD, carotid bruits, or masses Cardiac: RRR; no murmurs, rubs, or gallops,no edema  Respiratory:  clear to auscultation bilaterally, normal work of breathing GI: soft, nontender, nondistended, + BS MS: no deformity or atrophy  Skin: warm and dry, no rash Neuro:  Alert and Oriented x 3, Strength and sensation are intact Psych: euthymic mood, full affect  Wt Readings from Last 3 Encounters:  02/22/16 240 lb 3.2 oz (109 kg)  11/17/15 254 lb 1.9 oz (115.3 kg)  10/21/15 252 lb 6.4 oz (114.5 kg)      Studies/Labs Reviewed:   EKG:  EKG  Sinus bradycardia at 49 bpm. Otherwise unremarkable. A change compared to prior.  Recent Labs: 08/04/2015: Magnesium 1.7 10/07/2015: Hemoglobin 15.2; Platelets 182 11/17/2015: ALT 28; TSH 0.82 12/01/2015: BUN 20; Creat 1.57; Potassium 5.2; Sodium 139   Lipid Panel No results found for: CHOL, TRIG, HDL, CHOLHDL, VLDL, LDLCALC, LDLDIRECT  Additional studies/ records that were reviewed today include:  Laboratory data done in mid August by Dr. Hulan Gordon demonstrated normal hepatic function and TSH of 0.22. Adjustment and Levoxyl dose was made.    ASSESSMENT:    1. Paroxysmal atrial fibrillation (HCC)   2. Essential hypertension   3. Hyperlipidemia   4. Encounter for therapeutic drug monitoring   5. Obstructive sleep apnea      PLAN:  In order of problems listed above:  1. He is maintaining normal sinus rhythm on amiodarone. Metoprolol is reduced to 25 mg per day and amiodarone down to 100 mg per day. 2. Target blood pressure less than 140/90 mmHg. Low-salt diet 3. Not addressed 4. Amiodarone is decreased to 100 mg per day. TSH and he hepatic panel will be done on return in 4 months.    Medication Adjustments/Labs and Tests Ordered: Current medicines are reviewed at length with the patient today.  Concerns regarding medicines  are outlined above.  Medication changes, Labs and Tests ordered today are listed in the Patient Instructions below. There are no Patient Instructions on file for this visit.   Signed, Frank Grooms, MD  02/22/2016 9:55 AM    [address], [place], [place]  [address] Phone: [phone]; Fax: [phone]

## 2016-02-24 DIAGNOSIS — R194 Change in bowel habit: Secondary | ICD-10-CM | POA: Diagnosis not present

## 2016-02-24 DIAGNOSIS — I480 Paroxysmal atrial fibrillation: Secondary | ICD-10-CM | POA: Diagnosis not present

## 2016-02-24 DIAGNOSIS — C069 Malignant neoplasm of mouth, unspecified: Secondary | ICD-10-CM | POA: Diagnosis not present

## 2016-02-24 DIAGNOSIS — Z7901 Long term (current) use of anticoagulants: Secondary | ICD-10-CM | POA: Diagnosis not present

## 2016-02-24 DIAGNOSIS — D126 Benign neoplasm of colon, unspecified: Secondary | ICD-10-CM | POA: Diagnosis not present

## 2016-02-24 DIAGNOSIS — G4733 Obstructive sleep apnea (adult) (pediatric): Secondary | ICD-10-CM | POA: Diagnosis not present

## 2016-02-24 DIAGNOSIS — E039 Hypothyroidism, unspecified: Secondary | ICD-10-CM | POA: Diagnosis not present

## 2016-02-29 ENCOUNTER — Encounter: Payer: Self-pay | Admitting: Interventional Cardiology

## 2016-02-29 DIAGNOSIS — R899 Unspecified abnormal finding in specimens from other organs, systems and tissues: Secondary | ICD-10-CM | POA: Diagnosis not present

## 2016-02-29 DIAGNOSIS — E039 Hypothyroidism, unspecified: Secondary | ICD-10-CM | POA: Diagnosis not present

## 2016-03-02 DIAGNOSIS — N289 Disorder of kidney and ureter, unspecified: Secondary | ICD-10-CM | POA: Diagnosis not present

## 2016-03-02 DIAGNOSIS — Z23 Encounter for immunization: Secondary | ICD-10-CM | POA: Diagnosis not present

## 2016-03-03 ENCOUNTER — Ambulatory Visit (INDEPENDENT_AMBULATORY_CARE_PROVIDER_SITE_OTHER): Payer: Medicare Other | Admitting: *Deleted

## 2016-03-03 DIAGNOSIS — Z5181 Encounter for therapeutic drug level monitoring: Secondary | ICD-10-CM

## 2016-03-03 DIAGNOSIS — I480 Paroxysmal atrial fibrillation: Secondary | ICD-10-CM | POA: Diagnosis not present

## 2016-03-03 DIAGNOSIS — I4891 Unspecified atrial fibrillation: Secondary | ICD-10-CM | POA: Diagnosis not present

## 2016-03-03 LAB — POCT INR: INR: 2.5

## 2016-03-04 ENCOUNTER — Other Ambulatory Visit: Payer: Self-pay | Admitting: Family Medicine

## 2016-03-04 DIAGNOSIS — N289 Disorder of kidney and ureter, unspecified: Secondary | ICD-10-CM

## 2016-03-09 ENCOUNTER — Ambulatory Visit
Admission: RE | Admit: 2016-03-09 | Discharge: 2016-03-09 | Disposition: A | Discharge status: Home / Self Care | Payer: Medicare Other | Source: Ambulatory Visit | Attending: Family Medicine | Admitting: Family Medicine

## 2016-03-09 DIAGNOSIS — N289 Disorder of kidney and ureter, unspecified: Secondary | ICD-10-CM

## 2016-03-09 DIAGNOSIS — N281 Cyst of kidney, acquired: Secondary | ICD-10-CM | POA: Diagnosis not present

## 2016-03-23 DIAGNOSIS — Z23 Encounter for immunization: Secondary | ICD-10-CM | POA: Diagnosis not present

## 2016-03-23 DIAGNOSIS — I480 Paroxysmal atrial fibrillation: Secondary | ICD-10-CM | POA: Diagnosis not present

## 2016-03-23 DIAGNOSIS — Z7984 Long term (current) use of oral hypoglycemic drugs: Secondary | ICD-10-CM | POA: Diagnosis not present

## 2016-03-23 DIAGNOSIS — E039 Hypothyroidism, unspecified: Secondary | ICD-10-CM | POA: Diagnosis not present

## 2016-03-23 DIAGNOSIS — N189 Chronic kidney disease, unspecified: Secondary | ICD-10-CM | POA: Diagnosis not present

## 2016-03-23 DIAGNOSIS — E1122 Type 2 diabetes mellitus with diabetic chronic kidney disease: Secondary | ICD-10-CM | POA: Diagnosis not present

## 2016-03-27 DIAGNOSIS — R194 Change in bowel habit: Secondary | ICD-10-CM | POA: Diagnosis not present

## 2016-03-27 DIAGNOSIS — Z7984 Long term (current) use of oral hypoglycemic drugs: Secondary | ICD-10-CM | POA: Diagnosis not present

## 2016-03-27 DIAGNOSIS — D126 Benign neoplasm of colon, unspecified: Secondary | ICD-10-CM | POA: Diagnosis not present

## 2016-03-27 DIAGNOSIS — E118 Type 2 diabetes mellitus with unspecified complications: Secondary | ICD-10-CM | POA: Diagnosis not present

## 2016-03-28 DIAGNOSIS — Z23 Encounter for immunization: Secondary | ICD-10-CM | POA: Diagnosis not present

## 2016-03-28 DIAGNOSIS — L219 Seborrheic dermatitis, unspecified: Secondary | ICD-10-CM | POA: Diagnosis not present

## 2016-03-28 DIAGNOSIS — L821 Other seborrheic keratosis: Secondary | ICD-10-CM | POA: Diagnosis not present

## 2016-03-28 DIAGNOSIS — L570 Actinic keratosis: Secondary | ICD-10-CM | POA: Diagnosis not present

## 2016-03-28 DIAGNOSIS — D225 Melanocytic nevi of trunk: Secondary | ICD-10-CM | POA: Diagnosis not present

## 2016-03-31 ENCOUNTER — Ambulatory Visit (INDEPENDENT_AMBULATORY_CARE_PROVIDER_SITE_OTHER): Payer: Medicare Other | Admitting: Pharmacist

## 2016-03-31 DIAGNOSIS — I480 Paroxysmal atrial fibrillation: Secondary | ICD-10-CM

## 2016-03-31 DIAGNOSIS — N183 Chronic kidney disease, stage 3 (moderate): Secondary | ICD-10-CM | POA: Diagnosis not present

## 2016-03-31 DIAGNOSIS — E1129 Type 2 diabetes mellitus with other diabetic kidney complication: Secondary | ICD-10-CM | POA: Diagnosis not present

## 2016-03-31 DIAGNOSIS — Z5181 Encounter for therapeutic drug level monitoring: Secondary | ICD-10-CM | POA: Diagnosis not present

## 2016-03-31 DIAGNOSIS — I10 Essential (primary) hypertension: Secondary | ICD-10-CM | POA: Diagnosis not present

## 2016-03-31 DIAGNOSIS — I4891 Unspecified atrial fibrillation: Secondary | ICD-10-CM

## 2016-03-31 LAB — POCT INR: INR: 2.2

## 2016-04-12 ENCOUNTER — Other Ambulatory Visit: Payer: Self-pay | Admitting: Interventional Cardiology

## 2016-04-26 DIAGNOSIS — E113393 Type 2 diabetes mellitus with moderate nonproliferative diabetic retinopathy without macular edema, bilateral: Secondary | ICD-10-CM | POA: Diagnosis not present

## 2016-04-26 DIAGNOSIS — H35033 Hypertensive retinopathy, bilateral: Secondary | ICD-10-CM | POA: Diagnosis not present

## 2016-04-28 ENCOUNTER — Ambulatory Visit (INDEPENDENT_AMBULATORY_CARE_PROVIDER_SITE_OTHER): Payer: Medicare Other | Admitting: *Deleted

## 2016-04-28 DIAGNOSIS — Z5181 Encounter for therapeutic drug level monitoring: Secondary | ICD-10-CM | POA: Diagnosis not present

## 2016-04-28 DIAGNOSIS — I4891 Unspecified atrial fibrillation: Secondary | ICD-10-CM | POA: Diagnosis not present

## 2016-04-28 DIAGNOSIS — I480 Paroxysmal atrial fibrillation: Secondary | ICD-10-CM | POA: Diagnosis not present

## 2016-04-28 LAB — POCT INR: INR: 2

## 2016-05-11 DIAGNOSIS — E1129 Type 2 diabetes mellitus with other diabetic kidney complication: Secondary | ICD-10-CM | POA: Diagnosis not present

## 2016-05-11 DIAGNOSIS — N183 Chronic kidney disease, stage 3 (moderate): Secondary | ICD-10-CM | POA: Diagnosis not present

## 2016-05-11 DIAGNOSIS — Z6829 Body mass index (BMI) 29.0-29.9, adult: Secondary | ICD-10-CM | POA: Diagnosis not present

## 2016-05-11 DIAGNOSIS — I10 Essential (primary) hypertension: Secondary | ICD-10-CM | POA: Diagnosis not present

## 2016-05-11 DIAGNOSIS — I480 Paroxysmal atrial fibrillation: Secondary | ICD-10-CM | POA: Diagnosis not present

## 2016-05-15 ENCOUNTER — Encounter: Payer: Self-pay | Admitting: Interventional Cardiology

## 2016-05-15 DIAGNOSIS — E875 Hyperkalemia: Secondary | ICD-10-CM | POA: Diagnosis not present

## 2016-05-26 ENCOUNTER — Encounter: Payer: Self-pay | Admitting: Interventional Cardiology

## 2016-05-26 ENCOUNTER — Ambulatory Visit (INDEPENDENT_AMBULATORY_CARE_PROVIDER_SITE_OTHER): Payer: Medicare Other | Admitting: *Deleted

## 2016-05-26 DIAGNOSIS — Z5181 Encounter for therapeutic drug level monitoring: Secondary | ICD-10-CM | POA: Diagnosis not present

## 2016-05-26 DIAGNOSIS — I4891 Unspecified atrial fibrillation: Secondary | ICD-10-CM | POA: Diagnosis not present

## 2016-05-26 DIAGNOSIS — I480 Paroxysmal atrial fibrillation: Secondary | ICD-10-CM

## 2016-05-26 LAB — POCT INR: INR: 2.1

## 2016-05-28 ENCOUNTER — Other Ambulatory Visit: Payer: Self-pay | Admitting: Interventional Cardiology

## 2016-05-30 ENCOUNTER — Other Ambulatory Visit: Payer: Self-pay | Admitting: Interventional Cardiology

## 2016-05-30 DIAGNOSIS — H35033 Hypertensive retinopathy, bilateral: Secondary | ICD-10-CM | POA: Diagnosis not present

## 2016-05-30 DIAGNOSIS — Z5181 Encounter for therapeutic drug level monitoring: Secondary | ICD-10-CM

## 2016-05-30 DIAGNOSIS — E113393 Type 2 diabetes mellitus with moderate nonproliferative diabetic retinopathy without macular edema, bilateral: Secondary | ICD-10-CM | POA: Diagnosis not present

## 2016-05-30 DIAGNOSIS — I480 Paroxysmal atrial fibrillation: Secondary | ICD-10-CM

## 2016-05-30 DIAGNOSIS — I10 Essential (primary) hypertension: Secondary | ICD-10-CM

## 2016-05-30 DIAGNOSIS — G4733 Obstructive sleep apnea (adult) (pediatric): Secondary | ICD-10-CM

## 2016-05-30 DIAGNOSIS — H2513 Age-related nuclear cataract, bilateral: Secondary | ICD-10-CM | POA: Diagnosis not present

## 2016-05-30 MED ORDER — METOPROLOL SUCCINATE ER 25 MG PO TB24: 25.0000 mg | ORAL_TABLET | Freq: Every day | ORAL | 8 refills | Status: DC

## 2016-06-01 ENCOUNTER — Other Ambulatory Visit: Payer: Self-pay | Admitting: Interventional Cardiology

## 2016-06-07 DIAGNOSIS — I10 Essential (primary) hypertension: Secondary | ICD-10-CM | POA: Diagnosis not present

## 2016-06-07 DIAGNOSIS — E1122 Type 2 diabetes mellitus with diabetic chronic kidney disease: Secondary | ICD-10-CM | POA: Diagnosis not present

## 2016-06-07 DIAGNOSIS — Z7984 Long term (current) use of oral hypoglycemic drugs: Secondary | ICD-10-CM | POA: Diagnosis not present

## 2016-06-07 DIAGNOSIS — G4730 Sleep apnea, unspecified: Secondary | ICD-10-CM | POA: Diagnosis not present

## 2016-06-07 DIAGNOSIS — E782 Mixed hyperlipidemia: Secondary | ICD-10-CM | POA: Diagnosis not present

## 2016-06-07 DIAGNOSIS — I4891 Unspecified atrial fibrillation: Secondary | ICD-10-CM | POA: Diagnosis not present

## 2016-06-07 DIAGNOSIS — E039 Hypothyroidism, unspecified: Secondary | ICD-10-CM | POA: Diagnosis not present

## 2016-06-07 DIAGNOSIS — N189 Chronic kidney disease, unspecified: Secondary | ICD-10-CM | POA: Diagnosis not present

## 2016-06-07 DIAGNOSIS — Z125 Encounter for screening for malignant neoplasm of prostate: Secondary | ICD-10-CM | POA: Diagnosis not present

## 2016-06-07 DIAGNOSIS — Z0000 Encounter for general adult medical examination without abnormal findings: Secondary | ICD-10-CM | POA: Diagnosis not present

## 2016-06-12 ENCOUNTER — Encounter: Payer: Self-pay | Admitting: Interventional Cardiology

## 2016-06-12 ENCOUNTER — Ambulatory Visit (INDEPENDENT_AMBULATORY_CARE_PROVIDER_SITE_OTHER): Payer: Medicare Other | Admitting: Interventional Cardiology

## 2016-06-12 ENCOUNTER — Ambulatory Visit (INDEPENDENT_AMBULATORY_CARE_PROVIDER_SITE_OTHER): Payer: Medicare Other | Admitting: *Deleted

## 2016-06-12 VITALS — BP 138/72 | HR 51 | Ht 76.0 in | Wt 232.2 lb

## 2016-06-12 DIAGNOSIS — E784 Other hyperlipidemia: Secondary | ICD-10-CM

## 2016-06-12 DIAGNOSIS — I4891 Unspecified atrial fibrillation: Secondary | ICD-10-CM

## 2016-06-12 DIAGNOSIS — I10 Essential (primary) hypertension: Secondary | ICD-10-CM | POA: Diagnosis not present

## 2016-06-12 DIAGNOSIS — G4733 Obstructive sleep apnea (adult) (pediatric): Secondary | ICD-10-CM

## 2016-06-12 DIAGNOSIS — I480 Paroxysmal atrial fibrillation: Secondary | ICD-10-CM

## 2016-06-12 DIAGNOSIS — Z5181 Encounter for therapeutic drug level monitoring: Secondary | ICD-10-CM

## 2016-06-12 DIAGNOSIS — C140 Malignant neoplasm of pharynx, unspecified: Secondary | ICD-10-CM

## 2016-06-12 DIAGNOSIS — Z9229 Personal history of other drug therapy: Secondary | ICD-10-CM

## 2016-06-12 DIAGNOSIS — E7849 Other hyperlipidemia: Secondary | ICD-10-CM

## 2016-06-12 LAB — POCT INR: INR: 1.5

## 2016-06-12 NOTE — Progress Notes (Signed)
Cardiology Office Note    Date:  06/12/2016   ID:  [name], DOB 09/29/1950, MRN [id number]  PCP:  Frank Pac, MD  Cardiologist: Frank Grooms, MD   Chief Complaint  Patient presents with  . Coronary Artery Disease    History of Present Illness:  [name] is a 65 y.o. male presents for paroxysmal A. fib, chronic amiodarone therapy, hypertension, diabetes, and chronic anticoagulation therapy  Since cardioversion, he has done well with reference to atrial fibrillation. He is tolerating amiodarone without difficulty. Since I last saw him he has lost 40 pounds. He feels cold all the time. He is also seen his primary care physician and nephrologist (Dr. Lorrene Gordon).   Past Medical History:  Diagnosis Date  . CAD (coronary artery disease)    70% LCx stenosis, treated medically  . Diabetes mellitus   . GERD (gastroesophageal reflux disease)   . Hypertension   . Hypotension   . Left atrial enlargement   . Persistent atrial fibrillation ([place])   . Sleep apnea   . tonsillar ca dx'd 02/2006   xrt/chemo comp 05/2006    Past Surgical History:  Procedure Laterality Date  . BREAST SURGERY    . CARDIAC CATHETERIZATION    . CARDIOVERSION N/A 10/14/2015   Procedure: CARDIOVERSION;  Surgeon: Lelon Perla, MD;  Location: [facility] ENDOSCOPY;  Service: Cardiovascular;  Laterality: N/A;  . COLONOSCOPY WITH PROPOFOL N/A 06/03/2015   Procedure: COLONOSCOPY WITH PROPOFOL;  Surgeon: Laurence Spates, MD;  Location: [facility] ENDOSCOPY;  Service: Endoscopy;  Laterality: N/A;  . HOT HEMOSTASIS N/A 06/03/2015   Procedure: HOT HEMOSTASIS (ARGON PLASMA COAGULATION/BICAP);  Surgeon: Laurence Spates, MD;  Location: [facility] ENDOSCOPY;  Service: Endoscopy;  Laterality: N/A;    Current Medications: Outpatient Medications Prior to Visit  Medication Sig Dispense Refill  . amiodarone (PACERONE) 200 MG tablet Take 1/2 tablet (100 mg) by mouth daily 45 tablet 1  . dicyclomine (BENTYL) 20 MG tablet Take 20 mg  by mouth 3 (three) times daily before meals.    . docusate sodium (COLACE) 100 MG capsule Take 100 mg by mouth 2 (two) times daily as needed. For stool softener.    . hydrochlorothiazide (HYDRODIURIL) 25 MG tablet Take 25 mg by mouth daily.    Marland Kitchen levothyroxine (SYNTHROID, LEVOTHROID) 100 MCG tablet Take 100 mcg by mouth daily before breakfast.    . liothyronine (CYTOMEL) 5 MCG tablet Take 5 mcg by mouth daily.    . metFORMIN (GLUCOPHAGE) 1000 MG tablet Take 1,000 mg by mouth 2 (two) times daily with a meal.    . metoprolol succinate (TOPROL XL) 25 MG 24 hr tablet Take 1 tablet (25 mg total) by mouth daily. 30 tablet 8  . Multiple Vitamin (MULITIVITAMIN WITH MINERALS) TABS Take 1 tablet by mouth daily.    . Omega-3 Fatty Acids (FISH OIL PO) Take 2 capsules by mouth daily.    . pravastatin (PRAVACHOL) 80 MG tablet Take 80 mg by mouth at bedtime.     . quinapril (ACCUPRIL) 40 MG tablet Take 20-40 mg by mouth 2 (two) times daily. Take 1 tablet in AM, and 1/2 tablet in evening    . ranitidine (ZANTAC) 150 MG tablet Take 150 mg by mouth daily.    Marland Kitchen spironolactone (ALDACTONE) 25 MG tablet Take 1 tablet (25 mg total) by mouth daily. 90 tablet 3  . warfarin (COUMADIN) 5 MG tablet TAKE AS DIRECTED BY  ANTICOAGULATION 30 tablet 3   No facility-administered medications  prior to visit.      Allergies:   Nsaids   Social History   Social History  . Marital status: Married    Spouse name: N/A  . Number of children: N/A  . Years of education: N/A   Social History Main Topics  . Smoking status: Never Smoker  . Smokeless tobacco: Never Used  . Alcohol use No  . Drug use: No  . Sexual activity: Not Asked   Other Topics Concern  . None   Social History Narrative  . None     Family History:  The patient's family history includes Coronary artery disease in his father.   ROS:   Please see the history of present illness.    Depression, easy bruising, constipation, difficulty with balance and  walking, easy bruising, explosive diarrhea, occasional irregular heartbeat, and excessive fatigue. Recent chills. Cold intolerance since the summer.  All other systems reviewed and are negative.   PHYSICAL EXAM:   VS:  BP 138/72 (BP Location: Left Arm)   Pulse (!) 51   Ht 6\' 4"  (1.93 m)   Wt 232 lb 3.2 oz (105.3 kg)   BMI 28.26 kg/m    GEN: Well nourished, well developed, in no acute distress  HEENT: normal  Neck: no JVD, carotid bruits, or masses Cardiac: RRR; no murmurs, rubs, or gallops,no edema  Respiratory:  clear to auscultation bilaterally, normal work of breathing GI: soft, nontender, nondistended, + BS MS: no deformity or atrophy  Skin: warm and dry, no rash Neuro:  Alert and Oriented x 3, Strength and sensation are intact Psych: euthymic mood, full affect  Wt Readings from Last 3 Encounters:  06/12/16 232 lb 3.2 oz (105.3 kg)  02/22/16 240 lb 3.2 oz (109 kg)  11/17/15 254 lb 1.9 oz (115.3 kg)      Studies/Labs Reviewed:   EKG:  EKG  No new tracing  Recent Labs: 08/04/2015: Magnesium 1.7 10/07/2015: Hemoglobin 15.2; Platelets 182 11/17/2015: ALT 28; TSH 0.82 12/01/2015: BUN 20; Creat 1.57; Potassium 5.2; Sodium 139   Lipid Panel No results found for: CHOL, TRIG, HDL, CHOLHDL, VLDL, LDLCALC, LDLDIRECT  Additional studies/ records that were reviewed today include:  Overall laboratory data from nephrology and primary care done within the last month include creatinine of 1.5 sodium of 139 potassium of 6.0 and after adjustments 5.0. TSH is 0.47 done at [facility] on 06/07/16. PSA 0.36. Creatinine at [facility] on 06/07/16 1.4. Potassium 5.1. Liver panel is normal. Hemoglobin A1c is 6.4    ASSESSMENT:    1. Paroxysmal atrial fibrillation (HCC)   2. Essential hypertension   3. 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 Other hyperlipidemia   6. Hx of amiodarone therapy      PLAN:  In order of problems listed above:  1. Controlled on chronic amiodarone  therapy, 100 mg daily. Recent laboratory data does not reveal any significant thyroid or liver impairment on amiodarone. 2. Very well controlled except early a.m. readings prior to medications can sometimes be in the 99991111 mmHg systolic range. After medications on board systolic pressures run less than 140 mmHg. 3. Continue C Pap use. With weight loss, sleep apnea is likely better. 4. Not addressed 5. Not addressed 6. TSH and hepatic panel in 6 months when he returns for follow-up.    Medication Adjustments/Labs and Tests Ordered: Current medicines are reviewed at length with the patient today.  Concerns regarding medicines are outlined above.  Medication changes, Labs and  Tests ordered today are listed in the Patient Instructions below. Patient Instructions  Medication Instructions:  None  Labwork: None  Testing/Procedures: None  Follow-Up: Your physician wants you to follow-up in: 6 months with Dr. [name].  You will receive a reminder letter in the mail two months in advance. If you don't receive a letter, please call our office to schedule the follow-up appointment.    Any Other Special Instructions Will Be Listed Below (If Applicable).     If you need a refill on your cardiac medications before your next appointment, please call your pharmacy.      Signed, Frank Grooms, MD  06/12/2016 10:25 AM    [address], [place], [place]  [address] Phone: [phone]; Fax: [phone]

## 2016-06-12 NOTE — Patient Instructions (Signed)
Medication Instructions:  None  Labwork: None  Testing/Procedures: None  Follow-Up: Your physician wants you to follow-up in: 6 months with Dr. Smith.  You will receive a reminder letter in the mail two months in advance. If you don't receive a letter, please call our office to schedule the follow-up appointment.   Any Other Special Instructions Will Be Listed Below (If Applicable).     If you need a refill on your cardiac medications before your next appointment, please call your pharmacy.   

## 2016-06-27 ENCOUNTER — Ambulatory Visit (INDEPENDENT_AMBULATORY_CARE_PROVIDER_SITE_OTHER): Payer: Medicare Other | Admitting: Pharmacist

## 2016-06-27 DIAGNOSIS — I480 Paroxysmal atrial fibrillation: Secondary | ICD-10-CM | POA: Diagnosis not present

## 2016-06-27 DIAGNOSIS — Z5181 Encounter for therapeutic drug level monitoring: Secondary | ICD-10-CM

## 2016-06-27 DIAGNOSIS — I4891 Unspecified atrial fibrillation: Secondary | ICD-10-CM | POA: Diagnosis not present

## 2016-06-27 LAB — POCT INR: INR: 1.8

## 2016-07-11 ENCOUNTER — Ambulatory Visit (INDEPENDENT_AMBULATORY_CARE_PROVIDER_SITE_OTHER): Payer: Medicare Other | Admitting: *Deleted

## 2016-07-11 DIAGNOSIS — I480 Paroxysmal atrial fibrillation: Secondary | ICD-10-CM | POA: Diagnosis not present

## 2016-07-11 DIAGNOSIS — Z5181 Encounter for therapeutic drug level monitoring: Secondary | ICD-10-CM

## 2016-07-11 DIAGNOSIS — I4891 Unspecified atrial fibrillation: Secondary | ICD-10-CM

## 2016-07-11 LAB — POCT INR: INR: 1.7

## 2016-07-25 ENCOUNTER — Ambulatory Visit (INDEPENDENT_AMBULATORY_CARE_PROVIDER_SITE_OTHER): Payer: Medicare Other | Admitting: *Deleted

## 2016-07-25 DIAGNOSIS — I4891 Unspecified atrial fibrillation: Secondary | ICD-10-CM | POA: Diagnosis not present

## 2016-07-25 DIAGNOSIS — Z5181 Encounter for therapeutic drug level monitoring: Secondary | ICD-10-CM | POA: Diagnosis not present

## 2016-07-25 DIAGNOSIS — I480 Paroxysmal atrial fibrillation: Secondary | ICD-10-CM | POA: Diagnosis not present

## 2016-07-25 LAB — POCT INR: INR: 2.1

## 2016-08-02 DIAGNOSIS — D126 Benign neoplasm of colon, unspecified: Secondary | ICD-10-CM | POA: Diagnosis not present

## 2016-08-02 DIAGNOSIS — Z7901 Long term (current) use of anticoagulants: Secondary | ICD-10-CM | POA: Diagnosis not present

## 2016-08-02 DIAGNOSIS — G4733 Obstructive sleep apnea (adult) (pediatric): Secondary | ICD-10-CM | POA: Diagnosis not present

## 2016-08-02 DIAGNOSIS — R197 Diarrhea, unspecified: Secondary | ICD-10-CM | POA: Diagnosis not present

## 2016-08-02 DIAGNOSIS — E118 Type 2 diabetes mellitus with unspecified complications: Secondary | ICD-10-CM | POA: Diagnosis not present

## 2016-08-02 DIAGNOSIS — N189 Chronic kidney disease, unspecified: Secondary | ICD-10-CM | POA: Diagnosis not present

## 2016-08-02 DIAGNOSIS — I480 Paroxysmal atrial fibrillation: Secondary | ICD-10-CM | POA: Diagnosis not present

## 2016-08-02 DIAGNOSIS — E039 Hypothyroidism, unspecified: Secondary | ICD-10-CM | POA: Diagnosis not present

## 2016-08-02 DIAGNOSIS — C069 Malignant neoplasm of mouth, unspecified: Secondary | ICD-10-CM | POA: Diagnosis not present

## 2016-08-10 DIAGNOSIS — R197 Diarrhea, unspecified: Secondary | ICD-10-CM | POA: Diagnosis not present

## 2016-08-15 ENCOUNTER — Ambulatory Visit (INDEPENDENT_AMBULATORY_CARE_PROVIDER_SITE_OTHER): Payer: Medicare Other | Admitting: *Deleted

## 2016-08-15 DIAGNOSIS — I4891 Unspecified atrial fibrillation: Secondary | ICD-10-CM

## 2016-08-15 DIAGNOSIS — Z5181 Encounter for therapeutic drug level monitoring: Secondary | ICD-10-CM

## 2016-08-15 DIAGNOSIS — I480 Paroxysmal atrial fibrillation: Secondary | ICD-10-CM

## 2016-08-15 LAB — POCT INR: INR: 2.4

## 2016-09-06 DIAGNOSIS — R197 Diarrhea, unspecified: Secondary | ICD-10-CM | POA: Diagnosis not present

## 2016-09-06 DIAGNOSIS — E039 Hypothyroidism, unspecified: Secondary | ICD-10-CM | POA: Diagnosis not present

## 2016-09-06 DIAGNOSIS — I4891 Unspecified atrial fibrillation: Secondary | ICD-10-CM | POA: Diagnosis not present

## 2016-09-06 DIAGNOSIS — Z7901 Long term (current) use of anticoagulants: Secondary | ICD-10-CM | POA: Diagnosis not present

## 2016-09-06 DIAGNOSIS — N189 Chronic kidney disease, unspecified: Secondary | ICD-10-CM | POA: Diagnosis not present

## 2016-09-06 DIAGNOSIS — E118 Type 2 diabetes mellitus with unspecified complications: Secondary | ICD-10-CM | POA: Diagnosis not present

## 2016-09-06 DIAGNOSIS — Z7984 Long term (current) use of oral hypoglycemic drugs: Secondary | ICD-10-CM | POA: Diagnosis not present

## 2016-09-08 ENCOUNTER — Other Ambulatory Visit: Payer: Self-pay | Admitting: Interventional Cardiology

## 2016-09-12 ENCOUNTER — Ambulatory Visit (INDEPENDENT_AMBULATORY_CARE_PROVIDER_SITE_OTHER): Payer: Medicare Other | Admitting: Pharmacist

## 2016-09-12 DIAGNOSIS — Z5181 Encounter for therapeutic drug level monitoring: Secondary | ICD-10-CM | POA: Diagnosis not present

## 2016-09-12 DIAGNOSIS — I4891 Unspecified atrial fibrillation: Secondary | ICD-10-CM | POA: Diagnosis not present

## 2016-09-12 DIAGNOSIS — I480 Paroxysmal atrial fibrillation: Secondary | ICD-10-CM

## 2016-09-12 LAB — POCT INR: INR: 2.3

## 2016-10-04 DIAGNOSIS — L84 Corns and callosities: Secondary | ICD-10-CM | POA: Diagnosis not present

## 2016-10-10 ENCOUNTER — Ambulatory Visit (INDEPENDENT_AMBULATORY_CARE_PROVIDER_SITE_OTHER): Payer: Medicare Other | Admitting: Pharmacist

## 2016-10-10 DIAGNOSIS — I480 Paroxysmal atrial fibrillation: Secondary | ICD-10-CM

## 2016-10-10 DIAGNOSIS — Z5181 Encounter for therapeutic drug level monitoring: Secondary | ICD-10-CM | POA: Diagnosis not present

## 2016-10-10 DIAGNOSIS — I4891 Unspecified atrial fibrillation: Secondary | ICD-10-CM | POA: Diagnosis not present

## 2016-10-10 LAB — POCT INR: INR: 2.6

## 2016-11-07 ENCOUNTER — Other Ambulatory Visit: Payer: Self-pay | Admitting: Internal Medicine

## 2016-11-07 DIAGNOSIS — N183 Chronic kidney disease, stage 3 (moderate): Secondary | ICD-10-CM | POA: Diagnosis not present

## 2016-11-07 DIAGNOSIS — I10 Essential (primary) hypertension: Secondary | ICD-10-CM | POA: Diagnosis not present

## 2016-11-07 DIAGNOSIS — I480 Paroxysmal atrial fibrillation: Secondary | ICD-10-CM | POA: Diagnosis not present

## 2016-11-07 DIAGNOSIS — Z6829 Body mass index (BMI) 29.0-29.9, adult: Secondary | ICD-10-CM | POA: Diagnosis not present

## 2016-11-07 DIAGNOSIS — R6889 Other general symptoms and signs: Secondary | ICD-10-CM | POA: Diagnosis not present

## 2016-11-07 DIAGNOSIS — E875 Hyperkalemia: Secondary | ICD-10-CM | POA: Diagnosis not present

## 2016-11-07 DIAGNOSIS — E1129 Type 2 diabetes mellitus with other diabetic kidney complication: Secondary | ICD-10-CM | POA: Diagnosis not present

## 2016-11-14 ENCOUNTER — Other Ambulatory Visit: Payer: Self-pay | Admitting: Internal Medicine

## 2016-11-14 ENCOUNTER — Telehealth: Payer: Self-pay | Admitting: Interventional Cardiology

## 2016-11-14 NOTE — Telephone Encounter (Signed)
New message    [name] is calling from [facility] to confirm that pt is on amiodarone and warfarin.

## 2016-11-14 NOTE — Telephone Encounter (Signed)
Pharmacy wanted to make sure patient was still taking warfarin and amiodarone. Informed pharmacy that patient has been on this medications for a while and there have been no changes. Pharmacy verbalized understanding.

## 2016-11-15 ENCOUNTER — Encounter: Payer: Self-pay | Admitting: Podiatry

## 2016-11-15 ENCOUNTER — Ambulatory Visit (INDEPENDENT_AMBULATORY_CARE_PROVIDER_SITE_OTHER): Payer: Medicare Other | Admitting: Podiatry

## 2016-11-15 VITALS — BP 168/89 | HR 54

## 2016-11-15 DIAGNOSIS — Q828 Other specified congenital malformations of skin: Secondary | ICD-10-CM | POA: Diagnosis not present

## 2016-11-15 DIAGNOSIS — E1142 Type 2 diabetes mellitus with diabetic polyneuropathy: Secondary | ICD-10-CM
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
## 2016-11-21 ENCOUNTER — Ambulatory Visit (INDEPENDENT_AMBULATORY_CARE_PROVIDER_SITE_OTHER): Payer: Medicare Other | Admitting: Pharmacist

## 2016-11-21 DIAGNOSIS — Z5181 Encounter for therapeutic drug level monitoring: Secondary | ICD-10-CM | POA: Diagnosis not present

## 2016-11-21 DIAGNOSIS — I4891 Unspecified atrial fibrillation: Secondary | ICD-10-CM

## 2016-11-21 DIAGNOSIS — I480 Paroxysmal atrial fibrillation: Secondary | ICD-10-CM | POA: Diagnosis not present

## 2016-11-21 LAB — POCT INR: INR: 2.7

## 2016-11-30 DIAGNOSIS — H1849 Other corneal degeneration: Secondary | ICD-10-CM | POA: Diagnosis not present

## 2016-11-30 DIAGNOSIS — E113393 Type 2 diabetes mellitus with moderate nonproliferative diabetic retinopathy without macular edema, bilateral: Secondary | ICD-10-CM | POA: Diagnosis not present

## 2016-11-30 DIAGNOSIS — H2513 Age-related nuclear cataract, bilateral: Secondary | ICD-10-CM | POA: Diagnosis not present

## 2016-12-07 NOTE — Progress Notes (Signed)
Cardiology Office Note    Date:  12/08/2016   ID:  PHUOC HUY, DOB Dec 11, 1950, MRN [id number]  PCP:  [name], MD  Cardiologist: Sinclair Grooms, MD   Chief Complaint  Patient presents with  . Atrial Fibrillation  . Congestive Heart Failure    History of Present Illness:  [name] is a 66 y.o. male presents for paroxysmal A. fib, chronic amiodarone therapy, hypertension, diabetes, and chronic anticoagulation therapy.  He believes he was in atrial fibrillation in April. This lasted about 2 weeks. He was associated with some mild lower extremity swelling. He denies swelling, shortness of breath, and dyspnea at this point. No anginal quality chest pain. Still with some orthostatic dizziness from time to time. No blood in the urine or stool. He is compliant with his medicine regimen.  Past Medical History:  Diagnosis Date  . CAD (coronary artery disease)    70% LCx stenosis, treated medically  . Diabetes mellitus   . GERD (gastroesophageal reflux disease)   . Hypertension   . Hypotension   . Left atrial enlargement   . Persistent atrial fibrillation ([place])   . Sleep apnea   . tonsillar ca dx'd 02/2006   xrt/chemo comp 05/2006    Past Surgical History:  Procedure Laterality Date  . BREAST SURGERY    . CARDIAC CATHETERIZATION    . CARDIOVERSION N/A 10/14/2015   Procedure: CARDIOVERSION;  Surgeon: Lelon Perla, MD;  Location: [facility] ENDOSCOPY;  Service: Cardiovascular;  Laterality: N/A;  . COLONOSCOPY WITH PROPOFOL N/A 06/03/2015   Procedure: COLONOSCOPY WITH PROPOFOL;  Surgeon: Laurence Spates, MD;  Location: [facility] ENDOSCOPY;  Service: Endoscopy;  Laterality: N/A;  . HOT HEMOSTASIS N/A 06/03/2015   Procedure: HOT HEMOSTASIS (ARGON PLASMA COAGULATION/BICAP);  Surgeon: Laurence Spates, MD;  Location: [facility] ENDOSCOPY;  Service: Endoscopy;  Laterality: N/A;    Current Medications: Outpatient Medications Prior to Visit  Medication Sig Dispense Refill  . amiodarone  (PACERONE) 200 MG tablet Take 1/2 tablet (100 mg) by mouth daily 45 tablet 1  . dicyclomine (BENTYL) 20 MG tablet Take 20 mg by mouth 3 (three) times daily before meals.    . docusate sodium (COLACE) 100 MG capsule Take 100 mg by mouth 2 (two) times daily as needed. For stool softener.    . hydrochlorothiazide (HYDRODIURIL) 25 MG tablet Take 25 mg by mouth daily.    Marland Kitchen levothyroxine (SYNTHROID, LEVOTHROID) 100 MCG tablet Take 100 mcg by mouth daily before breakfast.    . liothyronine (CYTOMEL) 5 MCG tablet Take 5 mcg by mouth daily.    . metFORMIN (GLUCOPHAGE) 1000 MG tablet Take 1,000 mg by mouth 2 (two) times daily with a meal.    . metoprolol succinate (TOPROL XL) 25 MG 24 hr tablet Take 1 tablet (25 mg total) by mouth daily. 30 tablet 8  . Multiple Vitamin (MULITIVITAMIN WITH MINERALS) TABS Take 1 tablet by mouth daily.    . Omega-3 Fatty Acids (FISH OIL PO) Take 2 capsules by mouth daily.    . pravastatin (PRAVACHOL) 80 MG tablet Take 80 mg by mouth at bedtime.     . quinapril (ACCUPRIL) 40 MG tablet Take 20-40 mg by mouth 2 (two) times daily. Take 1 tablet in AM, and 1/2 tablet in evening    . ranitidine (ZANTAC) 150 MG tablet Take 150 mg by mouth daily.    Marland Kitchen spironolactone (ALDACTONE) 25 MG tablet TAKE ONE TABLET BY MOUTH ONCE DAILY 90 tablet 0  . warfarin (  COUMADIN) 5 MG tablet TAKE AS DIRECTED BY ANTICOAGULATION 30 tablet 3  . amiodarone (PACERONE) 200 MG tablet TAKE ONE TABLET BY MOUTH TWICE DAILY (Patient not taking: Reported on 12/08/2016) 180 tablet 1   No facility-administered medications prior to visit.      Allergies:   Nsaids   Social History   Social History  . Marital status: Married    Spouse name: N/A  . Number of children: N/A  . Years of education: N/A   Social History Main Topics  . Smoking status: Never Smoker  . Smokeless tobacco: Never Used  . Alcohol use No  . Drug use: No  . Sexual activity: Not Asked   Other Topics Concern  . None   Social History  Narrative  . None     Family History:  The patient's family history includes Coronary artery disease in his father.   ROS:   Please see the history of present illness.    Intermittent lower extremity swelling, recent chills, fatigue, irregular heartbeat diarrhea, easy bruising, bleeding, and difficulty with balance. Occasional constipation.  All other systems reviewed and are negative.   PHYSICAL EXAM:   VS:  BP (!) 164/82 (BP Location: Left Arm)   Pulse (!) 52   Ht 6\' 4"  (1.93 m)   Wt 228 lb 1.9 oz (103.5 kg)   BMI 27.77 kg/m    GEN: Well nourished, well developed, in no acute distress  HEENT: normal  Neck: no JVD, carotid bruits, or masses Cardiac: RRR; no murmurs, rubs, or gallops,no edema  Respiratory:  clear to auscultation bilaterally, normal work of breathing GI: soft, nontender, nondistended, + BS MS: no deformity or atrophy  Skin: warm and dry, no rash Neuro:  Alert and Oriented x 3, Strength and sensation are intact Psych: euthymic mood, full affect  Wt Readings from Last 3 Encounters:  12/08/16 228 lb 1.9 oz (103.5 kg)  06/12/16 232 lb 3.2 oz (105.3 kg)  02/22/16 240 lb 3.2 oz (109 kg)      Studies/Labs Reviewed:   EKG:  EKG  EKG demonstrates sinus bradycardia and is otherwise normal. Heart rate is 52 bpm  Recent Labs: No results found for requested labs within last 8760 hours.   Lipid Panel No results found for: CHOL, TRIG, HDL, CHOLHDL, VLDL, LDLCALC, LDLDIRECT  Additional studies/ records that were reviewed today include:  Nephrology did bloodwork and demonstrated creatinine of 1.53, BUN of 14, potassium of 5.4, albumin 4.4, TSH of 1.95 lipid panel was unremarkable.    ASSESSMENT:    1. Essential hypertension   2. Paroxysmal atrial fibrillation (HCC)   3. Hx of amiodarone therapy   4. Other hyperlipidemia   5. Obstructive sleep apnea   6. 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 Blood pressure with  significant orthostasis, 155-130 mm mercury upon standing. Not symptomatic. No change in therapy. 2. Likely had a 7 day episode of atrial fibrillation or flutter in April. Heart rate increased around 80 bpm and he is typically in the 50s. He was lethargic and had less energy with some lower extremity swelling. He had auto correction and now feels better. EKG today demonstrates sinus bradycardia. 3. No evidence of toxicity with recent TSH and liver panel unremarkable. 4. LDL was 48 when last checked in August 2017. Our target is less than 70. 5. Encouraged to continue positive airway pressure.  No change in current medical regimen. TSH hepatic panel  on return. Call of prolonged rapid or irregular heartbeat. Six-month clinical follow-up.  Medication Adjustments/Labs and Tests Ordered: Current medicines are reviewed at length with the patient today.  Concerns regarding medicines are outlined above.  Medication changes, Labs and Tests ordered today are listed in the Patient Instructions below. Patient Instructions  Medication Instructions:  None  Labwork: Liver and TSH at next appointment  Testing/Procedures: None  Follow-Up: Your physician wants you to follow-up in: 6 months with Dr. [name].  You will receive a reminder letter in the mail two months in advance. If you don't receive a letter, please call our office to schedule the follow-up appointment.   Any Other Special Instructions Will Be Listed Below (If Applicable).     If you need a refill on your cardiac medications before your next appointment, please call your pharmacy.      Signed, Sinclair Grooms, MD  12/08/2016 10:36 AM    [facility] [address], [place], [place]  [address] Phone: [phone]; Fax: [phone]

## 2016-12-08 ENCOUNTER — Encounter: Payer: Self-pay | Admitting: Interventional Cardiology

## 2016-12-08 ENCOUNTER — Ambulatory Visit (INDEPENDENT_AMBULATORY_CARE_PROVIDER_SITE_OTHER): Payer: Medicare Other | Admitting: *Deleted

## 2016-12-08 ENCOUNTER — Ambulatory Visit (INDEPENDENT_AMBULATORY_CARE_PROVIDER_SITE_OTHER): Payer: Medicare Other | Admitting: Interventional Cardiology

## 2016-12-08 VITALS — BP 164/82 | HR 52 | Ht 76.0 in | Wt 228.1 lb

## 2016-12-08 DIAGNOSIS — C140 Malignant neoplasm of pharynx, unspecified: Secondary | ICD-10-CM

## 2016-12-08 DIAGNOSIS — Z9229 Personal history of other drug therapy: Secondary | ICD-10-CM

## 2016-12-08 DIAGNOSIS — G4733 Obstructive sleep apnea (adult) (pediatric): Secondary | ICD-10-CM

## 2016-12-08 DIAGNOSIS — Z5181 Encounter for therapeutic drug level monitoring: Secondary | ICD-10-CM

## 2016-12-08 DIAGNOSIS — E784 Other hyperlipidemia: Secondary | ICD-10-CM

## 2016-12-08 DIAGNOSIS — I480 Paroxysmal atrial fibrillation: Secondary | ICD-10-CM | POA: Diagnosis not present

## 2016-12-08 DIAGNOSIS — I10 Essential (primary) hypertension: Secondary | ICD-10-CM | POA: Diagnosis not present

## 2016-12-08 DIAGNOSIS — E7849 Other hyperlipidemia: Secondary | ICD-10-CM

## 2016-12-08 LAB — POCT INR: INR: 2.8

## 2016-12-08 NOTE — Patient Instructions (Signed)
Medication Instructions:  None  Labwork: Liver and TSH at next appointment  Testing/Procedures: None  Follow-Up: Your physician wants you to follow-up in: 6 months with Dr. [name].  You will receive a reminder letter in the mail two months in advance. If you don't receive a letter, please call our office to schedule the follow-up appointment.   Any Other Special Instructions Will Be Listed Below (If Applicable).     If you need a refill on your cardiac medications before your next appointment, please call your pharmacy.

## 2017-01-16 ENCOUNTER — Other Ambulatory Visit: Payer: Self-pay | Admitting: Interventional Cardiology

## 2017-01-19 ENCOUNTER — Ambulatory Visit (INDEPENDENT_AMBULATORY_CARE_PROVIDER_SITE_OTHER): Payer: Medicare Other | Admitting: Pharmacist

## 2017-01-19 DIAGNOSIS — Z5181 Encounter for therapeutic drug level monitoring: Secondary | ICD-10-CM | POA: Diagnosis not present

## 2017-01-19 DIAGNOSIS — I480 Paroxysmal atrial fibrillation: Secondary | ICD-10-CM | POA: Diagnosis not present

## 2017-01-19 LAB — POCT INR: INR: 2.3

## 2017-01-24 DIAGNOSIS — I25119 Atherosclerotic heart disease of native coronary artery with unspecified angina pectoris: Secondary | ICD-10-CM | POA: Diagnosis not present

## 2017-01-24 DIAGNOSIS — N189 Chronic kidney disease, unspecified: Secondary | ICD-10-CM | POA: Diagnosis not present

## 2017-01-24 DIAGNOSIS — E039 Hypothyroidism, unspecified: Secondary | ICD-10-CM | POA: Diagnosis not present

## 2017-01-24 DIAGNOSIS — E118 Type 2 diabetes mellitus with unspecified complications: Secondary | ICD-10-CM | POA: Diagnosis not present

## 2017-01-24 DIAGNOSIS — Z7984 Long term (current) use of oral hypoglycemic drugs: Secondary | ICD-10-CM | POA: Diagnosis not present

## 2017-01-24 DIAGNOSIS — D126 Benign neoplasm of colon, unspecified: Secondary | ICD-10-CM | POA: Diagnosis not present

## 2017-01-24 DIAGNOSIS — R198 Other specified symptoms and signs involving the digestive system and abdomen: Secondary | ICD-10-CM | POA: Diagnosis not present

## 2017-01-24 DIAGNOSIS — G4733 Obstructive sleep apnea (adult) (pediatric): Secondary | ICD-10-CM | POA: Diagnosis not present

## 2017-01-24 DIAGNOSIS — C069 Malignant neoplasm of mouth, unspecified: Secondary | ICD-10-CM | POA: Diagnosis not present

## 2017-01-24 DIAGNOSIS — Z7901 Long term (current) use of anticoagulants: Secondary | ICD-10-CM | POA: Diagnosis not present

## 2017-01-24 DIAGNOSIS — I480 Paroxysmal atrial fibrillation: Secondary | ICD-10-CM | POA: Diagnosis not present

## 2017-02-04 ENCOUNTER — Other Ambulatory Visit: Payer: Self-pay | Admitting: Interventional Cardiology

## 2017-02-04 DIAGNOSIS — I10 Essential (primary) hypertension: Secondary | ICD-10-CM

## 2017-02-04 DIAGNOSIS — Z5181 Encounter for therapeutic drug level monitoring: Secondary | ICD-10-CM

## 2017-02-04 DIAGNOSIS — G4733 Obstructive sleep apnea (adult) (pediatric): Secondary | ICD-10-CM

## 2017-02-04 DIAGNOSIS — I480 Paroxysmal atrial fibrillation: Secondary | ICD-10-CM

## 2017-02-06 ENCOUNTER — Other Ambulatory Visit: Payer: Self-pay | Admitting: Interventional Cardiology

## 2017-02-19 DIAGNOSIS — G4733 Obstructive sleep apnea (adult) (pediatric): Secondary | ICD-10-CM | POA: Diagnosis not present

## 2017-03-02 ENCOUNTER — Ambulatory Visit (INDEPENDENT_AMBULATORY_CARE_PROVIDER_SITE_OTHER): Payer: Medicare Other | Admitting: Pharmacist

## 2017-03-02 DIAGNOSIS — I480 Paroxysmal atrial fibrillation: Secondary | ICD-10-CM

## 2017-03-02 DIAGNOSIS — Z5181 Encounter for therapeutic drug level monitoring: Secondary | ICD-10-CM | POA: Diagnosis not present

## 2017-03-02 LAB — POCT INR: INR: 2.3

## 2017-03-19 ENCOUNTER — Encounter: Payer: Self-pay | Admitting: Interventional Cardiology

## 2017-03-20 ENCOUNTER — Ambulatory Visit (INDEPENDENT_AMBULATORY_CARE_PROVIDER_SITE_OTHER): Payer: Medicare Other | Admitting: Nurse Practitioner

## 2017-03-20 ENCOUNTER — Telehealth: Payer: Self-pay

## 2017-03-20 ENCOUNTER — Encounter: Payer: Self-pay | Admitting: Nurse Practitioner

## 2017-03-20 VITALS — BP 180/80 | HR 49 | Ht 76.0 in | Wt 232.4 lb

## 2017-03-20 DIAGNOSIS — I10 Essential (primary) hypertension: Secondary | ICD-10-CM | POA: Diagnosis not present

## 2017-03-20 DIAGNOSIS — I480 Paroxysmal atrial fibrillation: Secondary | ICD-10-CM

## 2017-03-20 DIAGNOSIS — E7849 Other hyperlipidemia: Secondary | ICD-10-CM

## 2017-03-20 DIAGNOSIS — E784 Other hyperlipidemia: Secondary | ICD-10-CM | POA: Diagnosis not present

## 2017-03-20 DIAGNOSIS — Z9229 Personal history of other drug therapy: Secondary | ICD-10-CM | POA: Diagnosis not present

## 2017-03-20 MED ORDER — CEPHALEXIN 500 MG PO CAPS: 500.0000 mg | ORAL_CAPSULE | Freq: Four times a day (QID) | ORAL | 0 refills | Status: DC

## 2017-03-20 NOTE — Progress Notes (Signed)
CARDIOLOGY OFFICE NOTE  Date:  03/20/2017    [name] Date of Birth: [birth year] Medical Record #[id number]  PCP:  [name], MD  Cardiologist:  [name]  Chief Complaint  Patient presents with  . Atrial Fibrillation  . Coronary Artery Disease    Work in visit - seen for Dr. [name]    History of Present Illness: [name] is a 66 y.o. male who presents today for a work in visit. Seen for Dr. [name].   He has a history of CAD, DM, HLD, paroxysmal AF, chronic amiodarone therapy, hypertension, diabetes, and chronic anticoagulation therapy. He has had prior throat cancer - in remission.   Last seen here back in June - had probably had some AF back in April. Overall he was felt to be doing ok.   Phone call earlier today - "I am having some leg swelling, left leg worse, and weeping sores, blisters. Then having to bandage. BP's have also been jumping up and down, having some 110/060/055 and feeling weak. Does not last as long as before before rising . Wondered did I need to see you or DR Little Thanks Donato Heinz "  Thus added to my schedule for today.   Comes in today. Here alone. Notes that he has not felt well for 2 months - off and on. Swelling over the past 2 months as well - used compression stockings with some relief but has had "bubbles" and weeping develop on the left leg. Swelling is worse on the left leg. HR typically runs in high 40's. He is not dizzy or lightheaded. He feels better with a higher BP - like in the 170's. But has had lower readings - down to 110 - this makes him feel bad. Weight not really different. Not really short of breath but no stamina over the last 11 years. Denies chest pain.  He has had chronic tendency towards orthostasis. His swelling seems to be the most pressing issue today. He says his blood sugars are "ok". A1C less than 7. He does not "eat regular food like regular people" due to his prior cancer. Most of his foods are pureed.   No teeth. He spends most of his day sitting with his wife - she is in SNF due to [place].   Past Medical History:  Diagnosis Date  . CAD (coronary artery disease)    70% LCx stenosis, treated medically  . Diabetes mellitus   . GERD (gastroesophageal reflux disease)   . Hypertension   . Hypotension   . Left atrial enlargement   . Persistent atrial fibrillation ([place])   . Sleep apnea   . tonsillar ca dx'd 02/2006   xrt/chemo comp 05/2006    Past Surgical History:  Procedure Laterality Date  . BREAST SURGERY    . CARDIAC CATHETERIZATION    . CARDIOVERSION N/A 10/14/2015   Procedure: CARDIOVERSION;  Surgeon: Lelon Perla, MD;  Location[facility] ENDOSCOPY;  Service: Cardiovascular;  Laterality: N/A;  . COLONOSCOPY WITH PROPOFOL N/A 06/03/2015   Procedure: COLONOSCOPY WITH PROPOFOL;  Surgeon: Laurence Spates, MD;  Location: [facility] ENDOSCOPY;  Service: Endoscopy;  Laterality: N/A;  . HOT HEMOSTASIS N/A 06/03/2015   Procedure: HOT HEMOSTASIS (ARGON PLASMA COAGULATION/BICAP);  Surgeon: Laurence Spates, MD;  Location: [facility] ENDOSCOPY;  Service: Endoscopy;  Laterality: N/A;     Medications: Current Meds  Medication Sig  . amiodarone (PACERONE) 200 MG tablet Take 1/2 tablet (100 mg) by mouth daily  . cholestyramine Lucrezia Starch)  4 GM/DOSE powder Take 1 g by mouth 2 (two) times daily with a meal.   . dicyclomine (BENTYL) 20 MG tablet Take 20 mg by mouth 3 (three) times daily before meals.  . docusate sodium (COLACE) 100 MG capsule Take 100 mg by mouth 2 (two) times daily as needed. For stool softener.  . hydrochlorothiazide (HYDRODIURIL) 25 MG tablet Take 25 mg by mouth daily.  Marland Kitchen levothyroxine (SYNTHROID, LEVOTHROID) 100 MCG tablet Take 100 mcg by mouth daily before breakfast.  . liothyronine (CYTOMEL) 5 MCG tablet Take 5 mcg by mouth daily.  . metFORMIN (GLUCOPHAGE) 1000 MG tablet Take 1,000 mg by mouth 2 (two) times daily with a meal.  . metoprolol succinate (TOPROL-XL) 25 MG 24 hr tablet TAKE ONE TABLET BY  MOUTH ONCE DAILY  . Multiple Vitamin (MULITIVITAMIN WITH MINERALS) TABS Take 1 tablet by mouth daily.  . Omega-3 Fatty Acids (FISH OIL PO) Take 2 capsules by mouth daily.  . pravastatin (PRAVACHOL) 80 MG tablet Take 80 mg by mouth at bedtime.   . quinapril (ACCUPRIL) 40 MG tablet Take 20-40 mg by mouth 2 (two) times daily. Take 1 tablet in AM, and 1/2 tablet in evening  . ranitidine (ZANTAC) 150 MG tablet Take 150 mg by mouth daily.  Marland Kitchen spironolactone (ALDACTONE) 25 MG tablet TAKE 1 TABLET BY MOUTH ONCE DAILY  . warfarin (COUMADIN) 5 MG tablet TAKE AS DIRECTED BY ANTICOAGULATION     Allergies: Allergies  Allergen Reactions  . Nsaids Other (See Comments)    REACTION: PER MD REQUEST NOT TO TAKE    Social History: The patient  reports that he has never smoked. He has never used smokeless tobacco. He reports that he does not drink alcohol or use drugs.   Family History: The patient's family history includes Coronary artery disease in his father.   Review of Systems: Please see the history of present illness.   Otherwise, the review of systems is positive for none.   All other systems are reviewed and negative.   Physical Exam: VS:  BP (!) 180/80 (BP Location: Left Arm, Patient Position: Sitting, Cuff Size: Normal)   Pulse (!) 49   Ht 6\' 4"  (1.93 m)   Wt 232 lb 6.4 oz (105.4 kg)   BMI 28.29 kg/m  .  BMI Body mass index is 28.29 kg/m.  Wt Readings from Last 3 Encounters:  03/20/17 232 lb 6.4 oz (105.4 kg)  12/08/16 228 lb 1.9 oz (103.5 kg)  06/12/16 232 lb 3.2 oz (105.3 kg)   BP by me is 160/80 sitting and then 120/50 standing.   General: Pleasant. Well developed, well nourished and in no acute distress.   HEENT: Normal - no teeth.   Neck: Supple, no JVD, carotid bruits, or masses noted.  Cardiac: Regular rate and rhythm. No murmurs, rubs, or gallops. + edema. Left leg red and hot - broken area of skin noted. He has +2 distal palpable pulses. Yellowish drainage.  Respiratory:   Lungs are clear to auscultation bilaterally with normal work of breathing.  GI: Soft and nontender.  MS: No deformity or atrophy. Gait and ROM intact.  Skin: Warm and dry. Color is normal.  Neuro:  Strength and sensation are intact and no gross focal deficits noted.  Psych: Alert, appropriate and with normal affect.      LABORATORY DATA:  EKG:  EKG is ordered today. This demonstrates sinus brady - HR is 49  Lab Results  Component Value Date   WBC 6.3  10/07/2015   HGB 15.2 10/07/2015   HCT 45.4 10/07/2015   PLT 182 10/07/2015   GLUCOSE 106 (H) 12/01/2015   ALT 28 11/17/2015   AST 26 11/17/2015   NA 139 12/01/2015   K 5.2 12/01/2015   CL 106 12/01/2015   CREATININE 1.57 (H) 12/01/2015   BUN 20 12/01/2015   CO2 21 12/01/2015   TSH 0.82 11/17/2015   INR 2.3 03/02/2017      BNP (last 3 results) No results for input(s): BNP in the last 8760 hours.  ProBNP (last 3 results) No results for input(s): PROBNP in the last 8760 hours.   Other Studies Reviewed Today:  Echo Study Conclusions 07/2015  - Left ventricle: The cavity size was normal. There was moderate   concentric hypertrophy. Systolic function was normal. The   estimated ejection fraction was in the range of 50% to 55%. Wall   motion was normal; there were no regional wall motion   abnormalities. - Left atrium: The atrium was mildly to moderately dilated. - Impressions: Abnormal GLS -12. Anbormality in basal and mid   myocardium.  Impressions:  - Abnormal GLS -12. Anbormality in basal and mid myocardium.   Assessment/Plan:  1. Swelling - he looks to have cellulitis to me - will treat with a week of cellulitis - will use Keflex suspension. Hopefully he will be able to swallow without issue. Hold on echo for now. He is to let us know if he fails to improve. Lab today.   2. PAF - he remains in sinus brady - no changes made today.   3. CAD - no active chest pain.   4. HTN - looks like he has had  chronic issues with orthostasis. Seems unchanged to me.   5. HLD  6. Chronic anticoagulation - no problems noted.   Current medicines are reviewed with the patient today.  The patient does not have concerns regarding medicines other than what has been noted above.  The following changes have been made:  See above.  Labs/ tests ordered today include:    Orders Placed This Encounter  Procedures  . Basic metabolic panel  . CBC  . Pro b natriuretic peptide (BNP)  . EKG 12-Lead     Disposition:   FU with Dr. [name] as planned.   Patient is agreeable to this plan and will call if any problems develop in the interim.   SignedTruitt Merle, NP  03/20/2017 2:40 PM  [facility] [address] [place], [place]  [address] Phone: [phone] Fax: [phone]

## 2017-03-20 NOTE — Telephone Encounter (Signed)
Patient's MyChart message to Dr. [name]:  I am having some leg swelling, left leg worse, and weeping sores, blisters. Then having to bandage. BP's have also been jumping up and down, having some 110/060/055 and feeling weak. Does not last as long as before before rising .  Wondered did I need to see you or DR Little  Thanks  [name]

## 2017-03-20 NOTE — Patient Instructions (Addendum)
We will be checking the following labs today - BMET, CBC, BNP   Medication Instructions:    Continue with your current medicines. BUT  I am adding Keflex 500 mg suspension to take 4 times a day for one week. This has been sent to your drug store.     Testing/Procedures To Be Arranged:  We may consider repeat echo if you fail to improve.   Follow-Up:   See Dr. [name] in about 4 to 6 weeks.     Other Special Instructions:   Look for a dressing called "Tegaderm"  Once the "bubble" stops draining - keep clean and open to air  Let us know how your leg is doing over the next few days.     If you need a refill on your cardiac medications before your next appointment, please call your pharmacy.   Call the [place] office at [phone] if you have any questions, problems or concerns.

## 2017-03-20 NOTE — Telephone Encounter (Signed)
-----   Message from Belva Crome, MD sent at 03/19/2017  6:22 PM EDT ----- Regarding: Swelling I think he needs to see an APP on my team  this week. The following is my message to the patient on My Chart: "Probably need to come in to be seen if having swelling. I am in hospital all this week. I will have my nurse call with appointment to see an assistant, get blood work and vital sign, make sure no atrial fib and then can decide what to do."

## 2017-03-20 NOTE — Telephone Encounter (Signed)
Spoke with patient and made him aware of Dr. Thompson Caul recommendations. Patient scheduled to come in today and see Truitt Merle, NP at 2:00 PM.

## 2017-03-21 ENCOUNTER — Encounter: Payer: Self-pay | Admitting: Podiatry

## 2017-03-21 ENCOUNTER — Other Ambulatory Visit: Payer: Self-pay | Admitting: *Deleted

## 2017-03-21 ENCOUNTER — Ambulatory Visit (INDEPENDENT_AMBULATORY_CARE_PROVIDER_SITE_OTHER): Payer: Medicare Other | Admitting: Podiatry

## 2017-03-21 DIAGNOSIS — M79674 Pain in right toe(s): Secondary | ICD-10-CM

## 2017-03-21 DIAGNOSIS — B351 Tinea unguium: Secondary | ICD-10-CM | POA: Diagnosis not present

## 2017-03-21 DIAGNOSIS — E875 Hyperkalemia: Secondary | ICD-10-CM

## 2017-03-21 DIAGNOSIS — M79675 Pain in left toe(s): Secondary | ICD-10-CM

## 2017-03-21 LAB — CBC
Hematocrit: 41.8 % (ref 37.5–51.0)
Hemoglobin: 13.6 g/dL (ref 13.0–17.7)
MCH: 30.1 pg (ref 26.6–33.0)
MCHC: 32.5 g/dL (ref 31.5–35.7)
MCV: 93 fL (ref 79–97)
Platelets: 207 10*3/uL (ref 150–379)
RBC: 4.52 x10E6/uL (ref 4.14–5.80)
RDW: 13.5 % (ref 12.3–15.4)
WBC: 8.6 10*3/uL (ref 3.4–10.8)

## 2017-03-21 LAB — BASIC METABOLIC PANEL
BUN/Creatinine Ratio: 12 (ref 10–24)
BUN: 19 mg/dL (ref 8–27)
CO2: 21 mmol/L (ref 20–29)
Calcium: 9.1 mg/dL (ref 8.6–10.2)
Chloride: 104 mmol/L (ref 96–106)
Creatinine, Ser: 1.53 mg/dL — ABNORMAL HIGH (ref 0.76–1.27)
GFR calc Af Amer: 54 mL/min/{1.73_m2} — ABNORMAL LOW (ref >59)
GFR calc non Af Amer: 47 mL/min/{1.73_m2} — ABNORMAL LOW (ref >59)
Glucose: 76 mg/dL (ref 65–99)
Potassium: 6.1 mmol/L — ABNORMAL HIGH (ref 3.5–5.2)
Sodium: 139 mmol/L (ref 134–144)

## 2017-03-21 LAB — PRO B NATRIURETIC PEPTIDE: NT-Pro BNP: 314 pg/mL (ref 0–376)

## 2017-03-21 NOTE — Progress Notes (Signed)
Complaint:  Visit Type: Patient returns to my office for continued preventative foot care services. Complaint: Patient states" my nails have grown long and thick and become painful to walk and wear shoes" Patient has been diagnosed with DM with no foot complications. The patient presents for preventative foot care services. No changes to ROS  Podiatric Exam: Vascular: dorsalis pedis and posterior tibial pulses are palpable bilateral. Capillary return is immediate. Temperature gradient is WNL. Skin turgor WNL  Sensorium: Normal Semmes Weinstein monofilament test. Normal tactile sensation bilaterally. Nail Exam: Pt has thick disfigured discolored nails with subungual debris noted bilateral entire nail hallux through fifth toenails Ulcer Exam: There is no evidence of ulcer or pre-ulcerative changes or infection. Orthopedic Exam: Muscle tone and strength are WNL. No limitations in general ROM. No crepitus or effusions noted. Foot type and digits show no abnormalities. Bony prominences are unremarkable. MTA  B/L.  Hallux malleus right hallux.  Hammer toes 2-5  B/L Skin: No Porokeratosis. No infection or ulcers  Diagnosis:  Onychomycosis, , Pain in right toe, pain in left toes  Treatment & Plan Procedures and Treatment: Consent by patient was obtained for treatment procedures.   Debridement of mycotic and hypertrophic toenails, 1 through 5 bilateral and clearing of subungual debris. No ulceration, no infection noted.  Return Visit-Office Procedure: Patient instructed to return to the office for a follow up visit 3 months for continued evaluation and treatment.    Gardiner Barefoot DPM

## 2017-03-26 ENCOUNTER — Other Ambulatory Visit: Payer: Medicare Other

## 2017-03-26 DIAGNOSIS — E875 Hyperkalemia: Secondary | ICD-10-CM

## 2017-03-26 LAB — BASIC METABOLIC PANEL
BUN/Creatinine Ratio: 13 (ref 10–24)
BUN: 20 mg/dL (ref 8–27)
CO2: 23 mmol/L (ref 20–29)
Calcium: 9.6 mg/dL (ref 8.6–10.2)
Chloride: 104 mmol/L (ref 96–106)
Creatinine, Ser: 1.49 mg/dL — ABNORMAL HIGH (ref 0.76–1.27)
GFR calc Af Amer: 56 mL/min/{1.73_m2} — ABNORMAL LOW (ref >59)
GFR calc non Af Amer: 49 mL/min/{1.73_m2} — ABNORMAL LOW (ref >59)
Glucose: 87 mg/dL (ref 65–99)
Potassium: 5.4 mmol/L — ABNORMAL HIGH (ref 3.5–5.2)
Sodium: 137 mmol/L (ref 134–144)

## 2017-04-13 ENCOUNTER — Ambulatory Visit (INDEPENDENT_AMBULATORY_CARE_PROVIDER_SITE_OTHER): Payer: Medicare Other | Admitting: Pharmacist

## 2017-04-13 DIAGNOSIS — I480 Paroxysmal atrial fibrillation: Secondary | ICD-10-CM

## 2017-04-13 DIAGNOSIS — Z5181 Encounter for therapeutic drug level monitoring: Secondary | ICD-10-CM

## 2017-04-13 DIAGNOSIS — R600 Localized edema: Secondary | ICD-10-CM | POA: Diagnosis not present

## 2017-04-13 DIAGNOSIS — L03116 Cellulitis of left lower limb: Secondary | ICD-10-CM | POA: Diagnosis not present

## 2017-04-13 LAB — POCT INR: INR: 2

## 2017-04-25 NOTE — Progress Notes (Signed)
Cardiology Office Note    Date:  04/26/2017   ID:  [name], DOB Oct 20, 1950, MRN [id number]  PCP:  [name], MD  Cardiologist: Sinclair Grooms, MD   Chief Complaint  Patient presents with  . Atrial Fibrillation    History of Present Illness:  [name] is a 66 y.o. male who presents for paroxysmal A. fib, hypertension, diabetes, and chronic anticoagulation therapy.    Recently seen by Truitt Merle September 25 with concern for left lower extremity cellulitis.  He has bilateral lower extremity edema.  He was in sinus rhythm when seen.  He denied shortness of breath.  He has not had any of his medications yesterday.  He feels tired today like he did not sleep well.  No chest pain, orthopnea, PND, and improvement in left lower extremity venous ulcer.   Past Medical History:  Diagnosis Date  . CAD (coronary artery disease)    70% LCx stenosis, treated medically  . Diabetes mellitus   . GERD (gastroesophageal reflux disease)   . Hypertension   . Hypotension   . Left atrial enlargement   . Persistent atrial fibrillation ([place])   . Sleep apnea   . tonsillar ca dx'd 02/2006   xrt/chemo comp 05/2006    Past Surgical History:  Procedure Laterality Date  . BREAST SURGERY    . CARDIAC CATHETERIZATION    . CARDIOVERSION N/A 10/14/2015   Procedure: CARDIOVERSION;  Surgeon: Lelon Perla, MD;  Location: [facility] ENDOSCOPY;  Service: Cardiovascular;  Laterality: N/A;  . COLONOSCOPY WITH PROPOFOL N/A 06/03/2015   Procedure: COLONOSCOPY WITH PROPOFOL;  Surgeon: Laurence Spates, MD;  Location: [facility] ENDOSCOPY;  Service: Endoscopy;  Laterality: N/A;  . HOT HEMOSTASIS N/A 06/03/2015   Procedure: HOT HEMOSTASIS (ARGON PLASMA COAGULATION/BICAP);  Surgeon: Laurence Spates, MD;  Location: [facility] ENDOSCOPY;  Service: Endoscopy;  Laterality: N/A;    Current Medications: Outpatient Medications Prior to Visit  Medication Sig Dispense Refill  . cephALEXin (KEFLEX) 500 MG capsule Take 1 capsule  (500 mg total) by mouth 4 (four) times daily. 28 capsule 0  . dicyclomine (BENTYL) 20 MG tablet Take 20 mg by mouth 3 (three) times daily before meals.    . docusate sodium (COLACE) 100 MG capsule Take 100 mg by mouth 2 (two) times daily as needed. For stool softener.    . hydrochlorothiazide (HYDRODIURIL) 25 MG tablet Take 25 mg by mouth daily.    Marland Kitchen levothyroxine (SYNTHROID, LEVOTHROID) 100 MCG tablet Take 100 mcg by mouth daily before breakfast.    . liothyronine (CYTOMEL) 5 MCG tablet Take 5 mcg by mouth daily.    . metFORMIN (GLUCOPHAGE) 1000 MG tablet Take 1,000 mg by mouth 2 (two) times daily with a meal.    . metoprolol succinate (TOPROL-XL) 25 MG 24 hr tablet TAKE ONE TABLET BY MOUTH ONCE DAILY 90 tablet 3  . Multiple Vitamin (MULITIVITAMIN WITH MINERALS) TABS Take 1 tablet by mouth daily.    . Omega-3 Fatty Acids (FISH OIL PO) Take 2 capsules by mouth daily.    . pravastatin (PRAVACHOL) 80 MG tablet Take 80 mg by mouth at bedtime.     . quinapril (ACCUPRIL) 40 MG tablet Take 20-40 mg by mouth 2 (two) times daily. Take 1 tablet in AM, and 1/2 tablet in evening    . ranitidine (ZANTAC) 150 MG tablet Take 150 mg by mouth daily.    Marland Kitchen warfarin (COUMADIN) 5 MG tablet TAKE AS DIRECTED BY ANTICOAGULATION 90 tablet 1  .  amiodarone (PACERONE) 200 MG tablet Take 1/2 tablet (100 mg) by mouth daily 45 tablet 1  . cholestyramine (QUESTRAN) 4 GM/DOSE powder Take 1 g by mouth 2 (two) times daily with a meal.      No facility-administered medications prior to visit.      Allergies:   Nsaids   Social History   Social History  . Marital status: Married    Spouse name: N/A  . Number of children: N/A  . Years of education: N/A   Social History Main Topics  . Smoking status: Never Smoker  . Smokeless tobacco: Never Used  . Alcohol use No  . Drug use: No  . Sexual activity: Not Asked   Other Topics Concern  . None   Social History Narrative  . None     Family History:  The patient's  family history includes Coronary artery disease in his father.   ROS:   Please see the history of present illness.    Appetite and taste are impaired.  Has no joy out of eating because of decreased taste.  He forces himself to eat.  Having difficulty with constipation, diarrhea, fatigue, easy bruising, rash, and chills.  No blood loss in stool. All other systems reviewed and are negative.   PHYSICAL EXAM:   VS:  BP (!) 146/84 (BP Location: Left Arm)   Pulse (!) 55   Ht 6\' 4"  (1.93 m)   Wt 231 lb 12.8 oz (105.1 kg)   BMI 28.22 kg/m    GEN: Well nourished, well developed, in no acute distress  HEENT: normal  Neck: no JVD, carotid bruits, or masses Cardiac: Rapid IIRR; no murmurs, rubs, or gallops.  Trace bilateral edema.  No significant ulcers.  Varicose veins noted. Respiratory:  clear to auscultation bilaterally, normal work of breathing GI: soft, nontender, nondistended, + BS MS: no deformity or atrophy  Skin: warm and dry, no rash Neuro:  Alert and Oriented x 3, Strength and sensation are intact Psych: euthymic mood, full affect  Wt Readings from Last 3 Encounters:  04/26/17 231 lb 12.8 oz (105.1 kg)  03/20/17 232 lb 6.4 oz (105.4 kg)  12/08/16 228 lb 1.9 oz (103.5 kg)      Studies/Labs Reviewed:   EKG:  EKG atrial fibrillation with poor rate control at rate of approximately 101 bpm.  This is new compared to the most recent tracing performed in September 2018.  Recent Labs: 03/20/2017: Hemoglobin 13.6; NT-Pro BNP 314; Platelets 207 03/26/2017: BUN 20; Creatinine, Ser 1.49; Potassium 5.4; Sodium 137   Lipid Panel No results found for: CHOL, TRIG, HDL, CHOLHDL, VLDL, LDLCALC, LDLDIRECT  Additional studies/ records that were reviewed today include:  No new data    ASSESSMENT:    1. Paroxysmal atrial fibrillation (HCC)   2. Essential hypertension   3. Lower extremity edema   4. Obstructive sleep apnea   5. Type 2 diabetes mellitus without complication, without  long-term current use of insulin (HCC)   6. Other hyperlipidemia   7. Long term current use of amiodarone      PLAN:  In order of problems listed above:  1. Recurrent atrial fibrillation, developing over the past month.  Duration is unknown.  Increase amiodarone to 200 mg twice daily.  2-week follow-up with APP.  If still in atrial fibrillation, will need to have electrical cardioversion set up. 2. Elevated today but he has not had his medications.  Advised him to take meds as soon as possible once he  returns home.   3. Unchanged. 4. Encouraged compliance with CPAP. 5. Not addressed. 6. Not addressed. 7. TSH and liver panel is obtained today.  Amiodarone dose was increased today.  Will notify Coumadin clinic.  May need to have INR done sooner rather than later.  Clinical follow-up with me in 6 months.  Follow-up in 2 weeks to determine if cardioversion will be necessary.  Call prior to then for shortness of breath and increased swelling.  Will alert anticoagulation clinic but amiodarone dose has been increased and Coumadin may need to be adjusted.  Medication Adjustments/Labs and Tests Ordered: Current medicines are reviewed at length with the patient today.  Concerns regarding medicines are outlined above.  Medication changes, Labs and Tests ordered today are listed in the Patient Instructions below. Patient Instructions  Medication Instructions:  1) INCREASE Amiodarone to 200 mg by mouth twice daily  Labwork: Your physician recommends that you get lab work today - TSH, CMET, and CBC   Testing/Procedures: None  Follow-Up: Your physician recommends that you schedule a follow-up appointment in 2 weeks with APP or AFIB Clinic.   Any Other Special Instructions Will Be Listed Below (If Applicable).  CALL us IF YOU HAVE SHORTNESS OF BREATH AND/OR SWELLING.  If you need a refill on your cardiac medications before your next appointment, please call your pharmacy.       Signed, Sinclair Grooms, MD  04/26/2017 10:47 AM    [facility] [place], [place], [place]  [address] Phone: [phone]; Fax: [phone]

## 2017-04-26 ENCOUNTER — Ambulatory Visit (INDEPENDENT_AMBULATORY_CARE_PROVIDER_SITE_OTHER): Payer: Medicare Other | Admitting: Interventional Cardiology

## 2017-04-26 ENCOUNTER — Encounter: Payer: Self-pay | Admitting: Interventional Cardiology

## 2017-04-26 VITALS — BP 146/84 | HR 55 | Ht 76.0 in | Wt 231.8 lb

## 2017-04-26 DIAGNOSIS — I480 Paroxysmal atrial fibrillation: Secondary | ICD-10-CM | POA: Diagnosis not present

## 2017-04-26 DIAGNOSIS — I10 Essential (primary) hypertension: Secondary | ICD-10-CM

## 2017-04-26 DIAGNOSIS — G4733 Obstructive sleep apnea (adult) (pediatric): Secondary | ICD-10-CM

## 2017-04-26 DIAGNOSIS — Z79899 Other long term (current) drug therapy: Secondary | ICD-10-CM | POA: Diagnosis not present

## 2017-04-26 DIAGNOSIS — R600 Localized edema: Secondary | ICD-10-CM

## 2017-04-26 DIAGNOSIS — E7849 Other hyperlipidemia: Secondary | ICD-10-CM

## 2017-04-26 DIAGNOSIS — E119 Type 2 diabetes mellitus without complications: Secondary | ICD-10-CM | POA: Diagnosis not present

## 2017-04-26 LAB — COMPREHENSIVE METABOLIC PANEL
ALBUMIN: 4.4 g/dL (ref 3.6–4.8)
ALK PHOS: 66 IU/L (ref 39–117)
ALT: 15 IU/L (ref 0–44)
AST: 20 IU/L (ref 0–40)
Albumin/Globulin Ratio: 1.8 (ref 1.2–2.2)
BUN/Creatinine Ratio: 10 (ref 10–24)
BUN: 14 mg/dL (ref 8–27)
Bilirubin Total: 0.4 mg/dL (ref 0.0–1.2)
CO2: 23 mmol/L (ref 20–29)
CREATININE: 1.36 mg/dL — AB (ref 0.76–1.27)
Calcium: 9.4 mg/dL (ref 8.6–10.2)
Chloride: 98 mmol/L (ref 96–106)
GFR calc Af Amer: 63 mL/min/{1.73_m2} (ref >59)
GFR calc non Af Amer: 54 mL/min/{1.73_m2} — ABNORMAL LOW (ref >59)
GLUCOSE: 127 mg/dL — AB (ref 65–99)
Globulin, Total: 2.5 g/dL (ref 1.5–4.5)
Potassium: 4.3 mmol/L (ref 3.5–5.2)
Sodium: 138 mmol/L (ref 134–144)
Total Protein: 6.9 g/dL (ref 6.0–8.5)

## 2017-04-26 LAB — CBC
HEMATOCRIT: 43.2 % (ref 37.5–51.0)
HEMOGLOBIN: 14.6 g/dL (ref 13.0–17.7)
MCH: 30.7 pg (ref 26.6–33.0)
MCHC: 33.8 g/dL (ref 31.5–35.7)
MCV: 91 fL (ref 79–97)
Platelets: 212 10*3/uL (ref 150–379)
RBC: 4.75 x10E6/uL (ref 4.14–5.80)
RDW: 13.5 % (ref 12.3–15.4)
WBC: 7.2 10*3/uL (ref 3.4–10.8)

## 2017-04-26 LAB — TSH: TSH: 0.938 u[IU]/mL (ref 0.450–4.500)

## 2017-04-26 MED ORDER — AMIODARONE HCL 200 MG PO TABS: 200.0000 mg | ORAL_TABLET | Freq: Every day | ORAL | 3 refills | Status: DC

## 2017-04-26 NOTE — Patient Instructions (Signed)
Medication Instructions:  1) INCREASE Amiodarone to 200 mg by mouth twice daily  Labwork: Your physician recommends that you get lab work today - TSH, CMET, and CBC   Testing/Procedures: None  Follow-Up: Your physician recommends that you schedule a follow-up appointment in 2 weeks with APP or AFIB Clinic.   Any Other Special Instructions Will Be Listed Below (If Applicable).  CALL us IF YOU HAVE SHORTNESS OF BREATH AND/OR SWELLING.  If you need a refill on your cardiac medications before your next appointment, please call your pharmacy.

## 2017-04-27 DIAGNOSIS — Z23 Encounter for immunization: Secondary | ICD-10-CM | POA: Diagnosis not present

## 2017-05-10 ENCOUNTER — Ambulatory Visit (HOSPITAL_COMMUNITY)
Admission: RE | Admit: 2017-05-10 | Discharge: 2017-05-10 | Disposition: A | Discharge status: Home / Self Care | Payer: Medicare Other | Source: Ambulatory Visit | Attending: Nurse Practitioner | Admitting: Nurse Practitioner

## 2017-05-10 ENCOUNTER — Encounter (HOSPITAL_COMMUNITY): Payer: Self-pay | Admitting: Nurse Practitioner

## 2017-05-10 VITALS — BP 162/76 | HR 51 | Ht 76.0 in | Wt 232.0 lb

## 2017-05-10 DIAGNOSIS — I2510 Atherosclerotic heart disease of native coronary artery without angina pectoris: Secondary | ICD-10-CM | POA: Insufficient documentation

## 2017-05-10 DIAGNOSIS — G4730 Sleep apnea, unspecified: Secondary | ICD-10-CM | POA: Insufficient documentation

## 2017-05-10 DIAGNOSIS — Z79899 Other long term (current) drug therapy: Secondary | ICD-10-CM | POA: Insufficient documentation

## 2017-05-10 DIAGNOSIS — Z85818 Personal history of malignant neoplasm of other sites of lip, oral cavity, and pharynx: Secondary | ICD-10-CM | POA: Insufficient documentation

## 2017-05-10 DIAGNOSIS — K219 Gastro-esophageal reflux disease without esophagitis: Secondary | ICD-10-CM | POA: Insufficient documentation

## 2017-05-10 DIAGNOSIS — E119 Type 2 diabetes mellitus without complications: Secondary | ICD-10-CM | POA: Insufficient documentation

## 2017-05-10 DIAGNOSIS — R001 Bradycardia, unspecified: Secondary | ICD-10-CM | POA: Insufficient documentation

## 2017-05-10 DIAGNOSIS — I481 Persistent atrial fibrillation: Secondary | ICD-10-CM | POA: Insufficient documentation

## 2017-05-10 DIAGNOSIS — Z7901 Long term (current) use of anticoagulants: Secondary | ICD-10-CM | POA: Diagnosis not present

## 2017-05-10 DIAGNOSIS — I4891 Unspecified atrial fibrillation: Secondary | ICD-10-CM | POA: Diagnosis present

## 2017-05-10 DIAGNOSIS — I480 Paroxysmal atrial fibrillation: Secondary | ICD-10-CM

## 2017-05-10 DIAGNOSIS — Z7984 Long term (current) use of oral hypoglycemic drugs: Secondary | ICD-10-CM | POA: Diagnosis not present

## 2017-05-10 DIAGNOSIS — I119 Hypertensive heart disease without heart failure: Secondary | ICD-10-CM | POA: Diagnosis not present

## 2017-05-10 LAB — PROTIME-INR
INR: 2.1
Prothrombin Time: 23.4 seconds — ABNORMAL HIGH (ref 11.4–15.2)

## 2017-05-10 NOTE — Patient Instructions (Signed)
Your physician has recommended you make the following change in your medication:  1)Decrease Amiodarone to 200mg  once a day on Monday, November 19th

## 2017-05-10 NOTE — Progress Notes (Signed)
Patient ID: [name], male   DOB: [birth year], 66 y.o.   MRN: [id number]    Primary Care Physician: [name], MD Referring Physician: Dr. [name] is a 66 y.o. male with a h/o afib that was first diagnosed 14 years ago. 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 Over the years afib has became persistent. He has been on amiodarone for over a year and has been enjoying sinus rhythm. He was seen by Dr. [name] 11/1, and found to be in afib. Amiodarone was increased to 200 mg bid with f/u here in 2 weeks. Fortunately, pt is in SR today. Feels well, no complaints. Has not had INR checked since amiodarone increased.  Today, he denies symptoms of palpitations, chest pain, shortness of breath, orthopnea, PND, lower extremity edema, dizziness, presyncope, syncope, or neurologic sequela. The patient is tolerating medications without difficulties and is otherwise without complaint today.   Past Medical History:  Diagnosis Date  . CAD (coronary artery disease)    70% LCx stenosis, treated medically  . Diabetes mellitus   . GERD (gastroesophageal reflux disease)   . Hypertension   . Hypotension   . Left atrial enlargement   . Persistent atrial fibrillation ([place])   . Sleep apnea   . tonsillar ca dx'd 02/2006   xrt/chemo comp 05/2006   Past Surgical History:  Procedure Laterality Date  . BREAST SURGERY    . CARDIAC CATHETERIZATION    . CARDIOVERSION N/A 10/14/2015   Procedure: CARDIOVERSION;  Surgeon: Lelon Perla, MD;  Location: [facility] ENDOSCOPY;  Service: Cardiovascular;  Laterality: N/A;  . COLONOSCOPY WITH PROPOFOL N/A 06/03/2015   Procedure: COLONOSCOPY WITH PROPOFOL;  Surgeon: Laurence Spates, MD;  Location: [facility] ENDOSCOPY;  Service: Endoscopy;  Laterality: N/A;  . HOT HEMOSTASIS N/A 06/03/2015   Procedure: HOT HEMOSTASIS (ARGON PLASMA COAGULATION/BICAP);  Surgeon: Laurence Spates, MD;  Location: [facility] ENDOSCOPY;  Service: Endoscopy;   Laterality: N/A;    Current Outpatient Medications  Medication Sig Dispense Refill  . amiodarone (PACERONE) 200 MG tablet Take 1 tablet (200 mg total) by mouth daily. (Patient taking differently: Take 200 mg 2 (two) times daily by mouth. ) 90 tablet 3  . dicyclomine (BENTYL) 20 MG tablet Take 20 mg by mouth 3 (three) times daily before meals.    . docusate sodium (COLACE) 100 MG capsule Take 100 mg by mouth 2 (two) times daily as needed. For stool softener.    . hydrochlorothiazide (HYDRODIURIL) 25 MG tablet Take 25 mg by mouth daily.    Marland Kitchen levothyroxine (SYNTHROID, LEVOTHROID) 100 MCG tablet Take 100 mcg by mouth daily before breakfast.    . liothyronine (CYTOMEL) 5 MCG tablet Take 5 mcg by mouth daily.    . metFORMIN (GLUCOPHAGE) 1000 MG tablet Take 1,000 mg by mouth 2 (two) times daily with a meal.    . metoprolol succinate (TOPROL-XL) 25 MG 24 hr tablet TAKE ONE TABLET BY MOUTH ONCE DAILY 90 tablet 3  . Multiple Vitamin (MULITIVITAMIN WITH MINERALS) TABS Take 1 tablet by mouth daily.    . Omega-3 Fatty Acids (FISH OIL PO) Take 2 capsules by mouth daily.    . pravastatin (PRAVACHOL) 80 MG tablet Take 80 mg by mouth at bedtime.     . quinapril (ACCUPRIL) 40 MG tablet Take 20-40 mg by mouth 2 (two) times daily. Take 1 tablet in AM, and 1/2 tablet in evening    . ranitidine (ZANTAC)  150 MG tablet Take 150 mg by mouth daily.    Marland Kitchen warfarin (COUMADIN) 5 MG tablet TAKE AS DIRECTED BY ANTICOAGULATION 90 tablet 1  . cephALEXin (KEFLEX) 500 MG capsule Take 1 capsule (500 mg total) by mouth 4 (four) times daily. (Patient not taking: Reported on 05/10/2017) 28 capsule 0   No current facility-administered medications for this encounter.     Allergies  Allergen Reactions  . Nsaids Other (See Comments)    REACTION: PER MD REQUEST NOT TO TAKE    Social History   Socioeconomic History  . Marital status: Married    Spouse name: Not on file  . Number of children: Not on file  . Years of education:  Not on file  . Highest education level: Not on file  Social Needs  . Financial resource strain: Not on file  . Food insecurity - worry: Not on file  . Food insecurity - inability: Not on file  . Transportation needs - medical: Not on file  . Transportation needs - non-medical: Not on file  Occupational History  . Not on file  Tobacco Use  . Smoking status: Never Smoker  . Smokeless tobacco: Never Used  Substance and Sexual Activity  . Alcohol use: No    Alcohol/week: 0.0 oz  . Drug use: No  . Sexual activity: Not on file  Other Topics Concern  . Not on file  Social History Narrative  . Not on file    Family History  Problem Relation Age of Onset  . Coronary artery disease Father     ROS- All systems are reviewed and negative except as per the HPI above  Physical Exam: Vitals:   05/10/17 1440  BP: (!) 162/76  Pulse: (!) 51  Weight: 232 lb (105.2 kg)  Height: 6\' 4"  (1.93 m)    GEN- The patient is well appearing, alert and oriented x 3 today.   Head- normocephalic, atraumatic Eyes-  Sclera clear, conjunctiva pink Ears- hearing intact Oropharynx- clear Neck- supple, no JVP Lymph- no cervical lymphadenopathy Lungs- Clear to ausculation bilaterally, normal work of breathing Heart-  regular rate and rhythm, no murmurs, rubs or gallops, PMI not laterally displaced GI- soft, NT, ND, + BS Extremities- no clubbing, cyanosis, trace edema MS- no significant deformity or atrophy Skin- no rash or lesion Psych- euthymic mood, full affect Neuro- strength and sensation are intact  EKG- Sinus brady at 444 ms Epic records reviewed  Assessment and Plan:  1. afib Has reverted to SR since increase of  amiodarone 200 mg bid  Drop back to amiodarone 200 mg  one a day staring 11/19 Continue warfarin, INR done today and will forward results to coumadin clinc  2. HTN  Elevated, states that is his normal  F/u with Dr. [name] in 6 months per recall afib clinic as  needed  Geroge Baseman. Nalani Andreen, [facility] [address] [place], [place] [address] [phone]

## 2017-05-11 DIAGNOSIS — E875 Hyperkalemia: Secondary | ICD-10-CM | POA: Diagnosis not present

## 2017-05-11 DIAGNOSIS — Z6829 Body mass index (BMI) 29.0-29.9, adult: Secondary | ICD-10-CM | POA: Diagnosis not present

## 2017-05-11 DIAGNOSIS — N183 Chronic kidney disease, stage 3 (moderate): Secondary | ICD-10-CM | POA: Diagnosis not present

## 2017-05-11 DIAGNOSIS — E1122 Type 2 diabetes mellitus with diabetic chronic kidney disease: Secondary | ICD-10-CM | POA: Diagnosis not present

## 2017-05-11 DIAGNOSIS — I129 Hypertensive chronic kidney disease with stage 1 through stage 4 chronic kidney disease, or unspecified chronic kidney disease: Secondary | ICD-10-CM | POA: Diagnosis not present

## 2017-05-11 DIAGNOSIS — I480 Paroxysmal atrial fibrillation: Secondary | ICD-10-CM | POA: Diagnosis not present

## 2017-05-22 DIAGNOSIS — C4442 Squamous cell carcinoma of skin of scalp and neck: Secondary | ICD-10-CM | POA: Diagnosis not present

## 2017-05-22 DIAGNOSIS — L570 Actinic keratosis: Secondary | ICD-10-CM | POA: Diagnosis not present

## 2017-05-22 DIAGNOSIS — D485 Neoplasm of uncertain behavior of skin: Secondary | ICD-10-CM | POA: Diagnosis not present

## 2017-05-22 DIAGNOSIS — Z23 Encounter for immunization: Secondary | ICD-10-CM | POA: Diagnosis not present

## 2017-05-25 ENCOUNTER — Ambulatory Visit (INDEPENDENT_AMBULATORY_CARE_PROVIDER_SITE_OTHER): Payer: Medicare Other | Admitting: *Deleted

## 2017-05-25 DIAGNOSIS — Z5181 Encounter for therapeutic drug level monitoring: Secondary | ICD-10-CM | POA: Diagnosis not present

## 2017-05-25 DIAGNOSIS — I480 Paroxysmal atrial fibrillation: Secondary | ICD-10-CM

## 2017-05-25 LAB — POCT INR: INR: 3.2

## 2017-05-25 NOTE — Patient Instructions (Signed)
Hold today's dose then continue taking 1 tablet daily except 1/2 tablet on Mondays and Fridays. Recheck in 3 weeks. Call if scheduled for any procedures, surgery or medication changes at [phone]

## 2017-06-15 ENCOUNTER — Ambulatory Visit (INDEPENDENT_AMBULATORY_CARE_PROVIDER_SITE_OTHER): Payer: Medicare Other | Admitting: *Deleted

## 2017-06-15 DIAGNOSIS — Z5181 Encounter for therapeutic drug level monitoring: Secondary | ICD-10-CM | POA: Diagnosis not present

## 2017-06-15 DIAGNOSIS — I480 Paroxysmal atrial fibrillation: Secondary | ICD-10-CM | POA: Diagnosis not present

## 2017-06-15 LAB — POCT INR: INR: 3.9

## 2017-06-15 NOTE — Patient Instructions (Signed)
Description   Hold today's dose then start taking 1 tablet daily except 1/2 tablet on Mondays, Wednesdays, and Fridays. Recheck in 2 weeks. Call if scheduled for any procedures, surgery or medication changes at [phone]

## 2017-06-20 ENCOUNTER — Encounter: Payer: Self-pay | Admitting: Podiatry

## 2017-06-20 ENCOUNTER — Ambulatory Visit (INDEPENDENT_AMBULATORY_CARE_PROVIDER_SITE_OTHER): Payer: Medicare Other | Admitting: Podiatry

## 2017-06-20 DIAGNOSIS — M79674 Pain in right toe(s): Secondary | ICD-10-CM | POA: Diagnosis not present

## 2017-06-20 DIAGNOSIS — M79675 Pain in left toe(s): Secondary | ICD-10-CM

## 2017-06-20 DIAGNOSIS — B351 Tinea unguium: Secondary | ICD-10-CM

## 2017-06-20 DIAGNOSIS — D689 Coagulation defect, unspecified: Secondary | ICD-10-CM

## 2017-06-20 DIAGNOSIS — E1142 Type 2 diabetes mellitus with diabetic polyneuropathy: Secondary | ICD-10-CM

## 2017-06-20 NOTE — Progress Notes (Signed)
Complaint:  Visit Type: Patient returns to my office for continued preventative foot care services. Complaint: Patient states" my nails have grown long and thick and become painful to walk and wear shoes" Patient has been diagnosed with DM with no foot complications. The patient presents for preventative foot care services. No changes to ROS  Podiatric Exam: Vascular: dorsalis pedis and posterior tibial pulses are palpable bilateral. Capillary return is immediate. Temperature gradient is WNL. Skin turgor WNL  Sensorium: Normal Semmes Weinstein monofilament test. Normal tactile sensation bilaterally. Nail Exam: Pt has thick disfigured discolored nails with subungual debris noted bilateral entire nail hallux through fifth toenails Ulcer Exam: There is no evidence of ulcer or pre-ulcerative changes or infection. Orthopedic Exam: Muscle tone and strength are WNL. No limitations in general ROM. No crepitus or effusions noted. Foot type and digits show no abnormalities. Bony prominences are unremarkable. MTA  B/L.  Hallux malleus right hallux.  Hammer toes 2-5  B/L Skin: No Porokeratosis. No infection or ulcers  Diagnosis:  Onychomycosis, , Pain in right toe, pain in left toes  Treatment & Plan Procedures and Treatment: Consent by patient was obtained for treatment procedures.   Debridement of mycotic and hypertrophic toenails, 1 through 5 bilateral and clearing of subungual debris. No ulceration, no infection noted.  Return Visit-Office Procedure: Patient instructed to return to the office for a follow up visit 3 months for continued evaluation and treatment.    Gardiner Barefoot DPM

## 2017-06-28 DIAGNOSIS — Z125 Encounter for screening for malignant neoplasm of prostate: Secondary | ICD-10-CM | POA: Diagnosis not present

## 2017-06-28 DIAGNOSIS — E118 Type 2 diabetes mellitus with unspecified complications: Secondary | ICD-10-CM | POA: Diagnosis not present

## 2017-06-28 DIAGNOSIS — G4730 Sleep apnea, unspecified: Secondary | ICD-10-CM | POA: Diagnosis not present

## 2017-06-28 DIAGNOSIS — Z86010 Personal history of colonic polyps: Secondary | ICD-10-CM | POA: Diagnosis not present

## 2017-06-28 DIAGNOSIS — I10 Essential (primary) hypertension: Secondary | ICD-10-CM | POA: Diagnosis not present

## 2017-06-28 DIAGNOSIS — Z0000 Encounter for general adult medical examination without abnormal findings: Secondary | ICD-10-CM | POA: Diagnosis not present

## 2017-06-28 DIAGNOSIS — N189 Chronic kidney disease, unspecified: Secondary | ICD-10-CM | POA: Diagnosis not present

## 2017-06-28 DIAGNOSIS — E039 Hypothyroidism, unspecified: Secondary | ICD-10-CM | POA: Diagnosis not present

## 2017-06-28 DIAGNOSIS — I4891 Unspecified atrial fibrillation: Secondary | ICD-10-CM | POA: Diagnosis not present

## 2017-06-28 DIAGNOSIS — Z23 Encounter for immunization: Secondary | ICD-10-CM | POA: Diagnosis not present

## 2017-06-28 DIAGNOSIS — E782 Mixed hyperlipidemia: Secondary | ICD-10-CM | POA: Diagnosis not present

## 2017-06-28 DIAGNOSIS — Z7984 Long term (current) use of oral hypoglycemic drugs: Secondary | ICD-10-CM | POA: Diagnosis not present

## 2017-06-29 ENCOUNTER — Ambulatory Visit (INDEPENDENT_AMBULATORY_CARE_PROVIDER_SITE_OTHER): Payer: Medicare Other | Admitting: Pharmacist

## 2017-06-29 DIAGNOSIS — Z5181 Encounter for therapeutic drug level monitoring: Secondary | ICD-10-CM | POA: Diagnosis not present

## 2017-06-29 DIAGNOSIS — I480 Paroxysmal atrial fibrillation: Secondary | ICD-10-CM

## 2017-06-29 LAB — POCT INR: INR: 2.5

## 2017-06-29 NOTE — Patient Instructions (Signed)
Description   Continue 1 tablet daily except 1/2 tablet on Mondays, Wednesdays, and Fridays. Recheck in 3 weeks. Call if scheduled for any procedures, surgery or medication changes at [phone]

## 2017-07-11 DIAGNOSIS — L905 Scar conditions and fibrosis of skin: Secondary | ICD-10-CM | POA: Diagnosis not present

## 2017-07-11 DIAGNOSIS — C4442 Squamous cell carcinoma of skin of scalp and neck: Secondary | ICD-10-CM | POA: Diagnosis not present

## 2017-07-20 ENCOUNTER — Ambulatory Visit (INDEPENDENT_AMBULATORY_CARE_PROVIDER_SITE_OTHER): Payer: Medicare Other | Admitting: *Deleted

## 2017-07-20 DIAGNOSIS — I480 Paroxysmal atrial fibrillation: Secondary | ICD-10-CM | POA: Diagnosis not present

## 2017-07-20 DIAGNOSIS — Z5181 Encounter for therapeutic drug level monitoring: Secondary | ICD-10-CM | POA: Diagnosis not present

## 2017-07-20 LAB — POCT INR: INR: 2.6

## 2017-07-20 NOTE — Patient Instructions (Signed)
Description   Continue taking 1 tablet daily except 1/2 tablet on Mondays, Wednesdays, and Fridays. Recheck in 4 weeks. Call if scheduled for any procedures, surgery or medication changes at [phone]

## 2017-07-24 DIAGNOSIS — H35039 Hypertensive retinopathy, unspecified eye: Secondary | ICD-10-CM | POA: Diagnosis not present

## 2017-07-24 DIAGNOSIS — H2513 Age-related nuclear cataract, bilateral: Secondary | ICD-10-CM | POA: Diagnosis not present

## 2017-07-24 DIAGNOSIS — E113393 Type 2 diabetes mellitus with moderate nonproliferative diabetic retinopathy without macular edema, bilateral: Secondary | ICD-10-CM | POA: Diagnosis not present

## 2017-08-14 ENCOUNTER — Other Ambulatory Visit: Payer: Self-pay | Admitting: *Deleted

## 2017-08-14 MED ORDER — WARFARIN SODIUM 5 MG PO TABS: ORAL_TABLET | ORAL | 1 refills | Status: DC

## 2017-08-24 ENCOUNTER — Ambulatory Visit (INDEPENDENT_AMBULATORY_CARE_PROVIDER_SITE_OTHER): Payer: Medicare Other | Admitting: Pharmacist

## 2017-08-24 DIAGNOSIS — Z5181 Encounter for therapeutic drug level monitoring: Secondary | ICD-10-CM | POA: Diagnosis not present

## 2017-08-24 DIAGNOSIS — I480 Paroxysmal atrial fibrillation: Secondary | ICD-10-CM

## 2017-08-24 LAB — POCT INR: INR: 1.7

## 2017-08-24 NOTE — Patient Instructions (Signed)
Description   Take 1 tablet today then continue taking 1 tablet daily except 1/2 tablet on Mondays, Wednesdays, and Fridays. Recheck in 3 weeks. Call if scheduled for any procedures, surgery or medication changes at [phone]

## 2017-09-12 DIAGNOSIS — L821 Other seborrheic keratosis: Secondary | ICD-10-CM | POA: Diagnosis not present

## 2017-09-12 DIAGNOSIS — Z23 Encounter for immunization: Secondary | ICD-10-CM | POA: Diagnosis not present

## 2017-09-12 DIAGNOSIS — L219 Seborrheic dermatitis, unspecified: Secondary | ICD-10-CM | POA: Diagnosis not present

## 2017-09-12 DIAGNOSIS — D225 Melanocytic nevi of trunk: Secondary | ICD-10-CM | POA: Diagnosis not present

## 2017-09-12 DIAGNOSIS — L570 Actinic keratosis: Secondary | ICD-10-CM | POA: Diagnosis not present

## 2017-09-14 ENCOUNTER — Ambulatory Visit (INDEPENDENT_AMBULATORY_CARE_PROVIDER_SITE_OTHER): Payer: Medicare Other | Admitting: *Deleted

## 2017-09-14 DIAGNOSIS — I480 Paroxysmal atrial fibrillation: Secondary | ICD-10-CM

## 2017-09-14 DIAGNOSIS — Z5181 Encounter for therapeutic drug level monitoring: Secondary | ICD-10-CM

## 2017-09-14 LAB — POCT INR: INR: 1.5

## 2017-09-14 NOTE — Patient Instructions (Signed)
Description   Take 1 tablet today, tomorrow take 1.5 tablets, then start taking 1 tablet daily except 1/2 tablet on Mondays and Fridays. Recheck in 12 days. Call if scheduled for any procedures, surgery or medication changes at [phone]

## 2017-09-18 ENCOUNTER — Encounter: Payer: Self-pay | Admitting: Podiatry

## 2017-09-18 ENCOUNTER — Ambulatory Visit (INDEPENDENT_AMBULATORY_CARE_PROVIDER_SITE_OTHER): Payer: Medicare Other | Admitting: Podiatry

## 2017-09-18 DIAGNOSIS — M79674 Pain in right toe(s): Secondary | ICD-10-CM | POA: Diagnosis not present

## 2017-09-18 DIAGNOSIS — D689 Coagulation defect, unspecified: Secondary | ICD-10-CM

## 2017-09-18 DIAGNOSIS — B351 Tinea unguium: Secondary | ICD-10-CM

## 2017-09-18 DIAGNOSIS — E1142 Type 2 diabetes mellitus with diabetic polyneuropathy: Secondary | ICD-10-CM

## 2017-09-18 DIAGNOSIS — M79675 Pain in left toe(s): Secondary | ICD-10-CM | POA: Diagnosis not present

## 2017-09-18 NOTE — Progress Notes (Signed)
Complaint:  Visit Type: Patient returns to my office for continued preventative foot care services. Complaint: Patient states" my nails have grown long and thick and become painful to walk and wear shoes" Patient has been diagnosed with DM with no foot complications. The patient presents for preventative foot care services. No changes to ROS  Podiatric Exam: Vascular: dorsalis pedis and posterior tibial pulses are palpable bilateral. Capillary return is immediate. Temperature gradient is WNL. Skin turgor WNL  Sensorium: Normal Semmes Weinstein monofilament test. Normal tactile sensation bilaterally. Nail Exam: Pt has thick disfigured discolored nails with subungual debris noted bilateral entire nail hallux through fifth toenails Ulcer Exam: There is no evidence of ulcer or pre-ulcerative changes or infection. Orthopedic Exam: Muscle tone and strength are WNL. No limitations in general ROM. No crepitus or effusions noted. Foot type and digits show no abnormalities. Bony prominences are unremarkable. MTA  B/L.  Hallux malleus right hallux.  Hammer toes 2-5  B/L Skin: No Porokeratosis. No infection or ulcers  Diagnosis:  Onychomycosis, , Pain in right toe, pain in left toes  Treatment & Plan Procedures and Treatment: Consent by patient was obtained for treatment procedures.   Debridement of mycotic and hypertrophic toenails, 1 through 5 bilateral and clearing of subungual debris. No ulceration, no infection noted.  Return Visit-Office Procedure: Patient instructed to return to the office for a follow up visit 3 months for continued evaluation and treatment.    Gardiner Barefoot DPM

## 2017-09-26 ENCOUNTER — Ambulatory Visit (INDEPENDENT_AMBULATORY_CARE_PROVIDER_SITE_OTHER): Payer: Medicare Other | Admitting: *Deleted

## 2017-09-26 DIAGNOSIS — I480 Paroxysmal atrial fibrillation: Secondary | ICD-10-CM

## 2017-09-26 DIAGNOSIS — Z5181 Encounter for therapeutic drug level monitoring: Secondary | ICD-10-CM | POA: Diagnosis not present

## 2017-09-26 LAB — POCT INR: INR: 1.5

## 2017-09-26 NOTE — Patient Instructions (Signed)
Description   Today April 3rd take 1 and 1/2 tablets then tomorrow April 4th take 1 and 1/2 tablets then change coumadin dose to 1 tablet daily except 1/2 tablet only on Fridays. Recheck in  2 weeks. Call if scheduled for any procedures, surgery or medication changes at [phone]  Cut back on green intake

## 2017-10-10 ENCOUNTER — Ambulatory Visit (INDEPENDENT_AMBULATORY_CARE_PROVIDER_SITE_OTHER): Payer: Medicare Other | Admitting: *Deleted

## 2017-10-10 DIAGNOSIS — Z5181 Encounter for therapeutic drug level monitoring: Secondary | ICD-10-CM

## 2017-10-10 DIAGNOSIS — I480 Paroxysmal atrial fibrillation: Secondary | ICD-10-CM

## 2017-10-10 LAB — POCT INR: INR: 1.8

## 2017-10-10 NOTE — Patient Instructions (Signed)
Description   Today April 17th  take 1 and 1/2 tablets then do dose change as instructed on last visit 1 tablet daily except 1/2 tablet only on Fridays. Recheck in  2 weeks. Call if scheduled for any procedures, surgery or medication changes at [phone]  Cut back on green intake

## 2017-10-24 ENCOUNTER — Ambulatory Visit (INDEPENDENT_AMBULATORY_CARE_PROVIDER_SITE_OTHER): Payer: Medicare Other | Admitting: Pharmacist

## 2017-10-24 DIAGNOSIS — I480 Paroxysmal atrial fibrillation: Secondary | ICD-10-CM

## 2017-10-24 DIAGNOSIS — Z5181 Encounter for therapeutic drug level monitoring: Secondary | ICD-10-CM

## 2017-10-24 LAB — POCT INR: INR: 1.7

## 2017-10-24 NOTE — Patient Instructions (Signed)
Description   Take 1.5 tablets today then change dose to 1 tablet every daily. Recheck in 2 weeks. Call if scheduled for any procedures, surgery or medication changes at [phone]

## 2017-11-07 ENCOUNTER — Ambulatory Visit (INDEPENDENT_AMBULATORY_CARE_PROVIDER_SITE_OTHER): Payer: Medicare Other | Admitting: *Deleted

## 2017-11-07 DIAGNOSIS — I480 Paroxysmal atrial fibrillation: Secondary | ICD-10-CM | POA: Diagnosis not present

## 2017-11-07 DIAGNOSIS — Z5181 Encounter for therapeutic drug level monitoring: Secondary | ICD-10-CM

## 2017-11-07 LAB — POCT INR: INR: 2.4

## 2017-11-07 NOTE — Patient Instructions (Addendum)
Description   Continue taking 1 tablet every daily. Recheck in 3 weeks. Call if scheduled for any procedures, surgery or medication changes at [phone]

## 2017-11-20 ENCOUNTER — Encounter (HOSPITAL_COMMUNITY): Payer: Self-pay | Admitting: Nurse Practitioner

## 2017-11-20 ENCOUNTER — Telehealth: Payer: Self-pay | Admitting: Internal Medicine

## 2017-11-20 ENCOUNTER — Ambulatory Visit (HOSPITAL_COMMUNITY)
Admission: RE | Admit: 2017-11-20 | Discharge: 2017-11-20 | Disposition: A | Discharge status: Home / Self Care | Payer: Medicare Other | Source: Ambulatory Visit | Attending: Nurse Practitioner | Admitting: Nurse Practitioner

## 2017-11-20 ENCOUNTER — Telehealth: Payer: Self-pay | Admitting: *Deleted

## 2017-11-20 VITALS — BP 130/80 | HR 83 | Ht 76.0 in | Wt 234.2 lb

## 2017-11-20 DIAGNOSIS — Z923 Personal history of irradiation: Secondary | ICD-10-CM | POA: Insufficient documentation

## 2017-11-20 DIAGNOSIS — K219 Gastro-esophageal reflux disease without esophagitis: Secondary | ICD-10-CM | POA: Diagnosis not present

## 2017-11-20 DIAGNOSIS — I480 Paroxysmal atrial fibrillation: Secondary | ICD-10-CM

## 2017-11-20 DIAGNOSIS — I481 Persistent atrial fibrillation: Secondary | ICD-10-CM | POA: Diagnosis not present

## 2017-11-20 DIAGNOSIS — Z85818 Personal history of malignant neoplasm of other sites of lip, oral cavity, and pharynx: Secondary | ICD-10-CM | POA: Diagnosis not present

## 2017-11-20 DIAGNOSIS — I119 Hypertensive heart disease without heart failure: Secondary | ICD-10-CM | POA: Insufficient documentation

## 2017-11-20 DIAGNOSIS — E119 Type 2 diabetes mellitus without complications: Secondary | ICD-10-CM | POA: Insufficient documentation

## 2017-11-20 DIAGNOSIS — Z7901 Long term (current) use of anticoagulants: Secondary | ICD-10-CM | POA: Diagnosis not present

## 2017-11-20 DIAGNOSIS — I2510 Atherosclerotic heart disease of native coronary artery without angina pectoris: Secondary | ICD-10-CM | POA: Insufficient documentation

## 2017-11-20 DIAGNOSIS — Z79899 Other long term (current) drug therapy: Secondary | ICD-10-CM | POA: Insufficient documentation

## 2017-11-20 DIAGNOSIS — G4730 Sleep apnea, unspecified: Secondary | ICD-10-CM | POA: Insufficient documentation

## 2017-11-20 DIAGNOSIS — Z9221 Personal history of antineoplastic chemotherapy: Secondary | ICD-10-CM | POA: Insufficient documentation

## 2017-11-20 LAB — COMPREHENSIVE METABOLIC PANEL
ALBUMIN: 4.1 g/dL (ref 3.5–5.0)
ALT: 16 U/L — ABNORMAL LOW (ref 17–63)
AST: 20 U/L (ref 15–41)
Alkaline Phosphatase: 32 U/L — ABNORMAL LOW (ref 38–126)
Anion gap: 8 (ref 5–15)
BUN: 37 mg/dL — AB (ref 6–20)
CHLORIDE: 107 mmol/L (ref 101–111)
CO2: 26 mmol/L (ref 22–32)
CREATININE: 1.95 mg/dL — AB (ref 0.61–1.24)
Calcium: 9.3 mg/dL (ref 8.9–10.3)
GFR calc Af Amer: 39 mL/min — ABNORMAL LOW (ref >60)
GFR, EST NON AFRICAN AMERICAN: 34 mL/min — AB (ref >60)
Glucose, Bld: 126 mg/dL — ABNORMAL HIGH (ref 65–99)
POTASSIUM: 4.8 mmol/L (ref 3.5–5.1)
SODIUM: 141 mmol/L (ref 135–145)
Total Bilirubin: 0.6 mg/dL (ref 0.3–1.2)
Total Protein: 7 g/dL (ref 6.5–8.1)

## 2017-11-20 LAB — CBC WITH DIFFERENTIAL/PLATELET
Abs Immature Granulocytes: 0 10*3/uL (ref 0.0–0.1)
BASOS ABS: 0 10*3/uL (ref 0.0–0.1)
BASOS PCT: 1 %
Eosinophils Absolute: 0.3 10*3/uL (ref 0.0–0.7)
Eosinophils Relative: 4 %
HCT: 42.9 % (ref 39.0–52.0)
Hemoglobin: 14 g/dL (ref 13.0–17.0)
IMMATURE GRANULOCYTES: 0 %
Lymphocytes Relative: 13 %
Lymphs Abs: 0.9 10*3/uL (ref 0.7–4.0)
MCH: 30 pg (ref 26.0–34.0)
MCHC: 32.6 g/dL (ref 30.0–36.0)
MCV: 91.9 fL (ref 78.0–100.0)
MONOS PCT: 11 %
Monocytes Absolute: 0.8 10*3/uL (ref 0.1–1.0)
NEUTROS ABS: 5.2 10*3/uL (ref 1.7–7.7)
NEUTROS PCT: 71 %
PLATELETS: 182 10*3/uL (ref 150–400)
RBC: 4.67 MIL/uL (ref 4.22–5.81)
RDW: 13.8 % (ref 11.5–15.5)
WBC: 7.3 10*3/uL (ref 4.0–10.5)

## 2017-11-20 LAB — PROTIME-INR
INR: 1.59
Prothrombin Time: 18.8 seconds — ABNORMAL HIGH (ref 11.4–15.2)

## 2017-11-20 LAB — TSH: TSH: 2.608 u[IU]/mL (ref 0.350–4.500)

## 2017-11-20 MED ORDER — AMIODARONE HCL 200 MG PO TABS: 200.0000 mg | ORAL_TABLET | Freq: Two times a day (BID) | ORAL | 3 refills | Status: DC

## 2017-11-20 NOTE — Telephone Encounter (Signed)
New Message   Patient c/o Palpitations:  High priority if patient c/o lightheadedness, shortness of breath, or chest pain  1) How long have you had palpitations/irregular HR/ Afib? Are you having the symptoms now? Since Sunday 5/26  2) Are you currently experiencing lightheadedness, SOB or CP? no  3) Do you have a history of afib (atrial fibrillation) or irregular heart rhythm? yes  4) Have you checked your BP or HR? (document readings if available): pulse 80-100 and 180-150 down to 100/102  5) Are you experiencing any other symptoms? Blood pressures going up and down

## 2017-11-20 NOTE — Telephone Encounter (Signed)
Received message from Afib Clinic and called pt to schedule for an appt per their message. Pt is pending DCCV and needs for weekly therapeutic INR's. Appt set with pt over the phone on 11/22/17 at 1020am & pt verbalized understanding.

## 2017-11-20 NOTE — Telephone Encounter (Signed)
-----   Message from Iona Hansen, [place] sent at 11/20/2017  3:47 PM EDT ----- Regarding: therapeutic INRs Pt was seen in afib clinic today and will need 4 therapeutic weekly INRs.  Also, Butch Penny increased his amiodarone to 200 mg bid short term and he will follow up with [place] on Monday to possible sched for dccv.  Please schedule pt for INRs and FYI on the amio.  Thank you  Estill Bamberg

## 2017-11-20 NOTE — Progress Notes (Signed)
Patient ID: [name], male   DOB: 1950/09/05, 67 y.o.   MRN: [id number]    Primary Care Physician: [name], MD Referring Physician: Dr. [name] is a 67 y.o. male with a h/o afib that was first diagnosed 14 years ago. 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  He was loaded on amiodarone in the remote past and has been staying in SR. Last cardioversion was 09/2015 and has been in rhythm since then. He is in the afib clinic today for returning to afib on Sunday.  He currently is on warfarin for chadsvasc score of at least 2(htn, DM ).   He had a positive  sleep study and is on CPAP but has not used for the most of this year for "sinuses."  He is on warfarin but his INR's, have been more out of range than in range. As of 5/1, he was 1.7 Does not use alcohol, or tobacco products.  Echo performed 07/28/15 revealed normal systolic function, with moderate concentric hypertrophy, normal wall motion, with Left atrium 50 mm.   Today, he denies symptoms of palpitations, chest pain, shortness of breath, orthopnea, PND, lower extremity edema, dizziness, presyncope, syncope, or neurologic sequela. Positive  The patient is tolerating medications without difficulties and is otherwise without complaint today.   Past Medical History:  Diagnosis Date  . CAD (coronary artery disease)    70% LCx stenosis, treated medically  . Diabetes mellitus   . GERD (gastroesophageal reflux disease)   . Hypertension   . Hypotension   . Left atrial enlargement   . Persistent atrial fibrillation ([place])   . Sleep apnea   . tonsillar ca dx'd 02/2006   xrt/chemo comp 05/2006   Past Surgical History:  Procedure Laterality Date  . BREAST SURGERY    . CARDIAC CATHETERIZATION    . CARDIOVERSION N/A 10/14/2015   Procedure: CARDIOVERSION;  Surgeon: Lelon Perla, MD;  Location: [facility] ENDOSCOPY;  Service: Cardiovascular;  Laterality: N/A;  . COLONOSCOPY  WITH PROPOFOL N/A 06/03/2015   Procedure: COLONOSCOPY WITH PROPOFOL;  Surgeon: Laurence Spates, MD;  Location: [facility] ENDOSCOPY;  Service: Endoscopy;  Laterality: N/A;  . HOT HEMOSTASIS N/A 06/03/2015   Procedure: HOT HEMOSTASIS (ARGON PLASMA COAGULATION/BICAP);  Surgeon: Laurence Spates, MD;  Location: [facility] ENDOSCOPY;  Service: Endoscopy;  Laterality: N/A;    Current Outpatient Medications  Medication Sig Dispense Refill  . amiodarone (PACERONE) 200 MG tablet Take 1 tablet (200 mg total) by mouth daily. 90 tablet 3  . dicyclomine (BENTYL) 20 MG tablet Take 20 mg by mouth 3 (three) times daily before meals.    . docusate sodium (COLACE) 100 MG capsule Take 100 mg by mouth 2 (two) times daily as needed. For stool softener.    . doxazosin (CARDURA) 2 MG tablet Take 2 mg by mouth daily.    . fluorouracil (EFUDEX) 5 % cream     . hydrochlorothiazide (HYDRODIURIL) 25 MG tablet Take 25 mg by mouth daily.    Marland Kitchen levothyroxine (SYNTHROID, LEVOTHROID) 100 MCG tablet Take 100 mcg by mouth daily before breakfast.    . liothyronine (CYTOMEL) 5 MCG tablet Take 5 mcg by mouth daily.    . metFORMIN (GLUCOPHAGE) 1000 MG tablet Take 1,000 mg by mouth 2 (two) times daily with a meal.    . metoprolol succinate (TOPROL-XL) 25 MG 24 hr tablet TAKE ONE TABLET BY MOUTH ONCE DAILY 90 tablet 3  .  Multiple Vitamin (MULITIVITAMIN WITH MINERALS) TABS Take 1 tablet by mouth daily.    . Omega-3 Fatty Acids (FISH OIL PO) Take 2 capsules by mouth daily.    . pravastatin (PRAVACHOL) 80 MG tablet Take 80 mg by mouth at bedtime.     . quinapril (ACCUPRIL) 40 MG tablet Take 20-40 mg by mouth 2 (two) times daily. Take 1 tablet in AM, and 1/2 tablet in evening    . ranitidine (ZANTAC) 150 MG tablet Take 150 mg by mouth daily.    Marland Kitchen warfarin (COUMADIN) 5 MG tablet TAKE AS DIRECTED BY ANTICOAGULATION 90 tablet 1  . cephALEXin (KEFLEX) 500 MG capsule Take 1 capsule (500 mg total) by mouth 4 (four) times daily. (Patient not taking: Reported on  05/10/2017) 28 capsule 0   No current facility-administered medications for this encounter.     Allergies  Allergen Reactions  . Nsaids Other (See Comments)    REACTION: PER MD REQUEST NOT TO TAKE    Social History   Socioeconomic History  . Marital status: Married    Spouse name: Not on file  . Number of children: Not on file  . Years of education: Not on file  . Highest education level: Not on file  Occupational History  . Not on file  Social Needs  . Financial resource strain: Not on file  . Food insecurity:    Worry: Not on file    Inability: Not on file  . Transportation needs:    Medical: Not on file    Non-medical: Not on file  Tobacco Use  . Smoking status: Never Smoker  . Smokeless tobacco: Never Used  Substance and Sexual Activity  . Alcohol use: No    Alcohol/week: 0.0 oz  . Drug use: No  . Sexual activity: Not on file  Lifestyle  . Physical activity:    Days per week: Not on file    Minutes per session: Not on file  . Stress: Not on file  Relationships  . Social connections:    Talks on phone: Not on file    Gets together: Not on file    Attends religious service: Not on file    Active member of club or organization: Not on file    Attends meetings of clubs or organizations: Not on file    Relationship status: Not on file  . Intimate partner violence:    Fear of current or ex partner: Not on file    Emotionally abused: Not on file    Physically abused: Not on file    Forced sexual activity: Not on file  Other Topics Concern  . Not on file  Social History Narrative  . Not on file    Family History  Problem Relation Age of Onset  . Coronary artery disease Father     ROS- All systems are reviewed and negative except as per the HPI above  Physical Exam: Vitals:   11/20/17 1523  BP: 130/80  Pulse: 83  Weight: 234 lb 3.2 oz (106.2 kg)  Height: 6\' 4"  (1.93 m)    GEN- The patient is well appearing, alert and oriented x 3 today.   Head-  normocephalic, atraumatic Eyes-  Sclera clear, conjunctiva pink Ears- hearing intact Oropharynx- clear Neck- supple, no JVP Lymph- no cervical lymphadenopathy Lungs- Clear to ausculation bilaterally, normal work of breathing Heart- Irregular rate and rhythm, no murmurs, rubs or gallops, PMI not laterally displaced GI- soft, NT, ND, + BS Extremities- no clubbing, cyanosis,  trace edema MS- no significant deformity or atrophy Skin- no rash or lesion Psych- euthymic mood, full affect Neuro- strength and sensation are intact  EKG- afib with v rate 83 bpm, LAD, qrs int 94 ms, qtc 446 ms Epic records reviewed  Assessment and Plan:  1. Symptomatic persistent Afib Has been staying in SR with amiodarone Recent breakthrough to afib on Sunday Sleep study positive and has Cpap, but has not been using No known triggers Encouraged to start back use of CPAP Continue metoprolol Increase amiodarone 200 mg   to bid  Will f/u on Monday and if still in afib will  plan on cardioversion but will need 4 consecutive INR's as his INR's has been mostly in the subtherapeutic range I am hesitate to order aTEE as he has had throat cancer and radiation in  the past  and has pills / food that gets hung  intermittently  Continue warfarin INR, TSH, Cmet, CBC today  2. HTN  Stable  F/u here on Monday 6/3   Butch Penny C. Vanice Rappa, [facility] [address] [place], [place] [address] [phone]

## 2017-11-20 NOTE — Patient Instructions (Signed)
Increase Amiodarone to 200 mg twice a day  Will need 4 therapeutic INRs for possible cardioversion

## 2017-11-20 NOTE — Telephone Encounter (Signed)
Returned call to Pt.  Per Pt he thinks he went out of rhythm on Sunday and hasn't felt like he has gone "back in" to rhythm yet.  Pt does not feel well.  Pt has been seen previously by the afib clinic.  Advised Pt to call and see if he can make an appointment for today or tomorrow.  Phone number given. Pt will call afib clinic.

## 2017-11-21 ENCOUNTER — Telehealth: Payer: Self-pay | Admitting: Interventional Cardiology

## 2017-11-21 ENCOUNTER — Encounter (HOSPITAL_COMMUNITY): Payer: Self-pay | Admitting: *Deleted

## 2017-11-21 ENCOUNTER — Encounter (HOSPITAL_COMMUNITY): Payer: Self-pay | Admitting: Nurse Practitioner

## 2017-11-21 ENCOUNTER — Ambulatory Visit (INDEPENDENT_AMBULATORY_CARE_PROVIDER_SITE_OTHER): Payer: Medicare Other | Admitting: Pharmacist

## 2017-11-21 DIAGNOSIS — I480 Paroxysmal atrial fibrillation: Secondary | ICD-10-CM | POA: Diagnosis not present

## 2017-11-21 DIAGNOSIS — Z5181 Encounter for therapeutic drug level monitoring: Secondary | ICD-10-CM | POA: Diagnosis not present

## 2017-11-21 NOTE — Telephone Encounter (Signed)
Pt came into office today with questions about Amiodarone.  Pt confused about dosage and whether the increase yesterday was the correct thing to do.  Reviewed Afib Clinic note and advised pt to take Amio 200 bid as instructed by Afib Clinic and f/u Monday, 6/3 as scheduled. Pt verbalized understanding and was appreciative for call.

## 2017-11-26 ENCOUNTER — Encounter (HOSPITAL_COMMUNITY): Payer: Self-pay | Admitting: Nurse Practitioner

## 2017-11-26 ENCOUNTER — Ambulatory Visit (HOSPITAL_COMMUNITY)
Admission: RE | Admit: 2017-11-26 | Discharge: 2017-11-26 | Disposition: A | Discharge status: Home / Self Care | Payer: Medicare Other | Source: Ambulatory Visit | Attending: Nurse Practitioner | Admitting: Nurse Practitioner

## 2017-11-26 VITALS — BP 198/90 | HR 57 | Ht 76.0 in | Wt 232.0 lb

## 2017-11-26 DIAGNOSIS — Z79899 Other long term (current) drug therapy: Secondary | ICD-10-CM | POA: Diagnosis not present

## 2017-11-26 DIAGNOSIS — I2510 Atherosclerotic heart disease of native coronary artery without angina pectoris: Secondary | ICD-10-CM | POA: Insufficient documentation

## 2017-11-26 DIAGNOSIS — E119 Type 2 diabetes mellitus without complications: Secondary | ICD-10-CM | POA: Insufficient documentation

## 2017-11-26 DIAGNOSIS — Z8249 Family history of ischemic heart disease and other diseases of the circulatory system: Secondary | ICD-10-CM | POA: Insufficient documentation

## 2017-11-26 DIAGNOSIS — Z79890 Hormone replacement therapy (postmenopausal): Secondary | ICD-10-CM | POA: Insufficient documentation

## 2017-11-26 DIAGNOSIS — Z923 Personal history of irradiation: Secondary | ICD-10-CM | POA: Diagnosis not present

## 2017-11-26 DIAGNOSIS — K219 Gastro-esophageal reflux disease without esophagitis: Secondary | ICD-10-CM | POA: Diagnosis not present

## 2017-11-26 DIAGNOSIS — I480 Paroxysmal atrial fibrillation: Secondary | ICD-10-CM | POA: Diagnosis not present

## 2017-11-26 DIAGNOSIS — Z7984 Long term (current) use of oral hypoglycemic drugs: Secondary | ICD-10-CM | POA: Insufficient documentation

## 2017-11-26 DIAGNOSIS — Z886 Allergy status to analgesic agent status: Secondary | ICD-10-CM | POA: Diagnosis not present

## 2017-11-26 DIAGNOSIS — Z9889 Other specified postprocedural states: Secondary | ICD-10-CM | POA: Diagnosis not present

## 2017-11-26 DIAGNOSIS — Z9221 Personal history of antineoplastic chemotherapy: Secondary | ICD-10-CM | POA: Diagnosis not present

## 2017-11-26 DIAGNOSIS — Z85818 Personal history of malignant neoplasm of other sites of lip, oral cavity, and pharynx: Secondary | ICD-10-CM | POA: Insufficient documentation

## 2017-11-26 DIAGNOSIS — Z7901 Long term (current) use of anticoagulants: Secondary | ICD-10-CM | POA: Diagnosis not present

## 2017-11-26 DIAGNOSIS — G4730 Sleep apnea, unspecified: Secondary | ICD-10-CM | POA: Insufficient documentation

## 2017-11-26 DIAGNOSIS — I481 Persistent atrial fibrillation: Secondary | ICD-10-CM | POA: Insufficient documentation

## 2017-11-26 DIAGNOSIS — I119 Hypertensive heart disease without heart failure: Secondary | ICD-10-CM | POA: Insufficient documentation

## 2017-11-26 LAB — BASIC METABOLIC PANEL
Anion gap: 9 (ref 5–15)
BUN: 26 mg/dL — AB (ref 6–20)
CALCIUM: 9.4 mg/dL (ref 8.9–10.3)
CHLORIDE: 103 mmol/L (ref 101–111)
CO2: 27 mmol/L (ref 22–32)
CREATININE: 1.81 mg/dL — AB (ref 0.61–1.24)
GFR calc Af Amer: 43 mL/min — ABNORMAL LOW (ref >60)
GFR calc non Af Amer: 37 mL/min — ABNORMAL LOW (ref >60)
GLUCOSE: 109 mg/dL — AB (ref 65–99)
Potassium: 4.2 mmol/L (ref 3.5–5.1)
Sodium: 139 mmol/L (ref 135–145)

## 2017-11-26 MED ORDER — AMIODARONE HCL 200 MG PO TABS: ORAL_TABLET | ORAL | 3 refills | Status: DC

## 2017-11-26 NOTE — Patient Instructions (Signed)
Continue Amiodarone at twice a day until Friday then reduce to 200mg  once a day --  Discuss with Dr. [name] in follow up about returning to 100mg  a day  Follow up with primary physician in regards to elevated blood pressure.

## 2017-11-26 NOTE — Progress Notes (Signed)
Patient ID: [name], male   DOB: 05-Apr-1951, 67 y.o.   MRN: [id number]    Primary Care Physician: [name], MD Referring Physician: Dr. [name] is a 66 y.o. male with a h/o afib that was first diagnosed 14 years ago. 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  He was loaded on amiodarone in the remote past and has been staying in SR. Last cardioversion was 09/2015 and has been in rhythm since then. He is in the afib clinic today for returning to afib on Sunday.  He currently is on warfarin for chadsvasc score of at least 2(htn, DM ).   He had a positive  sleep study and is on CPAP but has not used for the most of this year for "sinuses."  He is on warfarin but his INR's, have been more out of range than in range. As of 5/1, he was 1.7 Does not use alcohol, or tobacco products.  Echo performed 07/28/15 revealed normal systolic function, with moderate concentric hypertrophy, normal wall motion, with Left atrium 50 mm.   F/u ina fib clinic, 6/3. Pt has returned to SR with increase of amiodarone. For some reason, his BP has been running higher recently. His PCP follows this.  Today, he denies symptoms of palpitations, chest pain, shortness of breath, orthopnea, PND, lower extremity edema, dizziness, presyncope, syncope, or neurologic sequela. Positive  The patient is tolerating medications without difficulties and is otherwise without complaint today.   Past Medical History:  Diagnosis Date  . CAD (coronary artery disease)    70% LCx stenosis, treated medically  . Diabetes mellitus   . GERD (gastroesophageal reflux disease)   . Hypertension   . Hypotension   . Left atrial enlargement   . Persistent atrial fibrillation ([place])   . Sleep apnea   . tonsillar ca dx'd 02/2006   xrt/chemo comp 05/2006   Past Surgical History:  Procedure Laterality Date  . BREAST SURGERY    . CARDIAC CATHETERIZATION    . CARDIOVERSION N/A  10/14/2015   Procedure: CARDIOVERSION;  Surgeon: Lelon Perla, MD;  Location: [facility] ENDOSCOPY;  Service: Cardiovascular;  Laterality: N/A;  . COLONOSCOPY WITH PROPOFOL N/A 06/03/2015   Procedure: COLONOSCOPY WITH PROPOFOL;  Surgeon: Laurence Spates, MD;  Location: [facility] ENDOSCOPY;  Service: Endoscopy;  Laterality: N/A;  . HOT HEMOSTASIS N/A 06/03/2015   Procedure: HOT HEMOSTASIS (ARGON PLASMA COAGULATION/BICAP);  Surgeon: Laurence Spates, MD;  Location: [facility] ENDOSCOPY;  Service: Endoscopy;  Laterality: N/A;    Current Outpatient Medications  Medication Sig Dispense Refill  . amiodarone (PACERONE) 200 MG tablet Take 200mg  twice a day until 6/7 then reduce to 200mg  once a day 90 tablet 3  . dicyclomine (BENTYL) 20 MG tablet Take 20 mg by mouth 3 (three) times daily before meals.    . docusate sodium (COLACE) 100 MG capsule Take 100 mg by mouth 2 (two) times daily as needed. For stool softener.    . doxazosin (CARDURA) 2 MG tablet Take 2 mg by mouth daily.    . fluorouracil (EFUDEX) 5 % cream     . hydrochlorothiazide (HYDRODIURIL) 25 MG tablet Take 25 mg by mouth daily.    Marland Kitchen levothyroxine (SYNTHROID, LEVOTHROID) 100 MCG tablet Take 100 mcg by mouth daily before breakfast.    . liothyronine (CYTOMEL) 5 MCG tablet Take 5 mcg by mouth daily.    . metFORMIN (GLUCOPHAGE) 1000 MG tablet  Take 1,000 mg by mouth 2 (two) times daily with a meal.    . metoprolol succinate (TOPROL-XL) 25 MG 24 hr tablet TAKE ONE TABLET BY MOUTH ONCE DAILY 90 tablet 3  . Multiple Vitamin (MULITIVITAMIN WITH MINERALS) TABS Take 1 tablet by mouth daily.    . Omega-3 Fatty Acids (FISH OIL PO) Take 2 capsules by mouth daily.    . pravastatin (PRAVACHOL) 80 MG tablet Take 80 mg by mouth at bedtime.     . quinapril (ACCUPRIL) 40 MG tablet Take 20-40 mg by mouth 2 (two) times daily. Take 1 tablet in AM, and 1/2 tablet in evening    . ranitidine (ZANTAC) 150 MG tablet Take 150 mg by mouth daily.    Marland Kitchen warfarin (COUMADIN) 5 MG tablet TAKE AS  DIRECTED BY ANTICOAGULATION 90 tablet 1   No current facility-administered medications for this encounter.     Allergies  Allergen Reactions  . Nsaids Other (See Comments)    REACTION: PER MD REQUEST NOT TO TAKE    Social History   Socioeconomic History  . Marital status: Married    Spouse name: Not on file  . Number of children: Not on file  . Years of education: Not on file  . Highest education level: Not on file  Occupational History  . Not on file  Social Needs  . Financial resource strain: Not on file  . Food insecurity:    Worry: Not on file    Inability: Not on file  . Transportation needs:    Medical: Not on file    Non-medical: Not on file  Tobacco Use  . Smoking status: Never Smoker  . Smokeless tobacco: Never Used  Substance and Sexual Activity  . Alcohol use: No    Alcohol/week: 0.0 oz  . Drug use: No  . Sexual activity: Not on file  Lifestyle  . Physical activity:    Days per week: Not on file    Minutes per session: Not on file  . Stress: Not on file  Relationships  . Social connections:    Talks on phone: Not on file    Gets together: Not on file    Attends religious service: Not on file    Active member of club or organization: Not on file    Attends meetings of clubs or organizations: Not on file    Relationship status: Not on file  . Intimate partner violence:    Fear of current or ex partner: Not on file    Emotionally abused: Not on file    Physically abused: Not on file    Forced sexual activity: Not on file  Other Topics Concern  . Not on file  Social History Narrative  . Not on file    Family History  Problem Relation Age of Onset  . Coronary artery disease Father     ROS- All systems are reviewed and negative except as per the HPI above  Physical Exam: Vitals:   11/26/17 1520  BP: (!) 198/90  Pulse: (!) 57  Weight: 232 lb (105.2 kg)  Height: 6\' 4"  (1.93 m)    GEN- The patient is well appearing, alert and oriented x 3  today.   Head- normocephalic, atraumatic Eyes-  Sclera clear, conjunctiva pink Ears- hearing intact Oropharynx- clear Neck- supple, no JVP Lymph- no cervical lymphadenopathy Lungs- Clear to ausculation bilaterally, normal work of breathing Heart- regular rate and rhythm, no murmurs, rubs or gallops, PMI not laterally displaced GI- soft,  NT, ND, + BS Extremities- no clubbing, cyanosis, trace edema MS- no significant deformity or atrophy Skin- no rash or lesion Psych- euthymic mood, full affect Neuro- strength and sensation are intact  EKG-  Sinus brady at 57 bpm, pr int 21 ms, qrs int 94 ms, qtc 445 ms Epic records reviewed  Assessment and Plan:  1. Symptomatic persistent Afib Has been staying in SR with amiodarone Recent breakthrough to afib one week ago Sunday Amiodarone was increased to 200 mg bid and has returned to SR Keep amiodarone at current dose thru Friday and then decrease to  200 mg a day Can discuss on f/u with Dr. [name] in July, returning to 100 mg daily Sleep study positive and has Cpap, but has not been using, encouraged use No known triggers Continue metoprolol  His INR's has been mostly in the subtherapeutic range, is being followed closely with coumadin clinic Repeat bmet today as last bmet showed elevated creatinine and bun  2. HTN  Elevated over the last few days F/u with PCP as he treats BP Avoid salt  afib clinic as needed   Butch Penny C. Salisa Broz, [facility] [address] [place], [place] [address] [phone]

## 2017-11-27 ENCOUNTER — Encounter (HOSPITAL_COMMUNITY): Payer: Self-pay | Admitting: *Deleted

## 2017-11-28 ENCOUNTER — Telehealth: Payer: Self-pay | Admitting: Interventional Cardiology

## 2017-11-28 ENCOUNTER — Ambulatory Visit (INDEPENDENT_AMBULATORY_CARE_PROVIDER_SITE_OTHER): Payer: Medicare Other | Admitting: *Deleted

## 2017-11-28 DIAGNOSIS — Z5181 Encounter for therapeutic drug level monitoring: Secondary | ICD-10-CM

## 2017-11-28 DIAGNOSIS — I480 Paroxysmal atrial fibrillation: Secondary | ICD-10-CM | POA: Diagnosis not present

## 2017-11-28 LAB — POCT INR: INR: 2.7 (ref 2.0–3.0)

## 2017-11-28 NOTE — Patient Instructions (Addendum)
Description   Continue taking 1 tablet every daily. Recheck in 2 weeks. Remain consistent with leafy green veggies.  Call if scheduled for any procedures, surgery or medication changes at [phone]

## 2017-11-28 NOTE — Telephone Encounter (Signed)
Pt seen in Afib Clinic 6/3 and Afib had resolved.  Currently on Amio 200 BID until Friday and then will decrease to 200mg  QD.  Pt currently scheduled to see Dr. [name] 7/18.  Pt not having any issues.  Pt wanted to know if he needed to be seen sooner?  Advised I would send message to Dr. [name] and have him review Afib Clinic note and see if pt should come in sooner.

## 2017-11-28 NOTE — Telephone Encounter (Signed)
New message      Pt was seen in coumadin clinic today and stopped by my desk to ask me to have Dr Thompson Caul nurse call him.  He is scheduled to see Dr [name] in July and has been released from [place] [facility].  However, he thinks he may need to see Dr [name] sooner.  Not having any specific symptoms but want to talk to the nurse.  Please call.

## 2017-11-29 NOTE — Telephone Encounter (Signed)
July okay unless having trouble.

## 2017-11-30 ENCOUNTER — Telehealth: Payer: Self-pay | Admitting: *Deleted

## 2017-11-30 NOTE — Telephone Encounter (Signed)
-----   Message from Juluis Mire, RN sent at 11/26/2017  3:44 PM EDT ----- Regarding: amio/inr Pt self converted will no longer need weekly INRs for cardioversion.  We are decreasing his amiodarone to once a day on Friday. Thanks Nurse, adult [facility]

## 2017-11-30 NOTE — Telephone Encounter (Signed)
Spoke with pt and made him aware ok to wait until July.  Advised to call if any trouble prior to then.  Pt verbalized understanding and was in agreement with this plan.

## 2017-11-30 NOTE — Telephone Encounter (Signed)
See  Coumadin encounter of June 6th

## 2017-12-13 ENCOUNTER — Ambulatory Visit (INDEPENDENT_AMBULATORY_CARE_PROVIDER_SITE_OTHER): Payer: Medicare Other | Admitting: Pharmacist

## 2017-12-13 DIAGNOSIS — Z5181 Encounter for therapeutic drug level monitoring: Secondary | ICD-10-CM

## 2017-12-13 DIAGNOSIS — I480 Paroxysmal atrial fibrillation: Secondary | ICD-10-CM | POA: Diagnosis not present

## 2017-12-13 LAB — POCT INR: INR: 3.1 — AB (ref 2.0–3.0)

## 2017-12-13 NOTE — Patient Instructions (Addendum)
Description   Eat an extra serving of greens today. Continue taking 1 tablet every daily. Recheck in 4 weeks - same day as office visit with Dr [name]. Remain consistent with leafy green veggies.  Call if scheduled for any procedures, surgery or medication changes at [phone]

## 2017-12-18 ENCOUNTER — Encounter: Payer: Self-pay | Admitting: Podiatry

## 2017-12-18 ENCOUNTER — Ambulatory Visit (INDEPENDENT_AMBULATORY_CARE_PROVIDER_SITE_OTHER): Payer: Medicare Other | Admitting: Podiatry

## 2017-12-18 DIAGNOSIS — B351 Tinea unguium: Secondary | ICD-10-CM | POA: Diagnosis not present

## 2017-12-18 DIAGNOSIS — M79674 Pain in right toe(s): Secondary | ICD-10-CM

## 2017-12-18 DIAGNOSIS — E1142 Type 2 diabetes mellitus with diabetic polyneuropathy: Secondary | ICD-10-CM

## 2017-12-18 DIAGNOSIS — D689 Coagulation defect, unspecified: Secondary | ICD-10-CM

## 2017-12-18 DIAGNOSIS — M79675 Pain in left toe(s): Secondary | ICD-10-CM | POA: Diagnosis not present

## 2017-12-18 NOTE — Progress Notes (Signed)
Complaint:  Visit Type: Patient returns to my office for continued preventative foot care services. Complaint: Patient states" my nails have grown long and thick and become painful to walk and wear shoes" Patient has been diagnosed with DM with no foot complications. The patient presents for preventative foot care services. No changes to ROS  Podiatric Exam: Vascular: dorsalis pedis and posterior tibial pulses are palpable bilateral. Capillary return is immediate. Temperature gradient is WNL. Skin turgor WNL  Sensorium: Normal Semmes Weinstein monofilament test. Normal tactile sensation bilaterally. Nail Exam: Pt has thick disfigured discolored nails with subungual debris noted bilateral entire nail hallux through fifth toenails Ulcer Exam: There is no evidence of ulcer or pre-ulcerative changes or infection. Orthopedic Exam: Muscle tone and strength are WNL. No limitations in general ROM. No crepitus or effusions noted. Foot type and digits show no abnormalities. Bony prominences are unremarkable.  Hallux malleus right hallux.  Hammer toes 2-5  B/L Skin: No Porokeratosis. No infection or ulcers  Diagnosis:  Onychomycosis, , Pain in right toe, pain in left toes  Treatment & Plan Procedures and Treatment: Consent by patient was obtained for treatment procedures.   Debridement of mycotic and hypertrophic toenails, 1 through 5 bilateral and clearing of subungual debris. No ulceration, no infection noted.  Return Visit-Office Procedure: Patient instructed to return to the office for a follow up visit 3 months for continued evaluation and treatment.    Bonnetta Allbee DPM 

## 2017-12-25 DIAGNOSIS — I129 Hypertensive chronic kidney disease with stage 1 through stage 4 chronic kidney disease, or unspecified chronic kidney disease: Secondary | ICD-10-CM | POA: Diagnosis not present

## 2017-12-25 DIAGNOSIS — E875 Hyperkalemia: Secondary | ICD-10-CM | POA: Diagnosis not present

## 2017-12-25 DIAGNOSIS — Z6829 Body mass index (BMI) 29.0-29.9, adult: Secondary | ICD-10-CM | POA: Diagnosis not present

## 2017-12-25 DIAGNOSIS — I480 Paroxysmal atrial fibrillation: Secondary | ICD-10-CM | POA: Diagnosis not present

## 2017-12-25 DIAGNOSIS — N183 Chronic kidney disease, stage 3 (moderate): Secondary | ICD-10-CM | POA: Diagnosis not present

## 2017-12-25 DIAGNOSIS — E1122 Type 2 diabetes mellitus with diabetic chronic kidney disease: Secondary | ICD-10-CM | POA: Diagnosis not present

## 2018-01-09 DIAGNOSIS — E113393 Type 2 diabetes mellitus with moderate nonproliferative diabetic retinopathy without macular edema, bilateral: Secondary | ICD-10-CM | POA: Diagnosis not present

## 2018-01-09 DIAGNOSIS — H35033 Hypertensive retinopathy, bilateral: Secondary | ICD-10-CM | POA: Diagnosis not present

## 2018-01-09 DIAGNOSIS — H2513 Age-related nuclear cataract, bilateral: Secondary | ICD-10-CM | POA: Diagnosis not present

## 2018-01-09 DIAGNOSIS — H1849 Other corneal degeneration: Secondary | ICD-10-CM | POA: Diagnosis not present

## 2018-01-09 NOTE — Progress Notes (Signed)
Cardiology Office Note    Date:  01/10/2018   ID:  [name], DOB 11/09/1950, MRN [id number]  PCP:  [name], MD  Cardiologist: Sinclair Grooms, MD   Chief Complaint  Patient presents with  . Atrial Fibrillation    History of Present Illness:  [name] is a 67 y.o. male  who presents for paroxysmal A. fib, hypertension, diabetes, and chronic anticoagulation with Coumadin therapy.  Developed atrial fibrillation Nov 20, 2017.  Was seen in the atrial fibrillation clinic by Roderic Palau.  He is now back in sinus rhythm.  He has good exertional energy.  He denies chest pain.  He feels he is in and out of rhythm frequently but episodes last usually less than 20 to 30 minutes.  Each time he has been persistently out of rhythm for hours to days and increase in amiodarone to 400 mg for a week or so helps revert him to sinus rhythm.  He has had one electrical conversion on the past.  Episodes of irregular heart rhythm and fatigue are becoming more frequent.   Past Medical History:  Diagnosis Date  . CAD (coronary artery disease)    70% LCx stenosis, treated medically  . Diabetes mellitus   . GERD (gastroesophageal reflux disease)   . Hypertension   . Hypotension   . Left atrial enlargement   . Persistent atrial fibrillation ([place])   . Sleep apnea   . tonsillar ca dx'd 02/2006   xrt/chemo comp 05/2006    Past Surgical History:  Procedure Laterality Date  . BREAST SURGERY    . CARDIAC CATHETERIZATION    . CARDIOVERSION N/A 10/14/2015   Procedure: CARDIOVERSION;  Surgeon: Lelon Perla, MD;  Location[facility] ENDOSCOPY;  Service: Cardiovascular;  Laterality: N/A;  . COLONOSCOPY WITH PROPOFOL N/A 06/03/2015   Procedure: COLONOSCOPY WITH PROPOFOL;  Surgeon: Laurence Spates, MD;  Location: [facility] ENDOSCOPY;  Service: Endoscopy;  Laterality: N/A;  . HOT HEMOSTASIS N/A 06/03/2015   Procedure: HOT HEMOSTASIS (ARGON PLASMA COAGULATION/BICAP);  Surgeon: Laurence Spates, MD;  Location:  [facility] ENDOSCOPY;  Service: Endoscopy;  Laterality: N/A;    Current Medications: Outpatient Medications Prior to Visit  Medication Sig Dispense Refill  . amiodarone (PACERONE) 200 MG tablet Take 200 mg by mouth daily.    Marland Kitchen dicyclomine (BENTYL) 20 MG tablet Take 20 mg by mouth 3 (three) times daily before meals.    . docusate sodium (COLACE) 100 MG capsule Take 100 mg by mouth 2 (two) times daily as needed. For stool softener.    . doxazosin (CARDURA) 4 MG tablet Take 4 mg by mouth daily.    . fluorouracil (EFUDEX) 5 % cream     . hydrochlorothiazide (HYDRODIURIL) 25 MG tablet Take 25 mg by mouth daily.    Marland Kitchen levothyroxine (SYNTHROID, LEVOTHROID) 100 MCG tablet Take 100 mcg by mouth daily before breakfast.    . liothyronine (CYTOMEL) 5 MCG tablet Take 5 mcg by mouth daily.    . metFORMIN (GLUCOPHAGE) 1000 MG tablet Take 1,000 mg by mouth 2 (two) times daily with a meal.    . metoprolol succinate (TOPROL-XL) 25 MG 24 hr tablet TAKE ONE TABLET BY MOUTH ONCE DAILY 90 tablet 3  . Multiple Vitamin (MULITIVITAMIN WITH MINERALS) TABS Take 1 tablet by mouth daily.    . Omega-3 Fatty Acids (FISH OIL PO) Take 2 capsules by mouth daily.    . pravastatin (PRAVACHOL) 80 MG tablet Take 80 mg by mouth at bedtime.     Marland Kitchen  quinapril (ACCUPRIL) 40 MG tablet Take 20-40 mg by mouth 2 (two) times daily. Take 1 tablet in AM, and 1/2 tablet in evening    . ranitidine (ZANTAC) 150 MG tablet Take 150 mg by mouth daily.    Marland Kitchen warfarin (COUMADIN) 5 MG tablet TAKE AS DIRECTED BY ANTICOAGULATION 90 tablet 1  . amiodarone (PACERONE) 200 MG tablet Take 200mg  twice a day until 6/7 then reduce to 200mg  once a day (Patient taking differently: Take 200 mg by mouth daily. ) 90 tablet 3  . doxazosin (CARDURA) 2 MG tablet Take 4 mg by mouth daily.      No facility-administered medications prior to visit.      Allergies:   Nsaids   Social History   Socioeconomic History  . Marital status: Married    Spouse name: Not on file  .  Number of children: Not on file  . Years of education: Not on file  . Highest education level: Not on file  Occupational History  . Not on file  Social Needs  . Financial resource strain: Not on file  . Food insecurity:    Worry: Not on file    Inability: Not on file  . Transportation needs:    Medical: Not on file    Non-medical: Not on file  Tobacco Use  . Smoking status: Never Smoker  . Smokeless tobacco: Never Used  Substance and Sexual Activity  . Alcohol use: No    Alcohol/week: 0.0 oz  . Drug use: No  . Sexual activity: Not on file  Lifestyle  . Physical activity:    Days per week: Not on file    Minutes per session: Not on file  . Stress: Not on file  Relationships  . Social connections:    Talks on phone: Not on file    Gets together: Not on file    Attends religious service: Not on file    Active member of club or organization: Not on file    Attends meetings of clubs or organizations: Not on file    Relationship status: Not on file  Other Topics Concern  . Not on file  Social History Narrative  . Not on file     Family History:  The patient's family history includes Coronary artery disease in his father.   ROS:   Please see the history of present illness.    Recent chills, leg swelling, excessive fatigue, skipped heartbeats, difficulty with balance, easy bruising with bleeding, and dizziness. All other systems reviewed and are negative.   PHYSICAL EXAM:   VS:  BP 124/72   Pulse (!) 51   Ht 6\' 3"  (1.905 m)   Wt 236 lb (107 kg)   BMI 29.50 kg/m    GEN: Well nourished, well developed, in no acute distress .  Ecchymoses on both arms. HEENT: normal  Neck: no JVD, carotid bruits, or masses Cardiac: RRR; no murmurs, rubs, or gallops,no edema  Respiratory:  clear to auscultation bilaterally, normal work of breathing GI: soft, nontender, nondistended, + BS MS: no deformity or atrophy  Skin: warm and dry, no rash Neuro:  Alert and Oriented x 3, Strength  and sensation are intact Psych: euthymic mood, full affect  Wt Readings from Last 3 Encounters:  01/10/18 236 lb (107 kg)  11/26/17 232 lb (105.2 kg)  11/20/17 234 lb 3.2 oz (106.2 kg)      Studies/Labs Reviewed:   EKG:  EKG sinus bradycardia, poor R wave progression, left axis  deviation.  No acute changes.  This EKG was personally reviewed and was performed on 11/26/2017.  Recent Labs: 03/20/2017: NT-Pro BNP 314 11/20/2017: ALT 16; Hemoglobin 14.0; Platelets 182; TSH 2.608 11/26/2017: BUN 26; Creatinine, Ser 1.81; Potassium 4.2; Sodium 139   Lipid Panel No results found for: CHOL, TRIG, HDL, CHOLHDL, VLDL, LDLCALC, LDLDIRECT  Additional studies/ records that were reviewed today include:  None    ASSESSMENT:    1. Paroxysmal atrial fibrillation (HCC)   2. Obstructive sleep apnea   3. Essential hypertension   4. Type 2 diabetes mellitus without complication, without long-term current use of insulin (HCC)   5. Other hyperlipidemia      PLAN:  In order of problems listed above:  1. Recurring episodes of atrial fibrillation despite amiodarone.  At least 2 episodes up within the past 12 months on amiodarone 200 mg/day.  Will refer to EP to determine if other treatment strategies are suggested?  Ablation.  The only concern is that when he has atrial fibrillation he is very physically impaired and quality of life is quite poor. 2. I encouraged compliance with CPAP. 3. Blood pressure target 130/80 mmHg.  This was discussed in detail with the patient. 4. A1c target should be less than 7 to prevent progression of atherosclerosis. 5. LDL target less than 70. 6. Amiodarone therapy will be continued at 200 mg/day.  Liver and lipid panel will be performed in 6 months on return visit.    Medication Adjustments/Labs and Tests Ordered: Current medicines are reviewed at length with the patient today.  Concerns regarding medicines are outlined above.  Medication changes, Labs and Tests  ordered today are listed in the Patient Instructions below. There are no Patient Instructions on file for this visit.   Signed, Sinclair Grooms, MD  01/10/2018 8:11 AM    [facility] [address], [place], [place]  [address] Phone: [phone]; Fax: [phone]

## 2018-01-10 ENCOUNTER — Ambulatory Visit (INDEPENDENT_AMBULATORY_CARE_PROVIDER_SITE_OTHER): Payer: Medicare Other | Admitting: Pharmacist

## 2018-01-10 ENCOUNTER — Encounter: Payer: Self-pay | Admitting: Interventional Cardiology

## 2018-01-10 ENCOUNTER — Ambulatory Visit (INDEPENDENT_AMBULATORY_CARE_PROVIDER_SITE_OTHER): Payer: Medicare Other | Admitting: Interventional Cardiology

## 2018-01-10 VITALS — BP 124/72 | HR 51 | Ht 75.0 in | Wt 236.0 lb

## 2018-01-10 DIAGNOSIS — I480 Paroxysmal atrial fibrillation: Secondary | ICD-10-CM | POA: Diagnosis not present

## 2018-01-10 DIAGNOSIS — E7849 Other hyperlipidemia: Secondary | ICD-10-CM | POA: Diagnosis not present

## 2018-01-10 DIAGNOSIS — Z5181 Encounter for therapeutic drug level monitoring: Secondary | ICD-10-CM | POA: Diagnosis not present

## 2018-01-10 DIAGNOSIS — G4733 Obstructive sleep apnea (adult) (pediatric): Secondary | ICD-10-CM | POA: Diagnosis not present

## 2018-01-10 DIAGNOSIS — I10 Essential (primary) hypertension: Secondary | ICD-10-CM | POA: Diagnosis not present

## 2018-01-10 DIAGNOSIS — I4891 Unspecified atrial fibrillation: Secondary | ICD-10-CM

## 2018-01-10 DIAGNOSIS — Z789 Other specified health status: Secondary | ICD-10-CM

## 2018-01-10 DIAGNOSIS — Z7901 Long term (current) use of anticoagulants: Secondary | ICD-10-CM

## 2018-01-10 DIAGNOSIS — E119 Type 2 diabetes mellitus without complications: Secondary | ICD-10-CM | POA: Diagnosis not present

## 2018-01-10 LAB — POCT INR: INR: 2.9 (ref 2.0–3.0)

## 2018-01-10 NOTE — Patient Instructions (Signed)
Medication Instructions:  Your physician recommends that you continue on your current medications as directed. Please refer to the Current Medication list given to you today.  Labwork: None  Testing/Procedures: None  Follow-Up: You have been referred to our Electrophysiology Department to discuss controlling your Afib/ possible Afib Ablation.   Your physician wants you to follow-up in: 6 months with Dr. [name].  You will receive a reminder letter in the mail two months in advance. If you don't receive a letter, please call our office to schedule the follow-up appointment.    Any Other Special Instructions Will Be Listed Below (If Applicable).     If you need a refill on your cardiac medications before your next appointment, please call your pharmacy.

## 2018-01-10 NOTE — Patient Instructions (Signed)
Description   Continue taking 1 tablet every daily. Recheck in 4 weeks. Remain consistent with leafy green veggies.  Call if scheduled for any procedures, surgery or medication changes at [phone]

## 2018-01-28 ENCOUNTER — Ambulatory Visit (HOSPITAL_COMMUNITY): Payer: Medicare Other | Attending: Internal Medicine

## 2018-01-28 ENCOUNTER — Ambulatory Visit (INDEPENDENT_AMBULATORY_CARE_PROVIDER_SITE_OTHER): Payer: Medicare Other | Admitting: Internal Medicine

## 2018-01-28 ENCOUNTER — Encounter: Payer: Self-pay | Admitting: Internal Medicine

## 2018-01-28 ENCOUNTER — Other Ambulatory Visit: Payer: Self-pay

## 2018-01-28 VITALS — BP 146/80 | HR 52 | Ht 75.0 in | Wt 236.0 lb

## 2018-01-28 DIAGNOSIS — I059 Rheumatic mitral valve disease, unspecified: Secondary | ICD-10-CM | POA: Diagnosis not present

## 2018-01-28 DIAGNOSIS — I77810 Thoracic aortic ectasia: Secondary | ICD-10-CM | POA: Diagnosis not present

## 2018-01-28 DIAGNOSIS — G4733 Obstructive sleep apnea (adult) (pediatric): Secondary | ICD-10-CM | POA: Diagnosis not present

## 2018-01-28 DIAGNOSIS — I10 Essential (primary) hypertension: Secondary | ICD-10-CM

## 2018-01-28 DIAGNOSIS — I119 Hypertensive heart disease without heart failure: Secondary | ICD-10-CM | POA: Diagnosis not present

## 2018-01-28 DIAGNOSIS — E119 Type 2 diabetes mellitus without complications: Secondary | ICD-10-CM | POA: Insufficient documentation

## 2018-01-28 DIAGNOSIS — I480 Paroxysmal atrial fibrillation: Secondary | ICD-10-CM | POA: Insufficient documentation

## 2018-01-28 LAB — ECHOCARDIOGRAM COMPLETE
Height: 75 in
Weight: 3776 oz

## 2018-01-28 NOTE — Progress Notes (Signed)
PCP: [name], MD Primary Cardiologist: Dr [name] Primary EP: Dr [name]  [name] is a 67 y.o. male who presents today for routine electrophysiology followup.  Since last being seen in our clinic, the patient reports doing reasonably well.  He continues to have episodes of afib.  He reports fatigue and decreased exercise tolerance with his afib.  Though on my previous visit with him (2017), he did not seem to feel much better with sinus, he now clearly can tell when he is in afib.  Episodes occur every few days, lasting 20-30 minutes.  He has required cardioversion only once.  Today, he denies symptoms of palpitations, chest pain, shortness of breath,  lower extremity edema, dizziness, presyncope, or syncope.  The patient is otherwise without complaint today.   He is not currently compliant with CPAP, which I have encouraged today.  Past Medical History:  Diagnosis Date  . CAD (coronary artery disease)    70% LCx stenosis, treated medically  . Diabetes mellitus   . GERD (gastroesophageal reflux disease)   . Hypertension   . Hypotension   . Left atrial enlargement   . Persistent atrial fibrillation ([place])   . Sleep apnea   . tonsillar ca dx'd 02/2006   xrt/chemo comp 05/2006   Past Surgical History:  Procedure Laterality Date  . BREAST SURGERY    . CARDIAC CATHETERIZATION    . CARDIOVERSION N/A 10/14/2015   Procedure: CARDIOVERSION;  Surgeon: Lelon Perla, MD;  Location: [facility] ENDOSCOPY;  Service: Cardiovascular;  Laterality: N/A;  . COLONOSCOPY WITH PROPOFOL N/A 06/03/2015   Procedure: COLONOSCOPY WITH PROPOFOL;  Surgeon: Laurence Spates, MD;  Location: [facility] ENDOSCOPY;  Service: Endoscopy;  Laterality: N/A;  . HOT HEMOSTASIS N/A 06/03/2015   Procedure: HOT HEMOSTASIS (ARGON PLASMA COAGULATION/BICAP);  Surgeon: Laurence Spates, MD;  Location: [facility] ENDOSCOPY;  Service: Endoscopy;  Laterality: N/A;    ROS- all systems are reviewed and negatives except as per HPI above  Current  Outpatient Medications  Medication Sig Dispense Refill  . amiodarone (PACERONE) 200 MG tablet Take 200 mg by mouth daily.    Marland Kitchen dicyclomine (BENTYL) 20 MG tablet Take 20 mg by mouth 3 (three) times daily before meals.    . docusate sodium (COLACE) 100 MG capsule Take 100 mg by mouth 2 (two) times daily as needed. For stool softener.    . doxazosin (CARDURA) 4 MG tablet Take 4 mg by mouth daily.    . fluorouracil (EFUDEX) 5 % cream     . hydrochlorothiazide (HYDRODIURIL) 25 MG tablet Take 25 mg by mouth daily.    Marland Kitchen levothyroxine (SYNTHROID, LEVOTHROID) 100 MCG tablet Take 100 mcg by mouth daily before breakfast.    . liothyronine (CYTOMEL) 5 MCG tablet Take 5 mcg by mouth daily.    . metFORMIN (GLUCOPHAGE) 1000 MG tablet Take 1,000 mg by mouth 2 (two) times daily with a meal.    . metoprolol succinate (TOPROL-XL) 25 MG 24 hr tablet TAKE ONE TABLET BY MOUTH ONCE DAILY 90 tablet 3  . Multiple Vitamin (MULITIVITAMIN WITH MINERALS) TABS Take 1 tablet by mouth daily.    . Omega-3 Fatty Acids (FISH OIL PO) Take 2 capsules by mouth daily.    . pravastatin (PRAVACHOL) 80 MG tablet Take 80 mg by mouth at bedtime.     . quinapril (ACCUPRIL) 40 MG tablet Take 20-40 mg by mouth 2 (two) times daily. Take 1 tablet in AM, and 1/2 tablet in evening    . ranitidine (  ZANTAC) 150 MG tablet Take 150 mg by mouth daily.    Marland Kitchen warfarin (COUMADIN) 5 MG tablet TAKE AS DIRECTED BY ANTICOAGULATION 90 tablet 1   No current facility-administered medications for this visit.     Physical Exam: Vitals:   01/28/18 0906  BP: (!) 146/80  Pulse: (!) 52  SpO2: 99%  Weight: 236 lb (107 kg)  Height: 6\' 3"  (1.905 m)    GEN- The patient is well appearing, alert and oriented x 3 today.   Head- normocephalic, atraumatic Eyes-  Sclera clear, conjunctiva pink Ears- hearing intact Oropharynx- clear Lungs- Clear to ausculation bilaterally, normal work of breathing Heart- Regular rate and rhythm, no murmurs, rubs or gallops, PMI  not laterally displaced GI- soft, NT, ND, + BS Extremities- no clubbing, cyanosis, or edema  Wt Readings from Last 3 Encounters:  01/28/18 236 lb (107 kg)  01/10/18 236 lb (107 kg)  11/26/17 232 lb (105.2 kg)    EKG tracing ordered today is personally reviewed and shows sinus rhythm 52 bpm, PR 222 msc, QRS 92 msec, QTc 433 msec  Echo 2/17 reviewed- moderate LVH, EF 50-55%, mild to moderate LA enlargement  Assessment and Plan:  1. Paroxysmal atrial fibrillation The patient has symptomatic, recurrent paroxysmal atrial fibrillation. he has failed medical therapy with amiodarone. Chads2vasc score is 2.  he is anticoagulated with coumadin. Obtain echo to evaluate for structural changes related to his AFib. Therapeutic strategies for afib including medicine and ablation were discussed in detail with the patient today. Risk, benefits, and alternatives to EP study and radiofrequency ablation for afib were also discussed in detail today. These risks include but are not limited to stroke, bleeding, vascular damage, tamponade, perforation, damage to the esophagus, lungs, and other structures, pulmonary vein stenosis, worsening renal function, and death. The patient understands these risk and wishes to think about this further.  He will contact our office if he wishes to proceed.  Would plan cardiac CT prior to ablation if creatinine allows. We also discussed ILR for long term arrhythmia monitoring at length today.  He is interested in this.  He will contact his insurance company.  If he decides to proceed with either ILR or ablation, he will contact my office.  2. OSA Compliance with CPAP encouraged  3. HTN Stable No change required today  4. CAD No ischemic symptoms No changes  5. Obesity Body mass index is 29.5 kg/m. Wt Readings from Last 3 Encounters:  01/28/18 236 lb (107 kg)  01/10/18 236 lb (107 kg)  11/26/17 232 lb (105.2 kg)   Lifestyle modification encouraged  Follow-up  with Dr [name] as scheduled Follow-up in AF clinic in 3 months  Thompson Grayer MD, [facility] 01/28/2018 9:35 AM

## 2018-01-28 NOTE — Patient Instructions (Addendum)
Medication Instructions:  Your physician recommends that you continue on your current medications as directed. Please refer to the Current Medication list given to you today.  Contact Dr. Maxwell Caul about your CPAP and sleep apnea  Labwork: None ordered.  Testing/Procedures: Your physician has requested that you have an echocardiogram. Echocardiography is a painless test that uses sound waves to create images of your heart. It provides your doctor with information about the size and shape of your heart and how well your heart's chambers and valves are working. This procedure takes approximately one hour. There are no restrictions for this procedure.  Please schedule for ECHO  Follow-Up: Your physician wants you to follow-up in: 3 months with Roderic Palau at the Afib clinic.  Any Other Special Instructions Will Be Listed Below (If Applicable).  If you need a refill on your cardiac medications before your next appointment, please call your pharmacy.   If you decide you want to go ahead with either a loop recorder or an ablation call me to discuss.  Sonia Baller RN [phone]

## 2018-02-08 ENCOUNTER — Ambulatory Visit (INDEPENDENT_AMBULATORY_CARE_PROVIDER_SITE_OTHER): Payer: Medicare Other | Admitting: *Deleted

## 2018-02-08 DIAGNOSIS — I4891 Unspecified atrial fibrillation: Secondary | ICD-10-CM

## 2018-02-08 DIAGNOSIS — Z789 Other specified health status: Secondary | ICD-10-CM | POA: Diagnosis not present

## 2018-02-08 DIAGNOSIS — Z7901 Long term (current) use of anticoagulants: Secondary | ICD-10-CM

## 2018-02-08 DIAGNOSIS — Z5181 Encounter for therapeutic drug level monitoring: Secondary | ICD-10-CM

## 2018-02-08 DIAGNOSIS — I480 Paroxysmal atrial fibrillation: Secondary | ICD-10-CM

## 2018-02-08 LAB — POCT INR: INR: 3.9 — AB (ref 2.0–3.0)

## 2018-02-08 NOTE — Patient Instructions (Addendum)
Skip today dose than Continue taking 1 tablet every daily. Recheck in 2 weeks. Remain consistent with leafy green veggies.  Call if scheduled for any procedures, surgery or medication changes at [phone]

## 2018-02-13 ENCOUNTER — Other Ambulatory Visit: Payer: Self-pay | Admitting: Interventional Cardiology

## 2018-02-13 DIAGNOSIS — G4733 Obstructive sleep apnea (adult) (pediatric): Secondary | ICD-10-CM

## 2018-02-13 DIAGNOSIS — I10 Essential (primary) hypertension: Secondary | ICD-10-CM

## 2018-02-13 DIAGNOSIS — Z5181 Encounter for therapeutic drug level monitoring: Secondary | ICD-10-CM

## 2018-02-13 DIAGNOSIS — I480 Paroxysmal atrial fibrillation: Secondary | ICD-10-CM

## 2018-02-19 ENCOUNTER — Other Ambulatory Visit: Payer: Self-pay | Admitting: Interventional Cardiology

## 2018-02-20 ENCOUNTER — Telehealth: Payer: Self-pay | Admitting: Interventional Cardiology

## 2018-02-20 MED ORDER — WARFARIN SODIUM 5 MG PO TABS: ORAL_TABLET | ORAL | 0 refills | Status: DC

## 2018-02-20 NOTE — Telephone Encounter (Signed)
rx clarification resent to pharmacy as requested.

## 2018-02-20 NOTE — Telephone Encounter (Signed)
New Message:    Needs clarification for his directions for pt's Warfarin.

## 2018-02-21 ENCOUNTER — Ambulatory Visit (INDEPENDENT_AMBULATORY_CARE_PROVIDER_SITE_OTHER): Payer: Medicare Other

## 2018-02-21 DIAGNOSIS — I480 Paroxysmal atrial fibrillation: Secondary | ICD-10-CM | POA: Diagnosis not present

## 2018-02-21 DIAGNOSIS — Z789 Other specified health status: Secondary | ICD-10-CM

## 2018-02-21 DIAGNOSIS — Z5181 Encounter for therapeutic drug level monitoring: Secondary | ICD-10-CM | POA: Diagnosis not present

## 2018-02-21 DIAGNOSIS — I4891 Unspecified atrial fibrillation: Secondary | ICD-10-CM

## 2018-02-21 DIAGNOSIS — Z7901 Long term (current) use of anticoagulants: Secondary | ICD-10-CM

## 2018-02-21 LAB — POCT INR: INR: 3.1 — AB (ref 2.0–3.0)

## 2018-02-21 NOTE — Patient Instructions (Signed)
Description   Start taking 1 tablet daily except 1/2 tablet on Thursdays.  Recheck in 2 weeks. Remain consistent with leafy green veggies.  Call if scheduled for any procedures, surgery or medication changes at [phone]

## 2018-03-08 ENCOUNTER — Ambulatory Visit (INDEPENDENT_AMBULATORY_CARE_PROVIDER_SITE_OTHER): Payer: Medicare Other | Admitting: Pharmacist

## 2018-03-08 DIAGNOSIS — I4891 Unspecified atrial fibrillation: Secondary | ICD-10-CM

## 2018-03-08 DIAGNOSIS — Z789 Other specified health status: Secondary | ICD-10-CM

## 2018-03-08 DIAGNOSIS — I480 Paroxysmal atrial fibrillation: Secondary | ICD-10-CM | POA: Diagnosis not present

## 2018-03-08 DIAGNOSIS — Z7901 Long term (current) use of anticoagulants: Secondary | ICD-10-CM

## 2018-03-08 DIAGNOSIS — Z5181 Encounter for therapeutic drug level monitoring: Secondary | ICD-10-CM | POA: Diagnosis not present

## 2018-03-08 LAB — POCT INR: INR: 3.8 — AB (ref 2.0–3.0)

## 2018-03-08 NOTE — Patient Instructions (Signed)
Description   Skip dose today then change dose to 1 tablet daily except 1/2 tablet on Sundays and Thursdays.  Recheck in 2 weeks. Remain consistent with leafy green veggies.  Call if scheduled for any procedures, surgery or medication changes at [phone]

## 2018-03-19 ENCOUNTER — Ambulatory Visit (HOSPITAL_COMMUNITY)
Admission: RE | Admit: 2018-03-19 | Discharge: 2018-03-19 | Disposition: A | Discharge status: Home / Self Care | Payer: Medicare Other | Source: Ambulatory Visit | Attending: Internal Medicine | Admitting: Internal Medicine

## 2018-03-19 ENCOUNTER — Encounter (HOSPITAL_COMMUNITY): Payer: Self-pay | Admitting: Internal Medicine

## 2018-03-19 ENCOUNTER — Encounter (HOSPITAL_COMMUNITY)
Admission: RE | Disposition: A | Discharge status: Home / Self Care | Payer: Self-pay | Source: Ambulatory Visit | Attending: Internal Medicine

## 2018-03-19 ENCOUNTER — Other Ambulatory Visit: Payer: Self-pay

## 2018-03-19 ENCOUNTER — Ambulatory Visit (INDEPENDENT_AMBULATORY_CARE_PROVIDER_SITE_OTHER): Payer: Medicare Other | Admitting: Podiatry

## 2018-03-19 DIAGNOSIS — I4891 Unspecified atrial fibrillation: Secondary | ICD-10-CM | POA: Diagnosis not present

## 2018-03-19 DIAGNOSIS — Z79890 Hormone replacement therapy (postmenopausal): Secondary | ICD-10-CM | POA: Insufficient documentation

## 2018-03-19 DIAGNOSIS — Z9221 Personal history of antineoplastic chemotherapy: Secondary | ICD-10-CM | POA: Insufficient documentation

## 2018-03-19 DIAGNOSIS — I481 Persistent atrial fibrillation: Secondary | ICD-10-CM | POA: Diagnosis not present

## 2018-03-19 DIAGNOSIS — I10 Essential (primary) hypertension: Secondary | ICD-10-CM | POA: Diagnosis not present

## 2018-03-19 DIAGNOSIS — Z7984 Long term (current) use of oral hypoglycemic drugs: Secondary | ICD-10-CM | POA: Diagnosis not present

## 2018-03-19 DIAGNOSIS — Z7901 Long term (current) use of anticoagulants: Secondary | ICD-10-CM | POA: Diagnosis not present

## 2018-03-19 DIAGNOSIS — B351 Tinea unguium: Secondary | ICD-10-CM | POA: Diagnosis not present

## 2018-03-19 DIAGNOSIS — M79674 Pain in right toe(s): Secondary | ICD-10-CM | POA: Diagnosis not present

## 2018-03-19 DIAGNOSIS — E119 Type 2 diabetes mellitus without complications: Secondary | ICD-10-CM | POA: Diagnosis not present

## 2018-03-19 DIAGNOSIS — R002 Palpitations: Secondary | ICD-10-CM | POA: Diagnosis not present

## 2018-03-19 DIAGNOSIS — Z79899 Other long term (current) drug therapy: Secondary | ICD-10-CM | POA: Diagnosis not present

## 2018-03-19 DIAGNOSIS — K219 Gastro-esophageal reflux disease without esophagitis: Secondary | ICD-10-CM | POA: Diagnosis not present

## 2018-03-19 DIAGNOSIS — E1142 Type 2 diabetes mellitus with diabetic polyneuropathy: Secondary | ICD-10-CM

## 2018-03-19 DIAGNOSIS — Z9889 Other specified postprocedural states: Secondary | ICD-10-CM | POA: Diagnosis not present

## 2018-03-19 DIAGNOSIS — M79675 Pain in left toe(s): Secondary | ICD-10-CM

## 2018-03-19 DIAGNOSIS — G4730 Sleep apnea, unspecified: Secondary | ICD-10-CM | POA: Insufficient documentation

## 2018-03-19 DIAGNOSIS — I2510 Atherosclerotic heart disease of native coronary artery without angina pectoris: Secondary | ICD-10-CM | POA: Insufficient documentation

## 2018-03-19 DIAGNOSIS — I480 Paroxysmal atrial fibrillation: Secondary | ICD-10-CM | POA: Diagnosis not present

## 2018-03-19 DIAGNOSIS — Z85818 Personal history of malignant neoplasm of other sites of lip, oral cavity, and pharynx: Secondary | ICD-10-CM | POA: Diagnosis not present

## 2018-03-19 DIAGNOSIS — D689 Coagulation defect, unspecified: Secondary | ICD-10-CM

## 2018-03-19 HISTORY — PX: LOOP RECORDER INSERTION: EP1214

## 2018-03-19 LAB — GLUCOSE, CAPILLARY: Glucose-Capillary: 104 mg/dL — ABNORMAL HIGH (ref 70–99)

## 2018-03-19 SURGERY — LOOP RECORDER INSERTION

## 2018-03-19 MED ORDER — LIDOCAINE-EPINEPHRINE 1 %-1:100000 IJ SOLN
INTRAMUSCULAR | Status: DC | PRN
  Administered 2018-03-19: 10 mL

## 2018-03-19 MED ORDER — LIDOCAINE-EPINEPHRINE 1 %-1:100000 IJ SOLN
INTRAMUSCULAR | Status: AC
  Filled 2018-03-19: qty 1

## 2018-03-19 SURGICAL SUPPLY — 2 items
LOOP REVEAL LINQSYS (Prosthesis & Implant Heart) ×3 IMPLANT
PACK LOOP INSERTION (CUSTOM PROCEDURE TRAY) ×3 IMPLANT

## 2018-03-19 NOTE — Progress Notes (Signed)
Complaint:  Visit Type: Patient returns to my office for continued preventative foot care services. Complaint: Patient states" my nails have grown long and thick and become painful to walk and wear shoes" Patient has been diagnosed with DM with no foot complications. The patient presents for preventative foot care services. No changes to ROS  Podiatric Exam: Vascular: dorsalis pedis and posterior tibial pulses are palpable bilateral. Capillary return is immediate. Temperature gradient is WNL. Skin turgor WNL  Sensorium: Normal Semmes Weinstein monofilament test. Normal tactile sensation bilaterally. Nail Exam: Pt has thick disfigured discolored nails with subungual debris noted bilateral entire nail hallux through fifth toenails Ulcer Exam: There is no evidence of ulcer or pre-ulcerative changes or infection. Orthopedic Exam: Muscle tone and strength are WNL. No limitations in general ROM. No crepitus or effusions noted. Foot type and digits show no abnormalities. Bony prominences are unremarkable.  Hallux malleus right hallux.  Hammer toes 2-5  B/L Skin: No Porokeratosis. No infection or ulcers  Diagnosis:  Onychomycosis, , Pain in right toe, pain in left toes  Treatment & Plan Procedures and Treatment: Consent by patient was obtained for treatment procedures.   Debridement of mycotic and hypertrophic toenails, 1 through 5 bilateral and clearing of subungual debris. No ulceration, no infection noted.  Return Visit-Office Procedure: Patient instructed to return to the office for a follow up visit 3 months for continued evaluation and treatment.    Normagene Harvie DPM 

## 2018-03-19 NOTE — Discharge Instructions (Signed)
Verbal and handout

## 2018-03-19 NOTE — H&P (Signed)
PCP: [name], MD Primary Cardiologist: Dr [name] Primary EP: Dr Worthy Rancher is a 67 y.o. male who presents today for loop recorder implantation for long term monitoring and management of atrial fibrillation.  Since last being seen in our clinic, the patient reports doing reasonably well.  He continues to have episodes of afib.  He reports fatigue and decreased exercise tolerance with his afib.  Though on my previous visit with him (2017), he did not seem to feel much better with sinus, he now clearly can tell when he is in afib.  Episodes occur every few days, lasting 20-30 minutes.  He has required cardioversion only once.  Today, he denies symptoms of palpitations, chest pain, shortness of breath,  lower extremity edema, dizziness, presyncope, or syncope.  The patient is otherwise without complaint today.   He is not currently compliant with CPAP, which I have encouraged today.      Past Medical History:  Diagnosis Date  . CAD (coronary artery disease)    70% LCx stenosis, treated medically  . Diabetes mellitus   . GERD (gastroesophageal reflux disease)   . Hypertension   . Hypotension   . Left atrial enlargement   . Persistent atrial fibrillation ([place])   . Sleep apnea   . tonsillar ca dx'd 02/2006   xrt/chemo comp 05/2006        Past Surgical History:  Procedure Laterality Date  . BREAST SURGERY    . CARDIAC CATHETERIZATION    . CARDIOVERSION N/A 10/14/2015   Procedure: CARDIOVERSION;  Surgeon: Lelon Perla, MD;  Location: [facility] ENDOSCOPY;  Service: Cardiovascular;  Laterality: N/A;  . COLONOSCOPY WITH PROPOFOL N/A 06/03/2015   Procedure: COLONOSCOPY WITH PROPOFOL;  Surgeon: Laurence Spates, MD;  Location: [facility] ENDOSCOPY;  Service: Endoscopy;  Laterality: N/A;  . HOT HEMOSTASIS N/A 06/03/2015   Procedure: HOT HEMOSTASIS (ARGON PLASMA COAGULATION/BICAP);  Surgeon: Laurence Spates, MD;  Location: [facility] ENDOSCOPY;  Service: Endoscopy;  Laterality: N/A;     ROS- all systems are reviewed and negatives except as per HPI above        Current Outpatient Medications  Medication Sig Dispense Refill  . amiodarone (PACERONE) 200 MG tablet Take 200 mg by mouth daily.    Marland Kitchen dicyclomine (BENTYL) 20 MG tablet Take 20 mg by mouth 3 (three) times daily before meals.    . docusate sodium (COLACE) 100 MG capsule Take 100 mg by mouth 2 (two) times daily as needed. For stool softener.    . doxazosin (CARDURA) 4 MG tablet Take 4 mg by mouth daily.    . fluorouracil (EFUDEX) 5 % cream     . hydrochlorothiazide (HYDRODIURIL) 25 MG tablet Take 25 mg by mouth daily.    Marland Kitchen levothyroxine (SYNTHROID, LEVOTHROID) 100 MCG tablet Take 100 mcg by mouth daily before breakfast.    . liothyronine (CYTOMEL) 5 MCG tablet Take 5 mcg by mouth daily.    . metFORMIN (GLUCOPHAGE) 1000 MG tablet Take 1,000 mg by mouth 2 (two) times daily with a meal.    . metoprolol succinate (TOPROL-XL) 25 MG 24 hr tablet TAKE ONE TABLET BY MOUTH ONCE DAILY 90 tablet 3  . Multiple Vitamin (MULITIVITAMIN WITH MINERALS) TABS Take 1 tablet by mouth daily.    . Omega-3 Fatty Acids (FISH OIL PO) Take 2 capsules by mouth daily.    . pravastatin (PRAVACHOL) 80 MG tablet Take 80 mg by mouth at bedtime.     . quinapril (ACCUPRIL) 40 MG tablet Take  20-40 mg by mouth 2 (two) times daily. Take 1 tablet in AM, and 1/2 tablet in evening    . ranitidine (ZANTAC) 150 MG tablet Take 150 mg by mouth daily.    Marland Kitchen warfarin (COUMADIN) 5 MG tablet TAKE AS DIRECTED BY ANTICOAGULATION 90 tablet 1   No current facility-administered medications for this visit.     Physical Exam: Vitals:   03/19/18 0630  BP: (!) 177/83  Pulse: (!) 52  Resp: 18  Temp: 97.7 F (36.5 C)  SpO2: 100%   GEN- The patient is well appearing, alert and oriented x 3 today.   Head- normocephalic, atraumatic Eyes-  Sclera clear, conjunctiva pink Ears- hearing intact Oropharynx- clear Lungs- Clear to  ausculation bilaterally, normal work of breathing Heart- Regular rate and rhythm, no murmurs, rubs or gallops, PMI not laterally displaced GI- soft, NT, ND, + BS Extremities- no clubbing, cyanosis, or edema     Wt Readings from Last 3 Encounters:  01/28/18 236 lb (107 kg)  01/10/18 236 lb (107 kg)  11/26/17 232 lb (105.2 kg)     Assessment and Plan:  1. Paroxysmal atrial fibrillation The patient has symptomatic, recurrent paroxysmal atrial fibrillation. he has failed medical therapy with amiodarone.   We discussed ILR for long term arrhythmia monitoring at length today. I think that this is necessary and beneficial to determine next steps for his atrial fibrillation.  We will be able to better assess his AF frequency, evaluate for other arrhythmias, and correlate with symptoms.  This will assist with decisions regarding ablation and AAD management.  Thompson Grayer MD, [facility] 03/19/2018 7:28 AM

## 2018-03-22 ENCOUNTER — Ambulatory Visit (INDEPENDENT_AMBULATORY_CARE_PROVIDER_SITE_OTHER): Payer: Medicare Other | Admitting: Pharmacist

## 2018-03-22 DIAGNOSIS — I480 Paroxysmal atrial fibrillation: Secondary | ICD-10-CM

## 2018-03-22 DIAGNOSIS — I4891 Unspecified atrial fibrillation: Secondary | ICD-10-CM

## 2018-03-22 DIAGNOSIS — Z789 Other specified health status: Secondary | ICD-10-CM

## 2018-03-22 DIAGNOSIS — Z5181 Encounter for therapeutic drug level monitoring: Secondary | ICD-10-CM

## 2018-03-22 DIAGNOSIS — Z7901 Long term (current) use of anticoagulants: Secondary | ICD-10-CM

## 2018-03-22 LAB — POCT INR: INR: 2.7 (ref 2.0–3.0)

## 2018-03-22 NOTE — Patient Instructions (Signed)
Description   Continue taking 1 tablet daily except 1/2 tablet on Sundays and Thursdays.  Recheck in 3 weeks. Remain consistent with leafy green veggies.  Call if scheduled for any procedures, surgery or medication changes at [phone]

## 2018-03-27 DIAGNOSIS — L570 Actinic keratosis: Secondary | ICD-10-CM | POA: Diagnosis not present

## 2018-03-27 DIAGNOSIS — Z23 Encounter for immunization: Secondary | ICD-10-CM | POA: Diagnosis not present

## 2018-03-27 DIAGNOSIS — L219 Seborrheic dermatitis, unspecified: Secondary | ICD-10-CM | POA: Diagnosis not present

## 2018-04-01 ENCOUNTER — Ambulatory Visit (INDEPENDENT_AMBULATORY_CARE_PROVIDER_SITE_OTHER): Payer: Medicare Other | Admitting: *Deleted

## 2018-04-01 DIAGNOSIS — I480 Paroxysmal atrial fibrillation: Secondary | ICD-10-CM

## 2018-04-01 LAB — CUP PACEART INCLINIC DEVICE CHECK
Date Time Interrogation Session: 20191007140138
MDC IDC PG IMPLANT DT: 20190924

## 2018-04-01 NOTE — Progress Notes (Signed)
[place] check in clinic. Steri-Strips removed prior to OV. Incision edges approximated. Healing stages of bruising noted. Battery status: Good. R-waves  0.23mV. 0 symptom episodes, 0 tachy episodes, 0 pause episodes, 0 brady episodes. 0 AF episodes. Monthly summary reports and ROV with JA PRN

## 2018-04-11 ENCOUNTER — Ambulatory Visit (INDEPENDENT_AMBULATORY_CARE_PROVIDER_SITE_OTHER): Payer: Medicare Other | Admitting: *Deleted

## 2018-04-11 DIAGNOSIS — Z7901 Long term (current) use of anticoagulants: Secondary | ICD-10-CM | POA: Diagnosis not present

## 2018-04-11 DIAGNOSIS — Z5181 Encounter for therapeutic drug level monitoring: Secondary | ICD-10-CM

## 2018-04-11 DIAGNOSIS — I4891 Unspecified atrial fibrillation: Secondary | ICD-10-CM

## 2018-04-11 DIAGNOSIS — I480 Paroxysmal atrial fibrillation: Secondary | ICD-10-CM

## 2018-04-11 LAB — POCT INR: INR: 2.6 (ref 2.0–3.0)

## 2018-04-11 NOTE — Patient Instructions (Signed)
Description   Continue taking 1 tablet daily except 1/2 tablet on Sundays and Thursdays.  Recheck in 4 weeks. Remain consistent with leafy green veggies.  Call if scheduled for any procedures, surgery or medication changes at [phone]

## 2018-04-22 ENCOUNTER — Ambulatory Visit (INDEPENDENT_AMBULATORY_CARE_PROVIDER_SITE_OTHER): Payer: Medicare Other | Admitting: *Deleted

## 2018-04-22 DIAGNOSIS — I480 Paroxysmal atrial fibrillation: Secondary | ICD-10-CM

## 2018-04-22 DIAGNOSIS — I10 Essential (primary) hypertension: Secondary | ICD-10-CM

## 2018-04-22 NOTE — Progress Notes (Signed)
Carelink Summary Report / Loop Recorder 

## 2018-05-01 ENCOUNTER — Ambulatory Visit (HOSPITAL_COMMUNITY)
Admission: RE | Admit: 2018-05-01 | Discharge: 2018-05-01 | Disposition: A | Discharge status: Home / Self Care | Payer: Medicare Other | Source: Ambulatory Visit | Attending: Nurse Practitioner | Admitting: Nurse Practitioner

## 2018-05-01 VITALS — BP 172/84 | HR 49 | Ht 76.0 in | Wt 238.0 lb

## 2018-05-01 DIAGNOSIS — E785 Hyperlipidemia, unspecified: Secondary | ICD-10-CM | POA: Diagnosis not present

## 2018-05-01 DIAGNOSIS — E039 Hypothyroidism, unspecified: Secondary | ICD-10-CM | POA: Diagnosis not present

## 2018-05-01 DIAGNOSIS — Z7984 Long term (current) use of oral hypoglycemic drugs: Secondary | ICD-10-CM | POA: Insufficient documentation

## 2018-05-01 DIAGNOSIS — Z79890 Hormone replacement therapy (postmenopausal): Secondary | ICD-10-CM | POA: Insufficient documentation

## 2018-05-01 DIAGNOSIS — Z79899 Other long term (current) drug therapy: Secondary | ICD-10-CM | POA: Insufficient documentation

## 2018-05-01 DIAGNOSIS — Z8249 Family history of ischemic heart disease and other diseases of the circulatory system: Secondary | ICD-10-CM | POA: Insufficient documentation

## 2018-05-01 DIAGNOSIS — I480 Paroxysmal atrial fibrillation: Secondary | ICD-10-CM | POA: Diagnosis not present

## 2018-05-01 DIAGNOSIS — E119 Type 2 diabetes mellitus without complications: Secondary | ICD-10-CM | POA: Diagnosis not present

## 2018-05-01 DIAGNOSIS — Z85818 Personal history of malignant neoplasm of other sites of lip, oral cavity, and pharynx: Secondary | ICD-10-CM | POA: Diagnosis not present

## 2018-05-01 DIAGNOSIS — I10 Essential (primary) hypertension: Secondary | ICD-10-CM | POA: Diagnosis not present

## 2018-05-01 DIAGNOSIS — K219 Gastro-esophageal reflux disease without esophagitis: Secondary | ICD-10-CM | POA: Insufficient documentation

## 2018-05-01 DIAGNOSIS — I2510 Atherosclerotic heart disease of native coronary artery without angina pectoris: Secondary | ICD-10-CM | POA: Insufficient documentation

## 2018-05-01 DIAGNOSIS — I4819 Other persistent atrial fibrillation: Secondary | ICD-10-CM | POA: Insufficient documentation

## 2018-05-01 DIAGNOSIS — Z7901 Long term (current) use of anticoagulants: Secondary | ICD-10-CM | POA: Insufficient documentation

## 2018-05-01 DIAGNOSIS — G4733 Obstructive sleep apnea (adult) (pediatric): Secondary | ICD-10-CM | POA: Insufficient documentation

## 2018-05-01 LAB — COMPREHENSIVE METABOLIC PANEL
ALBUMIN: 3.9 g/dL (ref 3.5–5.0)
ALT: 18 U/L (ref 0–44)
AST: 19 U/L (ref 15–41)
Alkaline Phosphatase: 38 U/L (ref 38–126)
Anion gap: 8 (ref 5–15)
BILIRUBIN TOTAL: 0.5 mg/dL (ref 0.3–1.2)
BUN: 22 mg/dL (ref 8–23)
CALCIUM: 9.1 mg/dL (ref 8.9–10.3)
CHLORIDE: 103 mmol/L (ref 98–111)
CO2: 26 mmol/L (ref 22–32)
CREATININE: 1.52 mg/dL — AB (ref 0.61–1.24)
GFR calc Af Amer: 53 mL/min — ABNORMAL LOW (ref >60)
GFR calc non Af Amer: 46 mL/min — ABNORMAL LOW (ref >60)
Glucose, Bld: 138 mg/dL — ABNORMAL HIGH (ref 70–99)
POTASSIUM: 4.1 mmol/L (ref 3.5–5.1)
Sodium: 137 mmol/L (ref 135–145)
TOTAL PROTEIN: 6.5 g/dL (ref 6.5–8.1)

## 2018-05-01 LAB — TSH: TSH: 2.786 u[IU]/mL (ref 0.350–4.500)

## 2018-05-01 NOTE — Progress Notes (Addendum)
Patient ID: [name], male   DOB: 03/14/1951, 67 y.o.   MRN: [id number]    Primary Care Physician: [name], MD Referring Physician: Dr. [name] is a 67 y.o. male with a h/o afib that was first diagnosed 14 years ago. 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  He was loaded on amiodarone in the remote past and has been staying in SR. Last cardioversion was 09/2015 and has been in rhythm since then. He is in the afib clinic today for returning to afib on Sunday.  He currently is on warfarin for chadsvasc score of at least 2(htn, DM ).   He had a positive  sleep study and is on CPAP but has not used for the most of this year for "sinuses."  He is on warfarin but his INR's, have been more out of range than in range. As of 5/1, he was 1.7 Does not use alcohol, or tobacco products.  Echo performed 07/28/15 revealed normal systolic function, with moderate concentric hypertrophy, normal wall motion, with Left atrium 50 mm.   F/u in afib clinic, 6/3. Pt has returned to SR with increase of amiodarone. For some reason, his BP has been running higher recently. His PCP follows this.  F/u afib clinic, 11/6. He is having low afib burden. He now has Linq since 03/19/18.  Initial report early October did not show any afib. He continues on amiodarone.  Today, he denies symptoms of palpitations, chest pain, shortness of breath, orthopnea, PND, lower extremity edema, dizziness, presyncope, syncope, or neurologic sequela. The patient is tolerating medications without difficulties and is otherwise without complaint today.   Past Medical History:  Diagnosis Date  . CAD (coronary artery disease)    70% LCx stenosis, treated medically  . Diabetes mellitus   . Encounter for therapeutic drug monitoring 07/22/2013  . Essential hypertension 03/10/2014  . GERD (gastroesophageal reflux disease)   . Hyperlipidemia 03/10/2014  . Hypertension   .  Hypotension   . Hypothyroidism 03/10/2014  . Left atrial enlargement   . Obstructive sleep apnea 03/10/2014  . On Coumadin for atrial fibrillation 04/02/2013  . Persistent atrial fibrillation ([place])   . 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 Valere Dross and [facility]   . Sleep apnea   . 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 Uncomplicated type 2 diabetes mellitus ([place]) 03/10/2014   Past Surgical History:  Procedure Laterality Date  . BREAST SURGERY    . CARDIAC CATHETERIZATION    . CARDIOVERSION N/A 10/14/2015   Procedure: CARDIOVERSION;  Surgeon: Lelon Perla, MD;  Location: [facility] ENDOSCOPY;  Service: Cardiovascular;  Laterality: N/A;  . COLONOSCOPY WITH PROPOFOL N/A 06/03/2015   Procedure: COLONOSCOPY WITH PROPOFOL;  Surgeon: Laurence Spates, MD;  Location: [facility] ENDOSCOPY;  Service: Endoscopy;  Laterality: N/A;  . HOT HEMOSTASIS N/A 06/03/2015   Procedure: HOT HEMOSTASIS (ARGON PLASMA COAGULATION/BICAP);  Surgeon: Laurence Spates, MD;  Location: [facility] ENDOSCOPY;  Service: Endoscopy;  Laterality: N/A;  . LOOP RECORDER INSERTION N/A 03/19/2018   Procedure: LOOP RECORDER INSERTION;  Surgeon: Thompson Grayer, MD;  Location: [facility] CV LAB;  Service: Cardiovascular;  Laterality: N/A;    Current Outpatient Medications  Medication Sig Dispense Refill  . acetaminophen (TYLENOL) 500 MG tablet Take 500 mg by mouth 2 (two) times daily.    Marland Kitchen amiodarone (PACERONE) 200  MG tablet Take 200 mg by mouth at bedtime.     . dicyclomine (BENTYL) 20 MG tablet Take 20 mg by mouth 2 (two) times daily before a meal.     . docusate sodium (COLACE) 100 MG capsule Take 100 mg by mouth 2 (two) times daily as needed for mild constipation.     Marland Kitchen doxazosin (CARDURA) 4 MG tablet Take 4 mg by mouth at bedtime.     . fluorouracil (EFUDEX) 5 % cream Apply 1 application topically daily as needed (flaky skin on ears).     . hydrochlorothiazide (HYDRODIURIL)  25 MG tablet Take 25 mg by mouth daily.    Marland Kitchen levothyroxine (SYNTHROID, LEVOTHROID) 100 MCG tablet Take 100 mcg by mouth daily before breakfast.    . liothyronine (CYTOMEL) 5 MCG tablet Take 5 mcg by mouth daily before breakfast.     . metFORMIN (GLUCOPHAGE) 1000 MG tablet Take 1,000 mg by mouth 2 (two) times daily with a meal.    . metoprolol succinate (TOPROL-XL) 25 MG 24 hr tablet TAKE ONE TABLET BY MOUTH DAILY (Patient taking differently: Take 25 mg by mouth at bedtime. ) 90 tablet 1  . Multiple Vitamin (MULITIVITAMIN WITH MINERALS) TABS Take 1 tablet by mouth at bedtime.     . Omega-3 Fatty Acids (FISH OIL PO) Take 3 capsules by mouth daily.     . pravastatin (PRAVACHOL) 80 MG tablet Take 80 mg by mouth at bedtime.     . quinapril (ACCUPRIL) 40 MG tablet Take 60 mg by mouth See admin instructions. Take 1 tablet in AM, and 1/2 tablet in evening    . ranitidine (ZANTAC) 150 MG tablet Take 150 mg by mouth daily.    Marland Kitchen warfarin (COUMADIN) 5 MG tablet Take as directed by coumadin clinic (Patient taking differently: Take 2.5-5 mg by mouth See admin instructions. Take 5mg  daily except 2.5mg  on Sundays and Thursdays.) 90 tablet 0   No current facility-administered medications for this encounter.     Allergies  Allergen Reactions  . Nsaids Other (See Comments)    REACTION: PER MD REQUEST NOT TO TAKE    Social History   Socioeconomic History  . Marital status: Married    Spouse name: Not on file  . Number of children: Not on file  . Years of education: Not on file  . Highest education level: Not on file  Occupational History  . Not on file  Social Needs  . Financial resource strain: Not on file  . Food insecurity:    Worry: Not on file    Inability: Not on file  . Transportation needs:    Medical: Not on file    Non-medical: Not on file  Tobacco Use  . Smoking status: Never Smoker  . Smokeless tobacco: Never Used  Substance and Sexual Activity  . Alcohol use: No    Alcohol/week:  0.0 standard drinks  . Drug use: No  . Sexual activity: Not on file  Lifestyle  . Physical activity:    Days per week: Not on file    Minutes per session: Not on file  . Stress: Not on file  Relationships  . Social connections:    Talks on phone: Not on file    Gets together: Not on file    Attends religious service: Not on file    Active member of club or organization: Not on file    Attends meetings of clubs or organizations: Not on file    Relationship status:  Not on file  . Intimate partner violence:    Fear of current or ex partner: Not on file    Emotionally abused: Not on file    Physically abused: Not on file    Forced sexual activity: Not on file  Other Topics Concern  . Not on file  Social History Narrative  . Not on file    Family History  Problem Relation Age of Onset  . Coronary artery disease Father     ROS- All systems are reviewed and negative except as per the HPI above  Physical Exam: Vitals:   05/01/18 0943  BP: (!) 172/84  Pulse: (!) 49  Weight: 108 kg  Height: 6\' 4"  (1.93 m)    GEN- The patient is well appearing, alert and oriented x 3 today.   Head- normocephalic, atraumatic Eyes-  Sclera clear, conjunctiva pink Ears- hearing intact Oropharynx- clear Neck- supple, no JVP Lymph- no cervical lymphadenopathy Lungs- Clear to ausculation bilaterally, normal work of breathing Heart- regular rate and rhythm, no murmurs, rubs or gallops, PMI not laterally displaced GI- soft, NT, ND, + BS Extremities- no clubbing, cyanosis, trace edema MS- no significant deformity or atrophy Skin- no rash or lesion Psych- euthymic mood, full affect Neuro- strength and sensation are intact  EKG-  Sinus brady at 49  bpm, pr int 214 ms, qrs int 86 ms, qtc 413 ms Epic records reviewed  Assessment and Plan:  1. Symptomatic persistent Afib Has been staying in SR with amiodarone recently Did have some intermittent afib a couple  of months ago and Linq  implanted Continue Amiodarone 200 mg qd Cpap encouraged Continue metoprolol  Continue coumadin per coumadin clinic Tsh/cmet for amiodarone surveillance today  2. HTN  Elevated today F/u with PCP as he treats BP Avoid salt  F/u Dr. [name] in January per recall afib clinic as needed   Geroge Baseman. [place], [facility] [address] [place], [place] [address] [phone]

## 2018-05-01 NOTE — Addendum Note (Signed)
Encounter addended by: Sherran Needs, NP on: 05/01/2018 10:18 AM  Actions taken: Sign clinical note

## 2018-05-02 DIAGNOSIS — Z23 Encounter for immunization: Secondary | ICD-10-CM | POA: Diagnosis not present

## 2018-05-09 ENCOUNTER — Ambulatory Visit (INDEPENDENT_AMBULATORY_CARE_PROVIDER_SITE_OTHER): Payer: Medicare Other

## 2018-05-09 DIAGNOSIS — Z7901 Long term (current) use of anticoagulants: Secondary | ICD-10-CM | POA: Diagnosis not present

## 2018-05-09 DIAGNOSIS — I4891 Unspecified atrial fibrillation: Secondary | ICD-10-CM

## 2018-05-09 DIAGNOSIS — I480 Paroxysmal atrial fibrillation: Secondary | ICD-10-CM

## 2018-05-09 DIAGNOSIS — Z5181 Encounter for therapeutic drug level monitoring: Secondary | ICD-10-CM

## 2018-05-09 LAB — POCT INR: INR: 2.9 (ref 2.0–3.0)

## 2018-05-09 NOTE — Patient Instructions (Signed)
Description   Continue taking 1 tablet daily except 1/2 tablet on Sundays and Thursdays.  Recheck in 5 weeks. Remain consistent with leafy green veggies.  Call if scheduled for any procedures, surgery or medication changes at [phone]

## 2018-05-15 LAB — CUP PACEART REMOTE DEVICE CHECK
Implantable Pulse Generator Implant Date: 20190924
MDC IDC SESS DTM: 20191027113603

## 2018-05-21 ENCOUNTER — Other Ambulatory Visit: Payer: Self-pay | Admitting: Gastroenterology

## 2018-05-21 DIAGNOSIS — Z7984 Long term (current) use of oral hypoglycemic drugs: Secondary | ICD-10-CM | POA: Diagnosis not present

## 2018-05-21 DIAGNOSIS — Z8589 Personal history of malignant neoplasm of other organs and systems: Secondary | ICD-10-CM | POA: Diagnosis not present

## 2018-05-21 DIAGNOSIS — E039 Hypothyroidism, unspecified: Secondary | ICD-10-CM | POA: Diagnosis not present

## 2018-05-21 DIAGNOSIS — Z7901 Long term (current) use of anticoagulants: Secondary | ICD-10-CM | POA: Diagnosis not present

## 2018-05-21 DIAGNOSIS — E118 Type 2 diabetes mellitus with unspecified complications: Secondary | ICD-10-CM | POA: Diagnosis not present

## 2018-05-21 DIAGNOSIS — G4733 Obstructive sleep apnea (adult) (pediatric): Secondary | ICD-10-CM | POA: Diagnosis not present

## 2018-05-21 DIAGNOSIS — I480 Paroxysmal atrial fibrillation: Secondary | ICD-10-CM | POA: Diagnosis not present

## 2018-05-21 DIAGNOSIS — D126 Benign neoplasm of colon, unspecified: Secondary | ICD-10-CM | POA: Diagnosis not present

## 2018-05-22 ENCOUNTER — Telehealth: Payer: Self-pay | Admitting: *Deleted

## 2018-05-22 NOTE — Telephone Encounter (Signed)
   [facility] Pre-operative Risk Assessment    Request for surgical clearance:  1. What type of surgery is being performed? COLONOSCOPY   2. When is this surgery scheduled?  06/12/18   3. What type of clearance is required (medical clearance vs. Pharmacy clearance to hold med vs. Both)? PHARMACY  4. Are there any medications that need to be held prior to surgery and how long? COUMADIN X'S 4 DAYS   5. Practice name and name of physician performing surgery?  [facility] / DR. Oletta Lamas   6. What is your office phone number [phone]    7.   What is your office fax number [phone]  8.   Anesthesia type (None, local, MAC, general) ?  N/A   Jeanann Lewandowsky 05/22/2018, 1:10 PM  _________________________________________________________________   (provider comments below)

## 2018-05-22 NOTE — Telephone Encounter (Signed)
   Primary Cardiologist: Sinclair Grooms, MD  Chart reviewed as part of pre-operative protocol coverage. Given past medical history and time since last visit, based on ACC/AHA guidelines, [name] would be at acceptable risk for the planned procedure without further cardiovascular testing.   The patient take Coumadin for atrial fibrillation with a CHADS2VASc score of 4 (age, HTN, DM, CAD). Per pharmacy recommendations, it would be acceptable to hold warfarin for 5 days prior to procedure.  I will route this recommendation to the requesting party via Epic fax function and remove from pre-op pool.  Please call with questions.  Kathyrn Drown, NP 05/22/2018, 2:39 PM

## 2018-05-22 NOTE — Telephone Encounter (Signed)
Pt takes warfarin for afib with CHADS2VASc score of 4 (age, HTN, DM, CAD). Ok to hold warfarin for 5 days prior to procedure.

## 2018-05-27 ENCOUNTER — Ambulatory Visit (INDEPENDENT_AMBULATORY_CARE_PROVIDER_SITE_OTHER): Payer: Medicare Other

## 2018-05-27 DIAGNOSIS — I480 Paroxysmal atrial fibrillation: Secondary | ICD-10-CM

## 2018-05-27 LAB — CUP PACEART REMOTE DEVICE CHECK
Date Time Interrogation Session: 20191129113720
Implantable Pulse Generator Implant Date: 20190924

## 2018-05-27 NOTE — Progress Notes (Signed)
Carelink Summary Report / Loop Recorder 

## 2018-06-02 ENCOUNTER — Other Ambulatory Visit: Payer: Self-pay | Admitting: Interventional Cardiology

## 2018-06-11 ENCOUNTER — Other Ambulatory Visit: Payer: Self-pay

## 2018-06-11 ENCOUNTER — Encounter (HOSPITAL_COMMUNITY): Payer: Self-pay | Admitting: *Deleted

## 2018-06-12 ENCOUNTER — Ambulatory Visit (HOSPITAL_COMMUNITY): Payer: Medicare Other | Admitting: Anesthesiology

## 2018-06-12 ENCOUNTER — Encounter (HOSPITAL_COMMUNITY)
Admission: RE | Disposition: A | Discharge status: Home / Self Care | Payer: Self-pay | Source: Home / Self Care | Attending: Gastroenterology

## 2018-06-12 ENCOUNTER — Ambulatory Visit (HOSPITAL_COMMUNITY)
Admission: RE | Admit: 2018-06-12 | Discharge: 2018-06-12 | Disposition: A | Discharge status: Home / Self Care | Payer: Medicare Other | Attending: Gastroenterology | Admitting: Gastroenterology

## 2018-06-12 ENCOUNTER — Encounter (HOSPITAL_COMMUNITY): Payer: Self-pay | Admitting: *Deleted

## 2018-06-12 DIAGNOSIS — I2510 Atherosclerotic heart disease of native coronary artery without angina pectoris: Secondary | ICD-10-CM | POA: Insufficient documentation

## 2018-06-12 DIAGNOSIS — I10 Essential (primary) hypertension: Secondary | ICD-10-CM | POA: Diagnosis not present

## 2018-06-12 DIAGNOSIS — Z86010 Personal history of colonic polyps: Secondary | ICD-10-CM | POA: Diagnosis not present

## 2018-06-12 DIAGNOSIS — Z7984 Long term (current) use of oral hypoglycemic drugs: Secondary | ICD-10-CM | POA: Diagnosis not present

## 2018-06-12 DIAGNOSIS — Z79899 Other long term (current) drug therapy: Secondary | ICD-10-CM | POA: Insufficient documentation

## 2018-06-12 DIAGNOSIS — Z1211 Encounter for screening for malignant neoplasm of colon: Secondary | ICD-10-CM | POA: Diagnosis not present

## 2018-06-12 DIAGNOSIS — D126 Benign neoplasm of colon, unspecified: Secondary | ICD-10-CM | POA: Diagnosis not present

## 2018-06-12 DIAGNOSIS — Z5309 Procedure and treatment not carried out because of other contraindication: Secondary | ICD-10-CM | POA: Insufficient documentation

## 2018-06-12 DIAGNOSIS — E785 Hyperlipidemia, unspecified: Secondary | ICD-10-CM | POA: Insufficient documentation

## 2018-06-12 DIAGNOSIS — K219 Gastro-esophageal reflux disease without esophagitis: Secondary | ICD-10-CM | POA: Diagnosis not present

## 2018-06-12 DIAGNOSIS — Z7901 Long term (current) use of anticoagulants: Secondary | ICD-10-CM | POA: Insufficient documentation

## 2018-06-12 DIAGNOSIS — E119 Type 2 diabetes mellitus without complications: Secondary | ICD-10-CM | POA: Diagnosis not present

## 2018-06-12 DIAGNOSIS — G4733 Obstructive sleep apnea (adult) (pediatric): Secondary | ICD-10-CM | POA: Insufficient documentation

## 2018-06-12 DIAGNOSIS — R195 Other fecal abnormalities: Secondary | ICD-10-CM | POA: Diagnosis not present

## 2018-06-12 DIAGNOSIS — I480 Paroxysmal atrial fibrillation: Secondary | ICD-10-CM | POA: Insufficient documentation

## 2018-06-12 DIAGNOSIS — Z888 Allergy status to other drugs, medicaments and biological substances status: Secondary | ICD-10-CM | POA: Insufficient documentation

## 2018-06-12 HISTORY — PX: COLONOSCOPY WITH PROPOFOL: SHX5780

## 2018-06-12 LAB — GLUCOSE, CAPILLARY: Glucose-Capillary: 82 mg/dL (ref 70–99)

## 2018-06-12 SURGERY — COLONOSCOPY WITH PROPOFOL
Anesthesia: Monitor Anesthesia Care

## 2018-06-12 MED ORDER — LACTATED RINGERS IV SOLN
INTRAVENOUS | Status: DC
  Administered 2018-06-12: 1000 mL via INTRAVENOUS

## 2018-06-12 MED ORDER — PROPOFOL 10 MG/ML IV BOLUS
INTRAVENOUS | Status: AC
  Filled 2018-06-12: qty 20

## 2018-06-12 MED ORDER — PROPOFOL 500 MG/50ML IV EMUL
INTRAVENOUS | Status: DC | PRN
  Administered 2018-06-12: 150 ug/kg/min via INTRAVENOUS

## 2018-06-12 MED ORDER — PROPOFOL 500 MG/50ML IV EMUL
INTRAVENOUS | Status: DC | PRN
  Administered 2018-06-12 (×2): 30 mg via INTRAVENOUS

## 2018-06-12 MED ORDER — PROPOFOL 10 MG/ML IV BOLUS
INTRAVENOUS | Status: AC
  Filled 2018-06-12: qty 40

## 2018-06-12 MED ORDER — SODIUM CHLORIDE 0.9 % IV SOLN: INTRAVENOUS | Status: DC

## 2018-06-12 SURGICAL SUPPLY — 21 items

## 2018-06-12 NOTE — Op Note (Signed)
[facility] Patient Name: [name] Procedure Date: 06/12/2018 MRN: [id number] Attending MD: Nancy Fetter Dr., MD Date of Birth: Apr 29, 1951 CSN: [id number] Age: 67 Admit Type: Inpatient Procedure:                Colonoscopy Indications:              High risk colon cancer surveillance: Personal                            history of colonic polyps, Last colonoscopy: 2016 Providers:                [name] Dr., MD, Cleda Daub, RN,                            Alan Mulder, Technician Referring MD:              Medicines:                Monitored Anesthesia Care Complications:            No immediate complications. Estimated Blood Loss:     Estimated blood loss: none. Procedure:                Pre-Anesthesia Assessment:                           - Prior to the procedure, a History and Physical                            was performed, and patient medications and                            allergies were reviewed. The patient's tolerance of                            previous anesthesia was also reviewed. The risks                            and benefits of the procedure and the sedation                            options and risks were discussed with the patient.                            All questions were answered, and informed consent                            was obtained. Prior Anticoagulants: The patient has                            taken Coumadin (warfarin), last dose was 5 days                            prior to procedure. ASA Grade Assessment: III - A  patient with severe systemic disease. After                            reviewing the risks and benefits, the patient was                            deemed in satisfactory condition to undergo the                            procedure.                           After obtaining informed consent, the colonoscope                            was passed under direct vision.  Throughout the                            procedure, the patient's blood pressure, pulse, and                            oxygen saturations were monitored continuously. The                            [id number]-H190DL ([id number]) Olympus peds colonscope was                            introduced through the anus with the intention of                            advancing to the cecum. The scope was advanced to                            the ascending colon before the procedure was                            aborted. Medications were given. The colonoscopy                            was performed without difficulty. The patient                            tolerated the procedure well. The quality of the                            bowel preparation was unsatisfactory. The rectum                            was photographed. Scope In: 1:26:23 PM Scope Out: 1:56:23 PM Total Procedure Duration: 0 hours 30 minutes 0 seconds  Findings:      The entire examined colon appeared normal. We advanced to the ascending       colon and at this point the prep was so poor the procedure was       terminated. The  left colon was actually seen pretty well. Impression:               - Preparation of the colon was unsatisfactory.                           - The entire examined colon is normal.                           - No specimens collected.                           - Personal history of colonic polyps. Moderate Sedation:      See anesthesia note, no moderate sedation. Recommendation:           - Patient has a contact number available for                            emergencies. The signs and symptoms of potential                            delayed complications were discussed with the                            patient. Return to normal activities tomorrow.                            Written discharge instructions were provided to the                            patient.                           - Resume previous  diet.                           - Continue present medications.                           - Perform a virtual colonoscopy at appointment to                            be scheduled.                           - Resume Coumadin (warfarin) at prior dose today. Procedure Code(s):        --- Professional ---                           L7989, 53, Colorectal cancer screening; colonoscopy                            on individual at high risk Diagnosis Code(s):        --- Professional ---                           Z86.010, Personal history of colonic polyps CPT copyright 2018 American  Medical Association. All rights reserved. The codes documented in this report are preliminary and upon coder review may  be revised to meet current compliance requirements. Nancy Fetter Dr., MD 06/12/2018 2:06:23 PM This report has been signed electronically. Number of Addenda: 0

## 2018-06-12 NOTE — Anesthesia Preprocedure Evaluation (Addendum)
Anesthesia Evaluation  Patient identified by MRN, date of birth, ID band Patient awake    Reviewed: Allergy & Precautions, NPO status , Patient's Chart, lab work & pertinent test results, reviewed documented beta blocker date and time   Airway Mallampati: III  TM Distance: >3 FB Neck ROM: Full    Dental  (+) Edentulous Upper, Edentulous Lower   Pulmonary sleep apnea and Continuous Positive Airway Pressure Ventilation ,  Tonsillar cancer Pharyngeal cancer    Pulmonary exam normal breath sounds clear to auscultation       Cardiovascular hypertension, Pt. on medications and Pt. on home beta blockers + CAD  Normal cardiovascular exam+ dysrhythmias Atrial Fibrillation  Rhythm:Regular Rate:Normal  ECG: SB, rate 49. Sinus bradycardia with 1st degree A-V block  Sees cardiologist [name])   Neuro/Psych negative neurological ROS  negative psych ROS   GI/Hepatic Neg liver ROS, GERD  Medicated and Controlled,  Endo/Other  diabetes, Oral Hypoglycemic AgentsHypothyroidism   Renal/GU negative Renal ROS     Musculoskeletal negative musculoskeletal ROS (+)   Abdominal   Peds  Hematology HLD   Anesthesia Other Findings Tubulovillous adenoma of colon   Reproductive/Obstetrics                            Anesthesia Physical Anesthesia Plan  ASA: III  Anesthesia Plan: MAC   Post-op Pain Management:    Induction: Intravenous  PONV Risk Score and Plan: 1 and Propofol infusion and Treatment may vary due to age or medical condition  Airway Management Planned: Natural Airway  Additional Equipment:   Intra-op Plan:   Post-operative Plan:   Informed Consent: I have reviewed the patients History and Physical, chart, labs and discussed the procedure including the risks, benefits and alternatives for the proposed anesthesia with the patient or authorized representative who has indicated his/her understanding  and acceptance.     Plan Discussed with: CRNA  Anesthesia Plan Comments:         Anesthesia Quick Evaluation

## 2018-06-12 NOTE — Discharge Instructions (Signed)
Colonoscopy, Adult, Care After This sheet gives you information about how to care for yourself after your procedure. Your doctor may also give you more specific instructions. If you have problems or questions, call your doctor. What can I expect after the procedure? After the procedure, it is common to have:  A small amount of blood in your poop for 24 hours.  Some gas.  Mild cramping or bloating in your belly. Follow these instructions at home: General instructions  For the first 24 hours after the procedure: ? Do not drive or use machinery. ? Do not sign important documents. ? Do not drink alcohol. ? Do your daily activities more slowly than normal. ? Eat foods that are soft and easy to digest.  Take over-the-counter or prescription medicines only as told by your doctor. To help cramping and bloating:   Try walking around.  Put heat on your belly (abdomen) as told by your doctor. Use a heat source that your doctor recommends, such as a moist heat pack or a heating pad. ? Put a towel between your skin and the heat source. ? Leave the heat on for 20-30 minutes. ? Remove the heat if your skin turns bright red. This is especially important if you cannot feel pain, heat, or cold. You can get burned. Eating and drinking   Drink enough fluid to keep your pee (urine) clear or pale yellow.  Return to your normal diet as told by your doctor. Avoid heavy or fried foods that are hard to digest.  Avoid drinking alcohol for as long as told by your doctor. Contact a doctor if:  You have blood in your poop (stool) 2-3 days after the procedure. Get help right away if:  You have more than a small amount of blood in your poop.  You see large clumps of tissue (blood clots) in your poop.  Your belly is swollen.  You feel sick to your stomach (nauseous).  You throw up (vomit).  You have a fever.  You have belly pain that gets worse, and medicine does not help your  pain. Summary  After the procedure, it is common to have a small amount of blood in your poop. You may also have mild cramping and bloating in your belly.  For the first 24 hours after the procedure, do not drive or use machinery, do not sign important documents, and do not drink alcohol.  Get help right away if you have a lot of blood in your poop, feel sick to your stomach, have a fever, or have more belly pain. This information is not intended to replace advice given to you by your health care provider. Make sure you discuss any questions you have with your health care provider. Document Released: 07/15/2010 Document Revised: 04/12/2017 Document Reviewed: 03/06/2016 Elsevier Interactive Patient Education  2019 [place]

## 2018-06-12 NOTE — Anesthesia Postprocedure Evaluation (Signed)
Anesthesia Post Note  Patient: [name]  Procedure(s) Performed: COLONOSCOPY WITH PROPOFOL (N/A )     Patient location during evaluation: PACU Anesthesia Type: MAC Level of consciousness: awake and alert Pain management: pain level controlled Vital Signs Assessment: post-procedure vital signs reviewed and stable Respiratory status: spontaneous breathing, nonlabored ventilation, respiratory function stable and patient connected to nasal cannula oxygen Cardiovascular status: stable and blood pressure returned to baseline Postop Assessment: no apparent nausea or vomiting Anesthetic complications: no    Last Vitals:  Vitals:   06/12/18 1410 06/12/18 1420  BP: (!) 146/66 (!) 152/79  Pulse: (!) 49   Resp: 17 14  Temp:    SpO2: 100% 100%    Last Pain:  Vitals:   06/12/18 1420  TempSrc:   PainSc: 0-No pain                 Kirstina Leinweber P Shia Eber

## 2018-06-12 NOTE — Transfer of Care (Signed)
Immediate Anesthesia Transfer of Care Note  Patient: [name]  Procedure(s) Performed: COLONOSCOPY WITH PROPOFOL (N/A )  Patient Location: PACU  Anesthesia Type:MAC  Level of Consciousness: sedated, patient cooperative and responds to stimulation  Airway & Oxygen Therapy: Patient Spontanous Breathing and Patient connected to face mask oxygen  Post-op Assessment: Report given to RN and Post -op Vital signs reviewed and stable  Post vital signs: Reviewed and stable  Last Vitals:  Vitals Value Taken Time  BP    Temp    Pulse 48 06/12/2018  2:04 PM  Resp 17 06/12/2018  2:04 PM  SpO2 100 % 06/12/2018  2:04 PM  Vitals shown include unvalidated device data.  Last Pain:  Vitals:   06/12/18 1229  TempSrc: Oral  PainSc: 0-No pain         Complications: No apparent anesthesia complications

## 2018-06-12 NOTE — H&P (Signed)
Subjective:   Patient is a 67 y.o. male presents with history of villous adenoma the colon.  He has A. fib and is anticoagulated has not had Coumadin now for about 5 days.  This procedure is done to evaluate for further polyps.  This is a 3-year follow-up.. Procedure including risks and benefits discussed in office.  Patient Active Problem List   Diagnosis Date Noted  . Essential hypertension 03/10/2014  . Uncomplicated type 2 diabetes mellitus ([place]) 03/10/2014  . Obstructive sleep apnea 03/10/2014  . Pharyngeal cancer ([place]) 03/10/2014  . Hyperlipidemia 03/10/2014  . Hypothyroidism 03/10/2014  . Encounter for therapeutic drug monitoring 07/22/2013  . On Coumadin for atrial fibrillation ([place]) 04/02/2013   Past Medical History:  Diagnosis Date  . CAD (coronary artery disease)    70% LCx stenosis, treated medically  . Diabetes mellitus   . Encounter for therapeutic drug monitoring 07/22/2013  . Essential hypertension 03/10/2014  . GERD (gastroesophageal reflux disease)   . Hyperlipidemia 03/10/2014  . Hypertension   . Hypotension   . Hypothyroidism 03/10/2014  . Left atrial enlargement   . Obstructive sleep apnea 03/10/2014  . On Coumadin for atrial fibrillation ([place]) 04/02/2013  . Persistent atrial fibrillation   . 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 Valere Dross and [facility]   . Sleep apnea   . 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 Uncomplicated type 2 diabetes mellitus ([place]) 03/10/2014    Past Surgical History:  Procedure Laterality Date  . BREAST SURGERY    . CARDIAC CATHETERIZATION    . CARDIOVERSION N/A 10/14/2015   Procedure: CARDIOVERSION;  Surgeon: Lelon Perla, MD;  Location: [facility] ENDOSCOPY;  Service: Cardiovascular;  Laterality: N/A;  . COLONOSCOPY WITH PROPOFOL N/A 06/03/2015   Procedure: COLONOSCOPY WITH PROPOFOL;  Surgeon: Laurence Spates, MD;  Location: [facility] ENDOSCOPY;  Service: Endoscopy;   Laterality: N/A;  . HOT HEMOSTASIS N/A 06/03/2015   Procedure: HOT HEMOSTASIS (ARGON PLASMA COAGULATION/BICAP);  Surgeon: Laurence Spates, MD;  Location: [facility] ENDOSCOPY;  Service: Endoscopy;  Laterality: N/A;  . LOOP RECORDER INSERTION N/A 03/19/2018   Procedure: LOOP RECORDER INSERTION;  Surgeon: Thompson Grayer, MD;  Location: [facility] CV LAB;  Service: Cardiovascular;  Laterality: N/A;    Medications Prior to Admission  Medication Sig Dispense Refill Last Dose  . acetaminophen (TYLENOL) 500 MG tablet Take 500 mg by mouth 2 (two) times daily.   Taking  . amiodarone (PACERONE) 200 MG tablet Take 200 mg by mouth at bedtime.    06/11/2018 at Unknown time  . dicyclomine (BENTYL) 20 MG tablet Take 20 mg by mouth 2 (two) times daily before a meal.    06/11/2018 at Unknown time  . docusate sodium (COLACE) 100 MG capsule Take 100 mg by mouth 2 (two) times daily as needed for mild constipation.    Past Month at Unknown time  . doxazosin (CARDURA) 4 MG tablet Take 4 mg by mouth at bedtime.    06/11/2018 at Unknown time  . fluorouracil (EFUDEX) 5 % cream Apply 1 application topically daily as needed (flaky skin on ears).    Past Week at Unknown time  . hydrochlorothiazide (HYDRODIURIL) 25 MG tablet Take 25 mg by mouth daily.   06/11/2018 at Unknown time  . levothyroxine (SYNTHROID, LEVOTHROID) 100 MCG tablet Take 100 mcg by mouth daily before breakfast.   06/12/2018 at Unknown time  . liothyronine (CYTOMEL) 5 MCG tablet Take 5 mcg by  mouth daily before breakfast.    06/12/2018 at Unknown time  . metFORMIN (GLUCOPHAGE) 1000 MG tablet Take 1,000 mg by mouth 2 (two) times daily with a meal.   06/11/2018 at Unknown time  . metoprolol succinate (TOPROL-XL) 25 MG 24 hr tablet TAKE ONE TABLET BY MOUTH DAILY (Patient taking differently: Take 25 mg by mouth at bedtime. ) 90 tablet 1 06/11/2018 at 2200  . Multiple Vitamin (MULITIVITAMIN WITH MINERALS) TABS Take 1 tablet by mouth at bedtime.    Past Week at Unknown time   . pravastatin (PRAVACHOL) 80 MG tablet Take 80 mg by mouth at bedtime.    06/11/2018 at Unknown time  . quinapril (ACCUPRIL) 40 MG tablet Take 20-40 mg by mouth See admin instructions. Take 1 tablet in AM, and 1/2 tablet in evening   06/11/2018 at Unknown time  . warfarin (COUMADIN) 5 MG tablet TAKE AS DIRECTED BY COUMADIN CLINIC (Patient taking differently: Take 5 mg by mouth See admin instructions. Take as directed by coumadin clinic SUNDAY AND Thursday TAKING 2.5 MG, ALL OTHER DAYS IS 5 MG) 90 tablet 0 06/06/2018   Allergies  Allergen Reactions  . Nsaids Other (See Comments)    REACTION: PER MD REQUEST NOT TO TAKE    Social History   Tobacco Use  . Smoking status: Never Smoker  . Smokeless tobacco: Never Used  Substance Use Topics  . Alcohol use: No    Alcohol/week: 0.0 standard drinks    Family History  Problem Relation Age of Onset  . Coronary artery disease Father      Objective:   Patient Vitals for the past 8 hrs:  BP Temp Temp src Pulse Resp SpO2 Height Weight  06/12/18 1229 (!) 170/68 98.2 F (36.8 C) Oral 64 16 100 % 6\' 4"  (1.93 m) 108.9 kg   No intake/output data recorded. No intake/output data recorded.   See MD Preop evaluation      Assessment:   1.  History of villous adenoma at the hepatic flexure needs surveillance colonoscopy. 2.  History of head and neck cancer 3.  Type 2 diabetes 4.  PAF anticoagulated.  Coumadin has been on hold for about 5 days 5.  Obstructive sleep apnea  Plan:   We will proceed with colonoscopy with propofol sedation here in the hospital due to all of his other problems.  Have discussed this in detail with the patient.

## 2018-06-13 ENCOUNTER — Encounter (HOSPITAL_COMMUNITY): Payer: Self-pay | Admitting: Gastroenterology

## 2018-06-13 ENCOUNTER — Other Ambulatory Visit: Payer: Self-pay | Admitting: Gastroenterology

## 2018-06-13 DIAGNOSIS — Z86010 Personal history of colonic polyps: Secondary | ICD-10-CM

## 2018-06-17 ENCOUNTER — Ambulatory Visit (INDEPENDENT_AMBULATORY_CARE_PROVIDER_SITE_OTHER): Payer: Medicare Other | Admitting: Podiatry

## 2018-06-17 DIAGNOSIS — M79674 Pain in right toe(s): Secondary | ICD-10-CM | POA: Diagnosis not present

## 2018-06-17 DIAGNOSIS — M79675 Pain in left toe(s): Secondary | ICD-10-CM

## 2018-06-17 DIAGNOSIS — B351 Tinea unguium: Secondary | ICD-10-CM | POA: Diagnosis not present

## 2018-06-17 NOTE — Patient Instructions (Signed)
Diabetes Mellitus and Foot Care Foot care is an important part of your health, especially when you have diabetes. Diabetes may cause you to have problems because of poor blood flow (circulation) to your feet and legs, which can cause your skin to:  Become thinner and drier.  Break more easily.  Heal more slowly.  Peel and crack. You may also have nerve damage (neuropathy) in your legs and feet, causing decreased feeling in them. This means that you may not notice minor injuries to your feet that could lead to more serious problems. Noticing and addressing any potential problems early is the best way to prevent future foot problems. How to care for your feet Foot hygiene  Wash your feet daily with warm water and mild soap. Do not use hot water. Then, pat your feet and the areas between your toes until they are completely dry. Do not soak your feet as this can dry your skin.  Trim your toenails straight across. Do not dig under them or around the cuticle. File the edges of your nails with an emery board or nail file.  Apply a moisturizing lotion or petroleum jelly to the skin on your feet and to dry, brittle toenails. Use lotion that does not contain alcohol and is unscented. Do not apply lotion between your toes. Shoes and socks  Wear clean socks or stockings every day. Make sure they are not too tight. Do not wear knee-high stockings since they may decrease blood flow to your legs.  Wear shoes that fit properly and have enough cushioning. Always look in your shoes before you put them on to be sure there are no objects inside.  To break in new shoes, wear them for just a few hours a day. This prevents injuries on your feet. Wounds, scrapes, corns, and calluses  Check your feet daily for blisters, cuts, bruises, sores, and redness. If you cannot see the bottom of your feet, use a mirror or ask someone for help.  Do not cut corns or calluses or try to remove them with medicine.  If you  find a minor scrape, cut, or break in the skin on your feet, keep it and the skin around it clean and dry. You may clean these areas with mild soap and water. Do not clean the area with peroxide, alcohol, or iodine.  If you have a wound, scrape, corn, or callus on your foot, look at it several times a day to make sure it is healing and not infected. Check for: ? Redness, swelling, or pain. ? Fluid or blood. ? Warmth. ? Pus or a bad smell. General instructions  Do not cross your legs. This may decrease blood flow to your feet.  Do not use heating pads or hot water bottles on your feet. They may burn your skin. If you have lost feeling in your feet or legs, you may not know this is happening until it is too late.  Protect your feet from hot and cold by wearing shoes, such as at the beach or on hot pavement.  Schedule a complete foot exam at least once a year (annually) or more often if you have foot problems. If you have foot problems, report any cuts, sores, or bruises to your health care provider immediately. Contact a health care provider if:  You have a medical condition that increases your risk of infection and you have any cuts, sores, or bruises on your feet.  You have an injury that is not   healing.  You have redness on your legs or feet.  You feel burning or tingling in your legs or feet.  You have pain or cramps in your legs and feet.  Your legs or feet are numb.  Your feet always feel cold.  You have pain around a toenail. Get help right away if:  You have a wound, scrape, corn, or callus on your foot and: ? You have pain, swelling, or redness that gets worse. ? You have fluid or blood coming from the wound, scrape, corn, or callus. ? Your wound, scrape, corn, or callus feels warm to the touch. ? You have pus or a bad smell coming from the wound, scrape, corn, or callus. ? You have a fever. ? You have a red line going up your leg. Summary  Check your feet every day  for cuts, sores, red spots, swelling, and blisters.  Moisturize feet and legs daily.  Wear shoes that fit properly and have enough cushioning.  If you have foot problems, report any cuts, sores, or bruises to your health care provider immediately.  Schedule a complete foot exam at least once a year (annually) or more often if you have foot problems. This information is not intended to replace advice given to you by your health care provider. Make sure you discuss any questions you have with your health care provider. Document Released: 06/09/2000 Document Revised: 07/25/2017 Document Reviewed: 07/14/2016 Elsevier Interactive Patient Education  2019 [employer]  Onychomycosis/Fungal Toenails  WHAT IS IT? An infection that lies within the keratin of your nail plate that is caused by a fungus.  WHY ME? Fungal infections affect all ages, sexes, races, and creeds.  There may be many factors that predispose you to a fungal infection such as age, coexisting medical conditions such as diabetes, or an autoimmune disease; stress, medications, fatigue, genetics, etc.  Bottom line: fungus thrives in a warm, moist environment and your shoes offer such a location.  IS IT CONTAGIOUS? Theoretically, yes.  You do not want to share shoes, nail clippers or files with someone who has fungal toenails.  Walking around barefoot in the same room or sleeping in the same bed is unlikely to transfer the organism.  It is important to realize, however, that fungus can spread easily from one nail to the next on the same foot.  HOW DO WE TREAT THIS?  There are several ways to treat this condition.  Treatment may depend on many factors such as age, medications, pregnancy, liver and kidney conditions, etc.  It is best to ask your doctor which options are available to you.  1. No treatment.   Unlike many other medical concerns, you can live with this condition.  However for many people this can be a painful condition and  may lead to ingrown toenails or a bacterial infection.  It is recommended that you keep the nails cut short to help reduce the amount of fungal nail. 2. Topical treatment.  These range from herbal remedies to prescription strength nail lacquers.  About 40-50% effective, topicals require twice daily application for approximately 9 to 12 months or until an entirely new nail has grown out.  The most effective topicals are medical grade medications available through physicians offices. 3. Oral antifungal medications.  With an 80-90% cure rate, the most common oral medication requires 3 to 4 months of therapy and stays in your system for a year as the new nail grows out.  Oral antifungal medications do require   blood work to make sure it is a safe drug for you.  A liver function panel will be performed prior to starting the medication and after the first month of treatment.  It is important to have the blood work performed to avoid any harmful side effects.  In general, this medication safe but blood work is required. 4. Laser Therapy.  This treatment is performed by applying a specialized laser to the affected nail plate.  This therapy is noninvasive, fast, and non-painful.  It is not covered by insurance and is therefore, out of pocket.  The results have been very good with a 80-95% cure rate.  The [facility] is the only practice in the area to offer this therapy. 5. Permanent Nail Avulsion.  Removing the entire nail so that a new nail will not grow back. 

## 2018-06-18 ENCOUNTER — Ambulatory Visit: Payer: Medicare Other | Admitting: Podiatry

## 2018-06-20 ENCOUNTER — Ambulatory Visit (INDEPENDENT_AMBULATORY_CARE_PROVIDER_SITE_OTHER): Payer: Medicare Other | Admitting: Pharmacist

## 2018-06-20 DIAGNOSIS — Z5181 Encounter for therapeutic drug level monitoring: Secondary | ICD-10-CM | POA: Diagnosis not present

## 2018-06-20 DIAGNOSIS — I480 Paroxysmal atrial fibrillation: Secondary | ICD-10-CM

## 2018-06-20 LAB — POCT INR: INR: 1.5 — AB (ref 2.0–3.0)

## 2018-06-20 NOTE — Patient Instructions (Signed)
Description   Take 1 tablet today and 1.5 tablet tomorrow, then continue taking 1 tablet daily except 1/2 tablet on Sundays and Thursdays.  Recheck INR in 10 days.  Call if scheduled for any procedures, surgery or medication changes at [phone]

## 2018-06-21 ENCOUNTER — Other Ambulatory Visit: Payer: Self-pay | Admitting: Interventional Cardiology

## 2018-06-27 ENCOUNTER — Ambulatory Visit (INDEPENDENT_AMBULATORY_CARE_PROVIDER_SITE_OTHER): Payer: Medicare Other

## 2018-06-27 DIAGNOSIS — I480 Paroxysmal atrial fibrillation: Secondary | ICD-10-CM | POA: Diagnosis not present

## 2018-06-27 LAB — CUP PACEART REMOTE DEVICE CHECK
Date Time Interrogation Session: 20200102133502
Implantable Pulse Generator Implant Date: 20190924

## 2018-06-27 NOTE — Telephone Encounter (Signed)
Spoke w/ pt and informed him that his home monitor sends the remote transmission while he sleeps. Pt verbalized understanding.

## 2018-06-27 NOTE — Progress Notes (Signed)
Carelink Summary Report / Loop Recorder 

## 2018-07-02 ENCOUNTER — Encounter: Payer: Self-pay | Admitting: Podiatry

## 2018-07-02 ENCOUNTER — Ambulatory Visit (INDEPENDENT_AMBULATORY_CARE_PROVIDER_SITE_OTHER): Payer: Medicare Other

## 2018-07-02 DIAGNOSIS — Z7901 Long term (current) use of anticoagulants: Secondary | ICD-10-CM

## 2018-07-02 DIAGNOSIS — I4891 Unspecified atrial fibrillation: Secondary | ICD-10-CM | POA: Diagnosis not present

## 2018-07-02 DIAGNOSIS — I480 Paroxysmal atrial fibrillation: Secondary | ICD-10-CM

## 2018-07-02 DIAGNOSIS — Z5181 Encounter for therapeutic drug level monitoring: Secondary | ICD-10-CM

## 2018-07-02 LAB — POCT INR: INR: 2.8 (ref 2.0–3.0)

## 2018-07-02 NOTE — Patient Instructions (Signed)
Please continue taking 1 tablet daily except 1/2 tablet on Sundays and Thursdays.  Recheck INR in 3 weeks. Call if scheduled for any procedures, surgery or medication changes at [phone]

## 2018-07-02 NOTE — Progress Notes (Signed)
Subjective: [name] presents today with painful, thick toenails 1-5 b/l that he cannot cut and which interfere with daily activities.  Pain is aggravated when wearing enclosed shoe gear.  Frank Fess, MD    Current Outpatient Medications:  .  acetaminophen (TYLENOL) 500 MG tablet, Take 500 mg by mouth 2 (two) times daily., Disp: , Rfl:  .  dicyclomine (BENTYL) 20 MG tablet, Take 20 mg by mouth 2 (two) times daily before a meal. , Disp: , Rfl:  .  docusate sodium (COLACE) 100 MG capsule, Take 100 mg by mouth 2 (two) times daily as needed for mild constipation. , Disp: , Rfl:  .  doxazosin (CARDURA) 4 MG tablet, Take 4 mg by mouth at bedtime. , Disp: , Rfl:  .  fluorouracil (EFUDEX) 5 % cream, Apply 1 application topically daily as needed (flaky skin on ears). , Disp: , Rfl:  .  hydrochlorothiazide (HYDRODIURIL) 25 MG tablet, Take 25 mg by mouth daily., Disp: , Rfl:  .  levothyroxine (SYNTHROID, LEVOTHROID) 100 MCG tablet, Take 100 mcg by mouth daily before breakfast., Disp: , Rfl:  .  liothyronine (CYTOMEL) 5 MCG tablet, Take 5 mcg by mouth daily before breakfast. , Disp: , Rfl:  .  metFORMIN (GLUCOPHAGE) 1000 MG tablet, Take 1,000 mg by mouth 2 (two) times daily with a meal., Disp: , Rfl:  .  metoprolol succinate (TOPROL-XL) 25 MG 24 hr tablet, TAKE ONE TABLET BY MOUTH DAILY (Patient taking differently: Take 25 mg by mouth at bedtime. ), Disp: 90 tablet, Rfl: 1 .  Multiple Vitamin (MULITIVITAMIN WITH MINERALS) TABS, Take 1 tablet by mouth at bedtime. , Disp: , Rfl:  .  pravastatin (PRAVACHOL) 80 MG tablet, Take 80 mg by mouth at bedtime. , Disp: , Rfl:  .  quinapril (ACCUPRIL) 40 MG tablet, Take 20-40 mg by mouth See admin instructions. Take 1 tablet in AM, and 1/2 tablet in evening, Disp: , Rfl:  .  warfarin (COUMADIN) 5 MG tablet, TAKE AS DIRECTED BY COUMADIN CLINIC (Patient taking differently: Take 5 mg by mouth See admin instructions. Take as directed by coumadin clinic SUNDAY AND  Thursday TAKING 2.5 MG, ALL OTHER DAYS IS 5 MG), Disp: 90 tablet, Rfl: 0 .  amiodarone (PACERONE) 200 MG tablet, TAKE ONE TABLET (200MG  TOTAL) BY MOUTH DAILY, Disp: 90 tablet, Rfl: 2  Allergies  Allergen Reactions  . Nsaids Other (See Comments)    REACTION: PER MD REQUEST NOT TO TAKE    Objective:  Vascular Examination: Capillary refill time immediate x 10 digits Dorsalis pedis and Posterior tibial pulses palpable b/l Digital hair present x 10 digits Skin temperature gradient WNL b/l  Dermatological Examination: Skin with normal turgor, texture and tone b/l  Toenails 1-5 b/l discolored, thick, dystrophic with subungual debris and pain with palpation to nailbeds due to thickness of nails.  No open wounds.  No interdigital macerations.  Musculoskeletal: Muscle strength 5/5 to all LE muscle groups  Hallux malleus right hallux  Hammertoes 2-5 b/l  Neurological: Sensation intact with 10 gram monofilament. Vibratory sensation intact.  Assessment: Painful onychomycosis toenails 1-5 b/l   Plan: 1. Toenails 1-5 b/l were debrided in length and girth without iatrogenic bleeding. 2. Patient to continue soft, supportive shoe gear 3. Patient to report any pedal injuries to medical professional immediately. 4. Follow up 3 months. Patient/POA to call should there be a concern in the interim.

## 2018-07-09 DIAGNOSIS — E113393 Type 2 diabetes mellitus with moderate nonproliferative diabetic retinopathy without macular edema, bilateral: Secondary | ICD-10-CM | POA: Diagnosis not present

## 2018-07-09 DIAGNOSIS — H2513 Age-related nuclear cataract, bilateral: Secondary | ICD-10-CM | POA: Diagnosis not present

## 2018-07-22 ENCOUNTER — Ambulatory Visit (INDEPENDENT_AMBULATORY_CARE_PROVIDER_SITE_OTHER): Payer: Medicare Other | Admitting: *Deleted

## 2018-07-22 DIAGNOSIS — I480 Paroxysmal atrial fibrillation: Secondary | ICD-10-CM

## 2018-07-22 DIAGNOSIS — I4891 Unspecified atrial fibrillation: Secondary | ICD-10-CM

## 2018-07-22 DIAGNOSIS — Z5181 Encounter for therapeutic drug level monitoring: Secondary | ICD-10-CM | POA: Diagnosis not present

## 2018-07-22 DIAGNOSIS — Z7901 Long term (current) use of anticoagulants: Secondary | ICD-10-CM

## 2018-07-22 LAB — POCT INR: INR: 3.5 — AB (ref 2.0–3.0)

## 2018-07-22 NOTE — Patient Instructions (Signed)
Description   Do not take any Coumadin today then continue taking 1 tablet daily except 1/2 tablet on Sundays and Thursdays.  Recheck INR in 3 weeks. Call if scheduled for any procedures, surgery or medication changes at [phone]

## 2018-07-29 ENCOUNTER — Ambulatory Visit (INDEPENDENT_AMBULATORY_CARE_PROVIDER_SITE_OTHER): Payer: Medicare Other

## 2018-07-29 DIAGNOSIS — I480 Paroxysmal atrial fibrillation: Secondary | ICD-10-CM | POA: Diagnosis not present

## 2018-08-01 DIAGNOSIS — Z125 Encounter for screening for malignant neoplasm of prostate: Secondary | ICD-10-CM | POA: Diagnosis not present

## 2018-08-01 DIAGNOSIS — Z0000 Encounter for general adult medical examination without abnormal findings: Secondary | ICD-10-CM | POA: Diagnosis not present

## 2018-08-01 DIAGNOSIS — E782 Mixed hyperlipidemia: Secondary | ICD-10-CM | POA: Diagnosis not present

## 2018-08-01 DIAGNOSIS — I25119 Atherosclerotic heart disease of native coronary artery with unspecified angina pectoris: Secondary | ICD-10-CM | POA: Diagnosis not present

## 2018-08-01 DIAGNOSIS — E039 Hypothyroidism, unspecified: Secondary | ICD-10-CM | POA: Diagnosis not present

## 2018-08-01 DIAGNOSIS — Z86010 Personal history of colonic polyps: Secondary | ICD-10-CM | POA: Diagnosis not present

## 2018-08-01 DIAGNOSIS — I480 Paroxysmal atrial fibrillation: Secondary | ICD-10-CM | POA: Diagnosis not present

## 2018-08-01 DIAGNOSIS — H6121 Impacted cerumen, right ear: Secondary | ICD-10-CM | POA: Diagnosis not present

## 2018-08-01 DIAGNOSIS — Z8589 Personal history of malignant neoplasm of other organs and systems: Secondary | ICD-10-CM | POA: Diagnosis not present

## 2018-08-01 DIAGNOSIS — E1122 Type 2 diabetes mellitus with diabetic chronic kidney disease: Secondary | ICD-10-CM | POA: Diagnosis not present

## 2018-08-01 DIAGNOSIS — I10 Essential (primary) hypertension: Secondary | ICD-10-CM | POA: Diagnosis not present

## 2018-08-01 DIAGNOSIS — R8290 Unspecified abnormal findings in urine: Secondary | ICD-10-CM | POA: Diagnosis not present

## 2018-08-01 DIAGNOSIS — G4733 Obstructive sleep apnea (adult) (pediatric): Secondary | ICD-10-CM | POA: Diagnosis not present

## 2018-08-01 LAB — CUP PACEART REMOTE DEVICE CHECK
Date Time Interrogation Session: 20200203131051
Implantable Pulse Generator Implant Date: 20190924

## 2018-08-06 NOTE — Progress Notes (Signed)
Carelink Summary Report / Loop Recorder 

## 2018-08-13 ENCOUNTER — Ambulatory Visit (INDEPENDENT_AMBULATORY_CARE_PROVIDER_SITE_OTHER): Payer: Medicare Other | Admitting: Pharmacist

## 2018-08-13 DIAGNOSIS — I4891 Unspecified atrial fibrillation: Secondary | ICD-10-CM

## 2018-08-13 DIAGNOSIS — Z7901 Long term (current) use of anticoagulants: Secondary | ICD-10-CM

## 2018-08-13 DIAGNOSIS — I480 Paroxysmal atrial fibrillation: Secondary | ICD-10-CM | POA: Diagnosis not present

## 2018-08-13 DIAGNOSIS — Z5181 Encounter for therapeutic drug level monitoring: Secondary | ICD-10-CM

## 2018-08-13 LAB — POCT INR: INR: 2.7 (ref 2.0–3.0)

## 2018-08-13 NOTE — Patient Instructions (Signed)
Description   Take 1/2 tablet tomorrow, then continue taking 1 tablet daily except 1/2 tablet on Sundays and Thursdays.  Recheck INR in 4 weeks. Call if scheduled for any procedures, surgery or medication changes at [phone]

## 2018-08-15 ENCOUNTER — Ambulatory Visit
Admission: RE | Admit: 2018-08-15 | Discharge: 2018-08-15 | Disposition: A | Discharge status: Home / Self Care | Payer: Medicare Other | Source: Ambulatory Visit | Attending: Gastroenterology | Admitting: Gastroenterology

## 2018-08-15 DIAGNOSIS — K8020 Calculus of gallbladder without cholecystitis without obstruction: Secondary | ICD-10-CM | POA: Diagnosis not present

## 2018-08-15 DIAGNOSIS — Z86010 Personal history of colonic polyps: Secondary | ICD-10-CM

## 2018-08-16 ENCOUNTER — Other Ambulatory Visit: Payer: Self-pay | Admitting: Interventional Cardiology

## 2018-08-16 DIAGNOSIS — I480 Paroxysmal atrial fibrillation: Secondary | ICD-10-CM

## 2018-08-16 DIAGNOSIS — G4733 Obstructive sleep apnea (adult) (pediatric): Secondary | ICD-10-CM

## 2018-08-16 DIAGNOSIS — I10 Essential (primary) hypertension: Secondary | ICD-10-CM

## 2018-08-16 DIAGNOSIS — Z5181 Encounter for therapeutic drug level monitoring: Secondary | ICD-10-CM

## 2018-08-22 ENCOUNTER — Encounter (HOSPITAL_COMMUNITY): Payer: Self-pay | Admitting: Radiology

## 2018-08-22 ENCOUNTER — Emergency Department (HOSPITAL_COMMUNITY): Payer: No Typology Code available for payment source

## 2018-08-22 ENCOUNTER — Emergency Department (HOSPITAL_COMMUNITY)
Admission: EM | Admit: 2018-08-22 | Discharge: 2018-08-22 | Disposition: A | Discharge status: Home / Self Care | Payer: No Typology Code available for payment source | Attending: Emergency Medicine | Admitting: Emergency Medicine

## 2018-08-22 DIAGNOSIS — R072 Precordial pain: Secondary | ICD-10-CM | POA: Diagnosis not present

## 2018-08-22 DIAGNOSIS — I10 Essential (primary) hypertension: Secondary | ICD-10-CM | POA: Insufficient documentation

## 2018-08-22 DIAGNOSIS — Y92410 Unspecified street and highway as the place of occurrence of the external cause: Secondary | ICD-10-CM | POA: Insufficient documentation

## 2018-08-22 DIAGNOSIS — Y9389 Activity, other specified: Secondary | ICD-10-CM | POA: Diagnosis not present

## 2018-08-22 DIAGNOSIS — S2220XA Unspecified fracture of sternum, initial encounter for closed fracture: Secondary | ICD-10-CM | POA: Insufficient documentation

## 2018-08-22 DIAGNOSIS — E119 Type 2 diabetes mellitus without complications: Secondary | ICD-10-CM | POA: Diagnosis not present

## 2018-08-22 DIAGNOSIS — Y999 Unspecified external cause status: Secondary | ICD-10-CM | POA: Diagnosis not present

## 2018-08-22 DIAGNOSIS — S0990XA Unspecified injury of head, initial encounter: Secondary | ICD-10-CM | POA: Insufficient documentation

## 2018-08-22 DIAGNOSIS — S41112A Laceration without foreign body of left upper arm, initial encounter: Secondary | ICD-10-CM | POA: Insufficient documentation

## 2018-08-22 DIAGNOSIS — S299XXA Unspecified injury of thorax, initial encounter: Secondary | ICD-10-CM | POA: Diagnosis present

## 2018-08-22 DIAGNOSIS — Z23 Encounter for immunization: Secondary | ICD-10-CM | POA: Insufficient documentation

## 2018-08-22 DIAGNOSIS — Z85818 Personal history of malignant neoplasm of other sites of lip, oral cavity, and pharynx: Secondary | ICD-10-CM | POA: Diagnosis not present

## 2018-08-22 DIAGNOSIS — E039 Hypothyroidism, unspecified: Secondary | ICD-10-CM | POA: Insufficient documentation

## 2018-08-22 DIAGNOSIS — S199XXA Unspecified injury of neck, initial encounter: Secondary | ICD-10-CM | POA: Diagnosis not present

## 2018-08-22 LAB — I-STAT TROPONIN, ED: Troponin i, poc: 0 ng/mL (ref 0.00–0.08)

## 2018-08-22 LAB — BASIC METABOLIC PANEL
Anion gap: 10 (ref 5–15)
BUN: 19 mg/dL (ref 8–23)
CALCIUM: 9 mg/dL (ref 8.9–10.3)
CO2: 23 mmol/L (ref 22–32)
CREATININE: 1.64 mg/dL — AB (ref 0.61–1.24)
Chloride: 103 mmol/L (ref 98–111)
GFR calc Af Amer: 49 mL/min — ABNORMAL LOW (ref >60)
GFR calc non Af Amer: 43 mL/min — ABNORMAL LOW (ref >60)
Glucose, Bld: 114 mg/dL — ABNORMAL HIGH (ref 70–99)
Potassium: 4.1 mmol/L (ref 3.5–5.1)
SODIUM: 136 mmol/L (ref 135–145)

## 2018-08-22 LAB — CBC
HCT: 43.2 % (ref 39.0–52.0)
Hemoglobin: 13.7 g/dL (ref 13.0–17.0)
MCH: 30 pg (ref 26.0–34.0)
MCHC: 31.7 g/dL (ref 30.0–36.0)
MCV: 94.7 fL (ref 80.0–100.0)
Platelets: 165 10*3/uL (ref 150–400)
RBC: 4.56 MIL/uL (ref 4.22–5.81)
RDW: 13.3 % (ref 11.5–15.5)
WBC: 8.6 10*3/uL (ref 4.0–10.5)
nRBC: 0 % (ref 0.0–0.2)

## 2018-08-22 MED ORDER — LIDOCAINE 5 % EX PTCH: 1.0000 | MEDICATED_PATCH | CUTANEOUS | 0 refills | Status: DC

## 2018-08-22 MED ORDER — TETANUS-DIPHTH-ACELL PERTUSSIS 5-2.5-18.5 LF-MCG/0.5 IM SUSP
0.5000 mL | Freq: Once | INTRAMUSCULAR | Status: AC
  Administered 2018-08-22: 0.5 mL via INTRAMUSCULAR
  Filled 2018-08-22: qty 0.5

## 2018-08-22 MED ORDER — OXYCODONE HCL 5 MG PO TABS: 5.0000 mg | ORAL_TABLET | ORAL | 0 refills | Status: DC | PRN

## 2018-08-22 MED ORDER — SODIUM CHLORIDE 0.9% FLUSH: 3.0000 mL | Freq: Once | INTRAVENOUS | Status: DC

## 2018-08-22 MED ORDER — OXYCODONE HCL 5 MG PO TABS
10.0000 mg | ORAL_TABLET | Freq: Once | ORAL | Status: AC
  Administered 2018-08-22: 10 mg via ORAL
  Filled 2018-08-22: qty 2

## 2018-08-22 NOTE — ED Notes (Signed)
Pt returned from CT °
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
## 2018-08-26 ENCOUNTER — Telehealth: Payer: Self-pay | Admitting: Interventional Cardiology

## 2018-08-26 NOTE — Telephone Encounter (Signed)
New Message         Pt c/o BP issue: STAT if pt c/o blurred vision, one-sided weakness or slurred speech  1. What are your last 5 BP readings?  115/57 pul65  129/62 pulse 68 113/62 pul76  2. Are you having any other symptoms (ex. Dizziness, headache, blurred vision, passed out)? Very weak/Patient has had A-Fib  3. What is your BP issue? Low bp

## 2018-08-26 NOTE — Telephone Encounter (Signed)
Reviewed manual ILR transmission from 08/26/18. No symptom, tachy, pause, brady, or AF episodes noted. Presenting rhythm shows NSR at ~75bpm. Cardiac Compass shows recent increase in avg daytime HR and correlating decrease in daily patient activity and HR variability.  Tachy detection programmed at >162bpm for >=48 beats, pause detection programmed at >4.5sec, brady detection programmed at <30bpm for >=12 beats, AF detection programmed to store "all episodes."
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
## 2018-08-26 NOTE — Telephone Encounter (Signed)
See phone note from 08/26/18.

## 2018-08-28 ENCOUNTER — Telehealth: Payer: Self-pay | Admitting: Nurse Practitioner

## 2018-08-28 ENCOUNTER — Inpatient Hospital Stay (HOSPITAL_COMMUNITY)
Admission: EM | Admit: 2018-08-28 | Discharge: 2018-09-06 | DRG: 683 | Disposition: A | Discharge status: Home Health | Payer: No Typology Code available for payment source | Attending: Internal Medicine | Admitting: Internal Medicine

## 2018-08-28 ENCOUNTER — Encounter (HOSPITAL_COMMUNITY): Payer: Self-pay | Admitting: Pharmacy Technician

## 2018-08-28 ENCOUNTER — Emergency Department (HOSPITAL_COMMUNITY): Payer: No Typology Code available for payment source

## 2018-08-28 ENCOUNTER — Other Ambulatory Visit: Payer: Self-pay

## 2018-08-28 DIAGNOSIS — Z7901 Long term (current) use of anticoagulants: Secondary | ICD-10-CM

## 2018-08-28 DIAGNOSIS — I340 Nonrheumatic mitral (valve) insufficiency: Secondary | ICD-10-CM | POA: Diagnosis not present

## 2018-08-28 DIAGNOSIS — E1122 Type 2 diabetes mellitus with diabetic chronic kidney disease: Secondary | ICD-10-CM | POA: Diagnosis present

## 2018-08-28 DIAGNOSIS — E861 Hypovolemia: Secondary | ICD-10-CM | POA: Diagnosis present

## 2018-08-28 DIAGNOSIS — I10 Essential (primary) hypertension: Secondary | ICD-10-CM | POA: Diagnosis present

## 2018-08-28 DIAGNOSIS — R000 Tachycardia, unspecified: Secondary | ICD-10-CM | POA: Diagnosis not present

## 2018-08-28 DIAGNOSIS — I4891 Unspecified atrial fibrillation: Secondary | ICD-10-CM | POA: Diagnosis not present

## 2018-08-28 DIAGNOSIS — Z923 Personal history of irradiation: Secondary | ICD-10-CM

## 2018-08-28 DIAGNOSIS — L89023 Pressure ulcer of left elbow, stage 3: Secondary | ICD-10-CM | POA: Diagnosis not present

## 2018-08-28 DIAGNOSIS — L8990 Pressure ulcer of unspecified site, unspecified stage: Secondary | ICD-10-CM

## 2018-08-28 DIAGNOSIS — R0902 Hypoxemia: Secondary | ICD-10-CM | POA: Diagnosis not present

## 2018-08-28 DIAGNOSIS — Z8249 Family history of ischemic heart disease and other diseases of the circulatory system: Secondary | ICD-10-CM

## 2018-08-28 DIAGNOSIS — E039 Hypothyroidism, unspecified: Secondary | ICD-10-CM | POA: Diagnosis present

## 2018-08-28 DIAGNOSIS — I480 Paroxysmal atrial fibrillation: Secondary | ICD-10-CM

## 2018-08-28 DIAGNOSIS — Z79890 Hormone replacement therapy (postmenopausal): Secondary | ICD-10-CM

## 2018-08-28 DIAGNOSIS — I951 Orthostatic hypotension: Secondary | ICD-10-CM | POA: Diagnosis not present

## 2018-08-28 DIAGNOSIS — E860 Dehydration: Secondary | ICD-10-CM | POA: Diagnosis not present

## 2018-08-28 DIAGNOSIS — Z803 Family history of malignant neoplasm of breast: Secondary | ICD-10-CM

## 2018-08-28 DIAGNOSIS — N179 Acute kidney failure, unspecified: Secondary | ICD-10-CM | POA: Diagnosis not present

## 2018-08-28 DIAGNOSIS — Z9221 Personal history of antineoplastic chemotherapy: Secondary | ICD-10-CM | POA: Diagnosis not present

## 2018-08-28 DIAGNOSIS — I2510 Atherosclerotic heart disease of native coronary artery without angina pectoris: Secondary | ICD-10-CM | POA: Diagnosis present

## 2018-08-28 DIAGNOSIS — S2220XD Unspecified fracture of sternum, subsequent encounter for fracture with routine healing: Secondary | ICD-10-CM | POA: Diagnosis not present

## 2018-08-28 DIAGNOSIS — E785 Hyperlipidemia, unspecified: Secondary | ICD-10-CM | POA: Diagnosis present

## 2018-08-28 DIAGNOSIS — Z85818 Personal history of malignant neoplasm of other sites of lip, oral cavity, and pharynx: Secondary | ICD-10-CM | POA: Diagnosis not present

## 2018-08-28 DIAGNOSIS — G4733 Obstructive sleep apnea (adult) (pediatric): Secondary | ICD-10-CM | POA: Diagnosis present

## 2018-08-28 DIAGNOSIS — L89892 Pressure ulcer of other site, stage 2: Secondary | ICD-10-CM | POA: Diagnosis not present

## 2018-08-28 DIAGNOSIS — I77819 Aortic ectasia, unspecified site: Secondary | ICD-10-CM | POA: Diagnosis present

## 2018-08-28 DIAGNOSIS — S299XXA Unspecified injury of thorax, initial encounter: Secondary | ICD-10-CM | POA: Diagnosis not present

## 2018-08-28 DIAGNOSIS — E119 Type 2 diabetes mellitus without complications: Secondary | ICD-10-CM | POA: Diagnosis not present

## 2018-08-28 DIAGNOSIS — Z886 Allergy status to analgesic agent status: Secondary | ICD-10-CM | POA: Diagnosis not present

## 2018-08-28 DIAGNOSIS — K219 Gastro-esophageal reflux disease without esophagitis: Secondary | ICD-10-CM | POA: Diagnosis present

## 2018-08-28 DIAGNOSIS — N183 Chronic kidney disease, stage 3 (moderate): Secondary | ICD-10-CM | POA: Diagnosis present

## 2018-08-28 DIAGNOSIS — I4819 Other persistent atrial fibrillation: Secondary | ICD-10-CM | POA: Diagnosis present

## 2018-08-28 DIAGNOSIS — I361 Nonrheumatic tricuspid (valve) insufficiency: Secondary | ICD-10-CM | POA: Diagnosis not present

## 2018-08-28 DIAGNOSIS — Z7984 Long term (current) use of oral hypoglycemic drugs: Secondary | ICD-10-CM | POA: Diagnosis not present

## 2018-08-28 LAB — CBC WITH DIFFERENTIAL/PLATELET
Abs Immature Granulocytes: 0.03 10*3/uL (ref 0.00–0.07)
Basophils Absolute: 0 10*3/uL (ref 0.0–0.1)
Basophils Relative: 0 %
Eosinophils Absolute: 0.1 10*3/uL (ref 0.0–0.5)
Eosinophils Relative: 1 %
HCT: 41.6 % (ref 39.0–52.0)
Hemoglobin: 13.6 g/dL (ref 13.0–17.0)
Immature Granulocytes: 0 %
LYMPHS ABS: 0.4 10*3/uL — AB (ref 0.7–4.0)
Lymphocytes Relative: 5 %
MCH: 30.2 pg (ref 26.0–34.0)
MCHC: 32.7 g/dL (ref 30.0–36.0)
MCV: 92.2 fL (ref 80.0–100.0)
Monocytes Absolute: 0.9 10*3/uL (ref 0.1–1.0)
Monocytes Relative: 12 %
Neutro Abs: 6.5 10*3/uL (ref 1.7–7.7)
Neutrophils Relative %: 82 %
Platelets: 214 10*3/uL (ref 150–400)
RBC: 4.51 MIL/uL (ref 4.22–5.81)
RDW: 13.2 % (ref 11.5–15.5)
WBC: 8 10*3/uL (ref 4.0–10.5)
nRBC: 0 % (ref 0.0–0.2)

## 2018-08-28 LAB — COMPREHENSIVE METABOLIC PANEL
ALT: 26 U/L (ref 0–44)
AST: 26 U/L (ref 15–41)
Albumin: 3.3 g/dL — ABNORMAL LOW (ref 3.5–5.0)
Alkaline Phosphatase: 60 U/L (ref 38–126)
Anion gap: 14 (ref 5–15)
BUN: 48 mg/dL — ABNORMAL HIGH (ref 8–23)
CO2: 20 mmol/L — ABNORMAL LOW (ref 22–32)
Calcium: 8.9 mg/dL (ref 8.9–10.3)
Chloride: 100 mmol/L (ref 98–111)
Creatinine, Ser: 2.01 mg/dL — ABNORMAL HIGH (ref 0.61–1.24)
GFR calc Af Amer: 39 mL/min — ABNORMAL LOW (ref >60)
GFR calc non Af Amer: 33 mL/min — ABNORMAL LOW (ref >60)
Glucose, Bld: 149 mg/dL — ABNORMAL HIGH (ref 70–99)
Potassium: 4 mmol/L (ref 3.5–5.1)
Sodium: 134 mmol/L — ABNORMAL LOW (ref 135–145)
Total Bilirubin: 0.7 mg/dL (ref 0.3–1.2)
Total Protein: 6.6 g/dL (ref 6.5–8.1)

## 2018-08-28 LAB — BRAIN NATRIURETIC PEPTIDE: B Natriuretic Peptide: 105.8 pg/mL — ABNORMAL HIGH (ref 0.0–100.0)

## 2018-08-28 LAB — GLUCOSE, CAPILLARY: Glucose-Capillary: 157 mg/dL — ABNORMAL HIGH (ref 70–99)

## 2018-08-28 LAB — PROTIME-INR
INR: 6.1 (ref 0.8–1.2)
Prothrombin Time: 53.5 seconds — ABNORMAL HIGH (ref 11.4–15.2)

## 2018-08-28 LAB — TROPONIN I: Troponin I: <0.03 ng/mL (ref <0.03)

## 2018-08-28 MED ORDER — SODIUM CHLORIDE 0.9 % IV SOLN
INTRAVENOUS | Status: AC
  Administered 2018-08-28: 19:00:00 via INTRAVENOUS

## 2018-08-28 MED ORDER — SODIUM CHLORIDE 0.9 % IV BOLUS
500.0000 mL | Freq: Once | INTRAVENOUS | Status: AC
  Administered 2018-08-28: 500 mL via INTRAVENOUS

## 2018-08-28 MED ORDER — OXYCODONE HCL 5 MG PO TABS
5.0000 mg | ORAL_TABLET | ORAL | Status: DC | PRN
  Filled 2018-08-28: qty 1

## 2018-08-28 MED ORDER — LEVOTHYROXINE SODIUM 100 MCG PO TABS
100.0000 ug | ORAL_TABLET | Freq: Every day | ORAL | Status: DC
  Administered 2018-08-29 – 2018-09-06 (×9): 100 ug via ORAL
  Filled 2018-08-28 (×9): qty 1

## 2018-08-28 MED ORDER — AMIODARONE HCL 200 MG PO TABS
200.0000 mg | ORAL_TABLET | Freq: Every day | ORAL | Status: DC
  Administered 2018-08-28 – 2018-09-06 (×10): 200 mg via ORAL
  Filled 2018-08-28 (×10): qty 1

## 2018-08-28 MED ORDER — SODIUM CHLORIDE 0.9 % IV BOLUS
1000.0000 mL | Freq: Once | INTRAVENOUS | Status: AC
  Administered 2018-08-28: 1000 mL via INTRAVENOUS

## 2018-08-28 MED ORDER — INSULIN ASPART 100 UNIT/ML ~~LOC~~ SOLN
0.0000 [IU] | Freq: Three times a day (TID) | SUBCUTANEOUS | Status: DC
  Administered 2018-08-29 – 2018-08-30 (×4): 2 [IU] via SUBCUTANEOUS
  Administered 2018-08-30 – 2018-09-04 (×9): 1 [IU] via SUBCUTANEOUS
  Administered 2018-09-05: 2 [IU] via SUBCUTANEOUS

## 2018-08-28 MED ORDER — DOXAZOSIN MESYLATE 8 MG PO TABS
4.0000 mg | ORAL_TABLET | Freq: Every day | ORAL | Status: DC
  Administered 2018-08-28 – 2018-09-02 (×6): 4 mg via ORAL
  Filled 2018-08-28 (×7): qty 1

## 2018-08-28 MED ORDER — PRAVASTATIN SODIUM 40 MG PO TABS
80.0000 mg | ORAL_TABLET | Freq: Every day | ORAL | Status: DC
  Administered 2018-08-28 – 2018-09-05 (×9): 80 mg via ORAL
  Filled 2018-08-28 (×9): qty 2

## 2018-08-28 MED ORDER — DOCUSATE SODIUM 100 MG PO CAPS
100.0000 mg | ORAL_CAPSULE | Freq: Two times a day (BID) | ORAL | Status: DC | PRN
  Administered 2018-08-28: 100 mg via ORAL
  Filled 2018-08-28 (×2): qty 1

## 2018-08-28 MED ORDER — ONDANSETRON HCL 4 MG PO TABS: 4.0000 mg | ORAL_TABLET | Freq: Four times a day (QID) | ORAL | Status: DC | PRN

## 2018-08-28 MED ORDER — ACETAMINOPHEN 500 MG PO TABS
500.0000 mg | ORAL_TABLET | Freq: Two times a day (BID) | ORAL | Status: DC
  Administered 2018-08-28 – 2018-08-31 (×6): 500 mg via ORAL
  Filled 2018-08-28 (×7): qty 1

## 2018-08-28 MED ORDER — LIOTHYRONINE SODIUM 5 MCG PO TABS
5.0000 ug | ORAL_TABLET | Freq: Every day | ORAL | Status: DC
  Administered 2018-08-29 – 2018-09-06 (×9): 5 ug via ORAL
  Filled 2018-08-28 (×9): qty 1

## 2018-08-28 MED ORDER — METOPROLOL SUCCINATE ER 25 MG PO TB24
25.0000 mg | ORAL_TABLET | Freq: Every day | ORAL | Status: DC
  Administered 2018-08-28 – 2018-09-01 (×5): 25 mg via ORAL
  Filled 2018-08-28 (×5): qty 1

## 2018-08-28 MED ORDER — ONDANSETRON HCL 4 MG/2ML IJ SOLN
4.0000 mg | Freq: Four times a day (QID) | INTRAMUSCULAR | Status: DC | PRN
  Filled 2018-08-28: qty 2

## 2018-08-28 MED ORDER — HYDROCORTISONE 1 % EX CREA
TOPICAL_CREAM | Freq: Two times a day (BID) | CUTANEOUS | Status: DC
  Administered 2018-08-29: 22:00:00 via TOPICAL
  Administered 2018-08-30: 1 via TOPICAL
  Administered 2018-08-31 – 2018-09-05 (×3): via TOPICAL
  Filled 2018-08-28: qty 28
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
## 2018-08-28 NOTE — ED Notes (Signed)
Patient transported to X-ray 
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
## 2018-08-28 NOTE — ED Notes (Signed)
Critical Lab reported to this RN by Paulita Cradle in lab. INR- 6.1 & PT- 53.5
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
## 2018-08-29 DIAGNOSIS — G4733 Obstructive sleep apnea (adult) (pediatric): Secondary | ICD-10-CM

## 2018-08-29 DIAGNOSIS — N179 Acute kidney failure, unspecified: Secondary | ICD-10-CM

## 2018-08-29 LAB — CBC
HCT: 38.8 % — ABNORMAL LOW (ref 39.0–52.0)
Hemoglobin: 12.8 g/dL — ABNORMAL LOW (ref 13.0–17.0)
MCH: 29.8 pg (ref 26.0–34.0)
MCHC: 33 g/dL (ref 30.0–36.0)
MCV: 90.2 fL (ref 80.0–100.0)
Platelets: 230 10*3/uL (ref 150–400)
RBC: 4.3 MIL/uL (ref 4.22–5.81)
RDW: 13.2 % (ref 11.5–15.5)
WBC: 6.8 10*3/uL (ref 4.0–10.5)
nRBC: 0 % (ref 0.0–0.2)

## 2018-08-29 LAB — BASIC METABOLIC PANEL
Anion gap: 7 (ref 5–15)
BUN: 36 mg/dL — ABNORMAL HIGH (ref 8–23)
CALCIUM: 8.5 mg/dL — AB (ref 8.9–10.3)
CO2: 26 mmol/L (ref 22–32)
Chloride: 104 mmol/L (ref 98–111)
Creatinine, Ser: 1.61 mg/dL — ABNORMAL HIGH (ref 0.61–1.24)
GFR calc Af Amer: 51 mL/min — ABNORMAL LOW (ref >60)
GFR calc non Af Amer: 44 mL/min — ABNORMAL LOW (ref >60)
GLUCOSE: 114 mg/dL — AB (ref 70–99)
Potassium: 4.4 mmol/L (ref 3.5–5.1)
Sodium: 137 mmol/L (ref 135–145)

## 2018-08-29 LAB — GLUCOSE, CAPILLARY
Glucose-Capillary: 106 mg/dL — ABNORMAL HIGH (ref 70–99)
Glucose-Capillary: 155 mg/dL — ABNORMAL HIGH (ref 70–99)
Glucose-Capillary: 180 mg/dL — ABNORMAL HIGH (ref 70–99)
Glucose-Capillary: 110 mg/dL — ABNORMAL HIGH (ref 70–99)

## 2018-08-29 LAB — HIV ANTIBODY (ROUTINE TESTING W REFLEX): HIV Screen 4th Generation wRfx: NONREACTIVE

## 2018-08-29 LAB — PROTIME-INR
INR: 5.9 (ref 0.8–1.2)
Prothrombin Time: 52 seconds — ABNORMAL HIGH (ref 11.4–15.2)

## 2018-08-29 LAB — TSH: TSH: 4.329 u[IU]/mL (ref 0.350–4.500)

## 2018-08-29 MED ORDER — SODIUM CHLORIDE 0.9 % IV SOLN
INTRAVENOUS | Status: DC
  Administered 2018-08-29 – 2018-08-30 (×4): via INTRAVENOUS
  Administered 2018-08-31: 1 mL via INTRAVENOUS
  Administered 2018-08-31: 23:00:00 via INTRAVENOUS

## 2018-08-29 MED ORDER — TRAMADOL HCL 50 MG PO TABS: 100.0000 mg | ORAL_TABLET | Freq: Three times a day (TID) | ORAL | Status: DC

## 2018-08-29 MED ORDER — TRAMADOL 5 MG/ML ORAL SUSPENSION: 100.0000 mg | Freq: Three times a day (TID) | ORAL | Status: DC

## 2018-08-29 MED ORDER — TRAMADOL HCL 50 MG PO TABS
100.0000 mg | ORAL_TABLET | Freq: Three times a day (TID) | ORAL | Status: DC
  Administered 2018-08-29 – 2018-09-05 (×21): 100 mg via ORAL
  Filled 2018-08-29 (×23): qty 2

## 2018-08-29 MED ORDER — DOCUSATE SODIUM 50 MG/5ML PO LIQD
100.0000 mg | Freq: Two times a day (BID) | ORAL | Status: DC | PRN
  Administered 2018-08-29 – 2018-09-04 (×3): 100 mg via ORAL
  Filled 2018-08-29 (×4): qty 10

## 2018-08-29 NOTE — Progress Notes (Signed)
   Vital Signs MEWS/VS Documentation      08/29/2018 0928 08/29/2018 1401 08/29/2018 1402 08/29/2018 1404   MEWS Score:  1  0  0  3   MEWS Score Color:  Green  Green  Green  Yellow   Pulse:  -  84  73  64   BP:  -  (!) 106/52  112/73  (!) 69/44   Level of Consciousness:  Alert  -  -  -       Patient is Orthostatic    [name] L Brylei Pedley 08/29/2018,2:41 PM
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
## 2018-08-29 NOTE — Progress Notes (Signed)
Patient's BP was 66/44 upon standing for orthostatics.  He was lightheaded/dizzy.
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
## 2018-08-29 NOTE — Progress Notes (Signed)
Patient refusing CPAP for the night.  If patient changes mind instructed to call RT.

## 2018-08-29 NOTE — Plan of Care (Signed)
  Problem: Education: Goal: Knowledge of General Education information will improve Description: Including pain rating scale, medication(s)/side effects and non-pharmacologic comfort measures Outcome: Progressing   Problem: Clinical Measurements: Goal: Ability to maintain clinical measurements within normal limits will improve Outcome: Progressing   Problem: Clinical Measurements: Goal: Respiratory complications will improve Outcome: Progressing   Problem: Activity: Goal: Risk for activity intolerance will decrease Outcome: Progressing   Problem: Safety: Goal: Ability to remain free from injury will improve Outcome: Progressing   
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
## 2018-08-29 NOTE — Telephone Encounter (Signed)
Pt admitted to hospital  

## 2018-08-30 LAB — BASIC METABOLIC PANEL
Anion gap: 6 (ref 5–15)
BUN: 24 mg/dL — ABNORMAL HIGH (ref 8–23)
CO2: 21 mmol/L — ABNORMAL LOW (ref 22–32)
Calcium: 8.1 mg/dL — ABNORMAL LOW (ref 8.9–10.3)
Chloride: 106 mmol/L (ref 98–111)
Creatinine, Ser: 1.39 mg/dL — ABNORMAL HIGH (ref 0.61–1.24)
GFR calc Af Amer: >60 mL/min (ref >60)
GFR calc non Af Amer: 52 mL/min — ABNORMAL LOW (ref >60)
Glucose, Bld: 107 mg/dL — ABNORMAL HIGH (ref 70–99)
Potassium: 4.1 mmol/L (ref 3.5–5.1)
Sodium: 133 mmol/L — ABNORMAL LOW (ref 135–145)

## 2018-08-30 LAB — URINALYSIS, ROUTINE W REFLEX MICROSCOPIC
Bilirubin Urine: NEGATIVE
Glucose, UA: NEGATIVE mg/dL
Ketones, ur: NEGATIVE mg/dL
Leukocytes,Ua: NEGATIVE
Nitrite: NEGATIVE
PROTEIN: NEGATIVE mg/dL
Specific Gravity, Urine: 1.009 (ref 1.005–1.030)
pH: 6 (ref 5.0–8.0)

## 2018-08-30 LAB — GLUCOSE, CAPILLARY
GLUCOSE-CAPILLARY: 137 mg/dL — AB (ref 70–99)
Glucose-Capillary: 152 mg/dL — ABNORMAL HIGH (ref 70–99)
Glucose-Capillary: 155 mg/dL — ABNORMAL HIGH (ref 70–99)
Glucose-Capillary: 140 mg/dL — ABNORMAL HIGH (ref 70–99)

## 2018-08-30 LAB — PROTIME-INR
INR: 5.5 — AB (ref 0.8–1.2)
Prothrombin Time: 48.9 seconds — ABNORMAL HIGH (ref 11.4–15.2)

## 2018-08-30 LAB — CREATININE, URINE, RANDOM: Creatinine, Urine: 44.63 mg/dL

## 2018-08-30 LAB — MAGNESIUM: Magnesium: 1.6 mg/dL — ABNORMAL LOW (ref 1.7–2.4)

## 2018-08-30 LAB — SODIUM, URINE, RANDOM: Sodium, Ur: 103 mmol/L

## 2018-08-30 MED ORDER — MAGNESIUM SULFATE 4 GM/100ML IV SOLN
4.0000 g | Freq: Once | INTRAVENOUS | Status: AC
  Administered 2018-08-30: 4 g via INTRAVENOUS
  Filled 2018-08-30: qty 100
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
## 2018-08-30 NOTE — Care Management Note (Signed)
Case Management Note  Patient Details  Name: [name] MRN: [id number] Date of Birth: December 02, 1950  Subjective/Objective:  Orthostatic hypotension                Action/Plan: Patient lives at home; PCP: [name], MD; has private insurance with Medicare; CM will continue to follow for progression of care.  Expected Discharge Date:    possibly 08/31/2018              Expected Discharge Plan:  Home/Self Care  Discharge planning Services  CM Consult  Status of Service:  In process, will continue to follow  Sherrilyn Rist [phone] 08/30/2018, 9:41 AM

## 2018-08-30 NOTE — Plan of Care (Signed)
  Problem: Education: Goal: Knowledge of General Education information will improve Description: Including pain rating scale, medication(s)/side effects and non-pharmacologic comfort measures Outcome: Progressing   Problem: Health Behavior/Discharge Planning: Goal: Ability to manage health-related needs will improve Outcome: Progressing   Problem: Activity: Goal: Risk for activity intolerance will decrease Outcome: Progressing   Problem: Elimination: Goal: Will not experience complications related to bowel motility Outcome: Progressing   
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
## 2018-08-30 NOTE — Care Management Important Message (Signed)
Important Message  Patient Details  Name: [name] MRN: [id number] Date of Birth: [birth year]   Medicare Important Message Given:  Yes    Zehra Rucci P Kerrick 08/30/2018, 11:13 AM

## 2018-08-31 LAB — CBC
HCT: 36.4 % — ABNORMAL LOW (ref 39.0–52.0)
Hemoglobin: 11.8 g/dL — ABNORMAL LOW (ref 13.0–17.0)
MCH: 29.2 pg (ref 26.0–34.0)
MCHC: 32.4 g/dL (ref 30.0–36.0)
MCV: 90.1 fL (ref 80.0–100.0)
Platelets: 213 10*3/uL (ref 150–400)
RBC: 4.04 MIL/uL — ABNORMAL LOW (ref 4.22–5.81)
RDW: 12.9 % (ref 11.5–15.5)
WBC: 7 10*3/uL (ref 4.0–10.5)
nRBC: 0 % (ref 0.0–0.2)

## 2018-08-31 LAB — BASIC METABOLIC PANEL
Anion gap: 3 — ABNORMAL LOW (ref 5–15)
BUN: 15 mg/dL (ref 8–23)
CO2: 24 mmol/L (ref 22–32)
Calcium: 8 mg/dL — ABNORMAL LOW (ref 8.9–10.3)
Chloride: 106 mmol/L (ref 98–111)
Creatinine, Ser: 1.26 mg/dL — ABNORMAL HIGH (ref 0.61–1.24)
GFR calc Af Amer: >60 mL/min (ref >60)
GFR calc non Af Amer: 59 mL/min — ABNORMAL LOW (ref >60)
Glucose, Bld: 108 mg/dL — ABNORMAL HIGH (ref 70–99)
POTASSIUM: 4.4 mmol/L (ref 3.5–5.1)
Sodium: 133 mmol/L — ABNORMAL LOW (ref 135–145)

## 2018-08-31 LAB — PROTIME-INR
INR: 4.6 (ref 0.8–1.2)
Prothrombin Time: 42.4 seconds — ABNORMAL HIGH (ref 11.4–15.2)

## 2018-08-31 LAB — GLUCOSE, CAPILLARY
Glucose-Capillary: 105 mg/dL — ABNORMAL HIGH (ref 70–99)
Glucose-Capillary: 128 mg/dL — ABNORMAL HIGH (ref 70–99)
Glucose-Capillary: 138 mg/dL — ABNORMAL HIGH (ref 70–99)
Glucose-Capillary: 145 mg/dL — ABNORMAL HIGH (ref 70–99)

## 2018-08-31 LAB — URINE CULTURE: Culture: NO GROWTH

## 2018-08-31 LAB — MAGNESIUM: Magnesium: 1.8 mg/dL (ref 1.7–2.4)

## 2018-08-31 MED ORDER — ACETAMINOPHEN 160 MG/5ML PO SOLN
500.0000 mg | Freq: Two times a day (BID) | ORAL | Status: DC
  Administered 2018-08-31 – 2018-09-04 (×5): 500 mg via ORAL
  Filled 2018-08-31 (×12): qty 20.3

## 2018-08-31 MED ORDER — MAGNESIUM SULFATE 4 GM/100ML IV SOLN
4.0000 g | Freq: Once | INTRAVENOUS | Status: AC
  Administered 2018-08-31: 4 g via INTRAVENOUS
  Filled 2018-08-31: qty 100

## 2018-08-31 MED ORDER — ACETAMINOPHEN 500 MG PO TABS
500.0000 mg | ORAL_TABLET | Freq: Two times a day (BID) | ORAL | Status: DC
  Administered 2018-09-01 – 2018-09-06 (×7): 500 mg via ORAL
  Filled 2018-08-31 (×9): qty 1
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
## 2018-09-01 ENCOUNTER — Inpatient Hospital Stay (HOSPITAL_COMMUNITY): Payer: No Typology Code available for payment source

## 2018-09-01 DIAGNOSIS — I340 Nonrheumatic mitral (valve) insufficiency: Secondary | ICD-10-CM

## 2018-09-01 DIAGNOSIS — I361 Nonrheumatic tricuspid (valve) insufficiency: Secondary | ICD-10-CM

## 2018-09-01 LAB — BASIC METABOLIC PANEL WITH GFR
Anion gap: 7 (ref 5–15)
BUN: 9 mg/dL (ref 8–23)
CO2: 26 mmol/L (ref 22–32)
Calcium: 8.4 mg/dL — ABNORMAL LOW (ref 8.9–10.3)
Chloride: 102 mmol/L (ref 98–111)
Creatinine, Ser: 1.2 mg/dL (ref 0.61–1.24)
GFR calc Af Amer: >60 mL/min
GFR calc non Af Amer: >60 mL/min
Glucose, Bld: 112 mg/dL — ABNORMAL HIGH (ref 70–99)
Potassium: 4.8 mmol/L (ref 3.5–5.1)
Sodium: 135 mmol/L (ref 135–145)

## 2018-09-01 LAB — ECHOCARDIOGRAM COMPLETE
Height: 77 in
Weight: 3785.6 oz

## 2018-09-01 LAB — CBC
HCT: 38.5 % — ABNORMAL LOW (ref 39.0–52.0)
Hemoglobin: 12.4 g/dL — ABNORMAL LOW (ref 13.0–17.0)
MCH: 29 pg (ref 26.0–34.0)
MCHC: 32.2 g/dL (ref 30.0–36.0)
MCV: 90.2 fL (ref 80.0–100.0)
Platelets: 227 K/uL (ref 150–400)
RBC: 4.27 MIL/uL (ref 4.22–5.81)
RDW: 12.8 % (ref 11.5–15.5)
WBC: 6.8 K/uL (ref 4.0–10.5)
nRBC: 0 % (ref 0.0–0.2)

## 2018-09-01 LAB — GLUCOSE, CAPILLARY
Glucose-Capillary: 115 mg/dL — ABNORMAL HIGH (ref 70–99)
Glucose-Capillary: 144 mg/dL — ABNORMAL HIGH (ref 70–99)
Glucose-Capillary: 125 mg/dL — ABNORMAL HIGH (ref 70–99)
Glucose-Capillary: 109 mg/dL — ABNORMAL HIGH (ref 70–99)

## 2018-09-01 LAB — MAGNESIUM: Magnesium: 1.8 mg/dL (ref 1.7–2.4)

## 2018-09-01 LAB — PROTIME-INR
INR: 4.2 (ref 0.8–1.2)
Prothrombin Time: 40 s — ABNORMAL HIGH (ref 11.4–15.2)

## 2018-09-01 MED ORDER — MAGNESIUM SULFATE 4 GM/100ML IV SOLN
4.0000 g | Freq: Once | INTRAVENOUS | Status: AC
  Administered 2018-09-01: 4 g via INTRAVENOUS
  Filled 2018-09-01: qty 100

## 2018-09-01 MED ORDER — SODIUM CHLORIDE 0.9 % IV BOLUS
500.0000 mL | Freq: Once | INTRAVENOUS | Status: AC
  Administered 2018-09-01: 500 mL via INTRAVENOUS
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
## 2018-09-01 NOTE — Progress Notes (Signed)
  Echocardiogram 2D Echocardiogram has been performed.  Jennette Dubin 09/01/2018, 3:16 PM

## 2018-09-02 ENCOUNTER — Ambulatory Visit: Payer: Medicare Other | Admitting: *Deleted

## 2018-09-02 LAB — PROTIME-INR
INR: 4.6 (ref 0.8–1.2)
Prothrombin Time: 42.6 seconds — ABNORMAL HIGH (ref 11.4–15.2)

## 2018-09-02 LAB — BASIC METABOLIC PANEL
Anion gap: 6 (ref 5–15)
BUN: 7 mg/dL — ABNORMAL LOW (ref 8–23)
CO2: 24 mmol/L (ref 22–32)
Calcium: 7.8 mg/dL — ABNORMAL LOW (ref 8.9–10.3)
Chloride: 102 mmol/L (ref 98–111)
Creatinine, Ser: 1.13 mg/dL (ref 0.61–1.24)
GFR calc Af Amer: >60 mL/min (ref >60)
GFR calc non Af Amer: >60 mL/min (ref >60)
Glucose, Bld: 100 mg/dL — ABNORMAL HIGH (ref 70–99)
Potassium: 3.9 mmol/L (ref 3.5–5.1)
Sodium: 132 mmol/L — ABNORMAL LOW (ref 135–145)

## 2018-09-02 LAB — HEMOGLOBIN AND HEMATOCRIT, BLOOD
HCT: 35.7 % — ABNORMAL LOW (ref 39.0–52.0)
Hemoglobin: 11.7 g/dL — ABNORMAL LOW (ref 13.0–17.0)

## 2018-09-02 LAB — MAGNESIUM: Magnesium: 1.9 mg/dL (ref 1.7–2.4)

## 2018-09-02 LAB — GLUCOSE, CAPILLARY
GLUCOSE-CAPILLARY: 95 mg/dL (ref 70–99)
Glucose-Capillary: 134 mg/dL — ABNORMAL HIGH (ref 70–99)
Glucose-Capillary: 134 mg/dL — ABNORMAL HIGH (ref 70–99)
Glucose-Capillary: 95 mg/dL (ref 70–99)

## 2018-09-02 MED ORDER — METOPROLOL SUCCINATE ER 25 MG PO TB24: 12.5000 mg | ORAL_TABLET | Freq: Every day | ORAL | Status: DC

## 2018-09-02 NOTE — Progress Notes (Addendum)
   09/02/18 0821  Vitals  Temp (!) 97.5 F (36.4 C)  Temp Source Oral  BP 135/64  MAP (mmHg) 84  BP Method Automatic  Pulse Rate 89  Pulse Rate Source Monitor  Resp 18  Orthostatic Lying   BP- Lying 135/64  Pulse- Lying 89  Orthostatic Sitting  BP- Sitting 113/66  Pulse- Sitting 70  Orthostatic Standing at 0 minutes  BP- Standing at 0 minutes (!) 65/42  Pulse- Standing at 0 minutes 85  Orthostatic Standing at 3 minutes  BP- Standing at 3 minutes (!) 84/62  Pulse- Standing at 3 minutes 103  Oxygen Therapy  SpO2 97 %  O2 Device Room Air  MEWS Score  MEWS RR 0  MEWS Pulse 0  MEWS Systolic 0  MEWS LOC 0  MEWS Temp 0  MEWS Score 0  MEWS Score Color Green    Patient was symptomatic during orthostatic vitals. Patient had a difficult time standing when finishing up orthostatic vitals.  Pt reports feeling dizzy and nauseated after orthostatic vitals were taken. Pt is currently lying in bed with eyes close.

## 2018-09-02 NOTE — Progress Notes (Signed)
OT Cancellation Note  Patient Details Name: TYRICE HEWITT MRN: [id number] DOB: 02-05-1951   Cancelled Treatment:    Reason Eval/Treat Not Completed: Medical issues which prohibited therapy(orthostatic with symptoms). Recent orthostatic vitals (positive) with RN staff. Pt symptomatic, nauseated lying supine in bed with eyes closed. OT will continue to follow acutely for evaluation.   Merri Ray Barbarann Kelly 09/02/2018, 8:43 AM   Hulda Humphrey OTR/L Acute Rehabilitation Services Pager: [phone] Office: [phone]
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
## 2018-09-02 NOTE — Progress Notes (Signed)
Pt's INR 4.6, was 4.2, MD notified, will continue to monitor, Thanks Arvella Nigh RN.
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
## 2018-09-03 LAB — BASIC METABOLIC PANEL
Anion gap: 10 (ref 5–15)
BUN: 9 mg/dL (ref 8–23)
CO2: 23 mmol/L (ref 22–32)
Calcium: 8.6 mg/dL — ABNORMAL LOW (ref 8.9–10.3)
Chloride: 99 mmol/L (ref 98–111)
Creatinine, Ser: 1.23 mg/dL (ref 0.61–1.24)
GFR calc Af Amer: >60 mL/min (ref >60)
GFR calc non Af Amer: >60 mL/min (ref >60)
Glucose, Bld: 90 mg/dL (ref 70–99)
Potassium: 4.2 mmol/L (ref 3.5–5.1)
Sodium: 132 mmol/L — ABNORMAL LOW (ref 135–145)

## 2018-09-03 LAB — CUP PACEART REMOTE DEVICE CHECK
Date Time Interrogation Session: 20200307131041
MDC IDC PG IMPLANT DT: 20190924

## 2018-09-03 LAB — GLUCOSE, CAPILLARY
Glucose-Capillary: 123 mg/dL — ABNORMAL HIGH (ref 70–99)
Glucose-Capillary: 115 mg/dL — ABNORMAL HIGH (ref 70–99)
Glucose-Capillary: 168 mg/dL — ABNORMAL HIGH (ref 70–99)

## 2018-09-03 LAB — PROTIME-INR
INR: 3.1 — ABNORMAL HIGH (ref 0.8–1.2)
Prothrombin Time: 31.2 seconds — ABNORMAL HIGH (ref 11.4–15.2)

## 2018-09-03 LAB — MAGNESIUM: Magnesium: 1.6 mg/dL — ABNORMAL LOW (ref 1.7–2.4)

## 2018-09-03 MED ORDER — WARFARIN - PHARMACIST DOSING INPATIENT
Freq: Every day | Status: DC
  Administered 2018-09-05: 1

## 2018-09-03 MED ORDER — MAGNESIUM SULFATE 4 GM/100ML IV SOLN
4.0000 g | Freq: Once | INTRAVENOUS | Status: AC
  Administered 2018-09-03: 4 g via INTRAVENOUS
  Filled 2018-09-03: qty 100

## 2018-09-03 MED ORDER — WARFARIN SODIUM 2.5 MG PO TABS
2.5000 mg | ORAL_TABLET | Freq: Once | ORAL | Status: AC
  Administered 2018-09-03: 2.5 mg via ORAL
  Filled 2018-09-03: qty 1

## 2018-09-03 MED ORDER — SODIUM CHLORIDE 0.9 % IV SOLN
INTRAVENOUS | Status: DC | PRN
  Administered 2018-09-03: 250 mL via INTRAVENOUS

## 2018-09-03 NOTE — Care Management Important Message (Signed)
Important Message  Patient Details  Name: [name] MRN: [id number] Date of Birth: [birth year]   Medicare Important Message Given:  Yes    [name] 09/03/2018, 5:54 PM

## 2018-09-03 NOTE — Progress Notes (Signed)
Pt declined use of CPAP for the night.  Pt does not wear CPAP at home.
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
## 2018-09-03 NOTE — Progress Notes (Signed)
ANTICOAGULATION CONSULT NOTE - Initial Consult  Pharmacy Consult for Warfarin Dosing Indication: atrial fibrillation  Allergies  Allergen Reactions  . Nsaids Other (See Comments)    REACTION: PER MD REQUEST NOT TO TAKE    Patient Measurements: Height: 6\' 5"  (195.6 cm) Weight: 231 lb 14.4 oz (105.2 kg) IBW/kg (Calculated) : 89.1  Vital Signs: Temp: 98.9 F (37.2 C) (03/10 0558) Temp Source: Oral (03/10 0558) BP: 169/98 (03/10 0558) Pulse Rate: 62 (03/10 0558)  Labs: Recent Labs    09/01/18 0620 09/02/18 0502 09/03/18 0510  HGB 12.4* 11.7*  --   HCT 38.5* 35.7*  --   PLT 227  --   --   LABPROT 40.0* 42.6* 31.2*  INR 4.2* 4.6* 3.1*  CREATININE 1.20 1.13 1.23    Estimated Creatinine Clearance: 73.4 mL/min (by C-G formula based on SCr of 1.23 mg/dL).   Medical History: Past Medical History:  Diagnosis Date  . CAD (coronary artery disease)    70% LCx stenosis, treated medically  . Diabetes mellitus   . Encounter for therapeutic drug monitoring 07/22/2013  . Essential hypertension 03/10/2014  . GERD (gastroesophageal reflux disease)   . Hyperlipidemia 03/10/2014  . Hypertension   . Hypotension   . Hypothyroidism 03/10/2014  . Left atrial enlargement   . Obstructive sleep apnea 03/10/2014  . On Coumadin for atrial fibrillation ([place]) 04/02/2013  . Persistent atrial fibrillation   . 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 Valere Dross and [facility]   . Sleep apnea   . 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 Uncomplicated type 2 diabetes mellitus ([place]) 03/10/2014    Assessment: Warfarin has been held since admission due to high INR levels. INR has decreased to 3.1 so reduced dose of warfarin is warranted. PTA warfarin dosing 5 mg daily except 2.5 mg on Sun/Thurs.  Goal of Therapy:  INR 2-3 Monitor platelets by anticoagulation protocol: Yes   Plan:  Give warfarin 2.5 mg x 1 (reduced dose due  to still elevated INR) Monitor INR and CBC daily  Thanks, Lavena Bullion, PharmD Candidate 09/03/2018,8:41 AM
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
## 2018-09-03 NOTE — Progress Notes (Signed)
Noticed redness at IV insertion site.  Removed IV.   While removing IV found a pressure injury underneath the extension piece.   Assessed and placed foam over area.

## 2018-09-04 DIAGNOSIS — L8990 Pressure ulcer of unspecified site, unspecified stage: Secondary | ICD-10-CM

## 2018-09-04 DIAGNOSIS — L89023 Pressure ulcer of left elbow, stage 3: Secondary | ICD-10-CM

## 2018-09-04 LAB — BASIC METABOLIC PANEL
ANION GAP: 8 (ref 5–15)
BUN: 10 mg/dL (ref 8–23)
CO2: 26 mmol/L (ref 22–32)
Calcium: 8.5 mg/dL — ABNORMAL LOW (ref 8.9–10.3)
Chloride: 100 mmol/L (ref 98–111)
Creatinine, Ser: 1.28 mg/dL — ABNORMAL HIGH (ref 0.61–1.24)
GFR calc Af Amer: >60 mL/min (ref >60)
GFR calc non Af Amer: 58 mL/min — ABNORMAL LOW (ref >60)
Glucose, Bld: 105 mg/dL — ABNORMAL HIGH (ref 70–99)
Potassium: 3.8 mmol/L (ref 3.5–5.1)
Sodium: 134 mmol/L — ABNORMAL LOW (ref 135–145)

## 2018-09-04 LAB — CBC
HCT: 39.3 % (ref 39.0–52.0)
HEMOGLOBIN: 12.9 g/dL — AB (ref 13.0–17.0)
MCH: 29.3 pg (ref 26.0–34.0)
MCHC: 32.8 g/dL (ref 30.0–36.0)
MCV: 89.3 fL (ref 80.0–100.0)
Platelets: 237 10*3/uL (ref 150–400)
RBC: 4.4 MIL/uL (ref 4.22–5.81)
RDW: 12.8 % (ref 11.5–15.5)
WBC: 7.8 10*3/uL (ref 4.0–10.5)
nRBC: 0 % (ref 0.0–0.2)

## 2018-09-04 LAB — GLUCOSE, CAPILLARY
Glucose-Capillary: 93 mg/dL (ref 70–99)
Glucose-Capillary: 130 mg/dL — ABNORMAL HIGH (ref 70–99)
Glucose-Capillary: 146 mg/dL — ABNORMAL HIGH (ref 70–99)
Glucose-Capillary: 131 mg/dL — ABNORMAL HIGH (ref 70–99)

## 2018-09-04 LAB — PROTIME-INR
INR: 2.5 — ABNORMAL HIGH (ref 0.8–1.2)
Prothrombin Time: 26.9 seconds — ABNORMAL HIGH (ref 11.4–15.2)

## 2018-09-04 LAB — MAGNESIUM: Magnesium: 1.8 mg/dL (ref 1.7–2.4)

## 2018-09-04 MED ORDER — BISACODYL 10 MG RE SUPP
10.0000 mg | Freq: Every day | RECTAL | Status: DC | PRN
  Administered 2018-09-04: 10 mg via RECTAL
  Filled 2018-09-04: qty 1

## 2018-09-04 MED ORDER — WARFARIN SODIUM 5 MG PO TABS
5.0000 mg | ORAL_TABLET | Freq: Once | ORAL | Status: AC
  Administered 2018-09-04: 5 mg via ORAL
  Filled 2018-09-04: qty 1
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
## 2018-09-04 NOTE — Progress Notes (Addendum)
[name] for Warfarin Dosing Indication: atrial fibrillation  Allergies  Allergen Reactions  . Nsaids Other (See Comments)    REACTION: PER MD REQUEST NOT TO TAKE    Patient Measurements: Height: 6\' 5"  (195.6 cm) Weight: 232 lb 9.6 oz (105.5 kg) IBW/kg (Calculated) : 89.1  Vital Signs: Temp: 98 F (36.7 C) (03/11 0543) Temp Source: Oral (03/11 0543) BP: 168/98 (03/11 0543) Pulse Rate: 72 (03/11 0543)  Labs: Recent Labs    09/02/18 0502 09/03/18 0510 09/04/18 0507  HGB 11.7*  --  12.9*  HCT 35.7*  --  39.3  PLT  --   --  237  LABPROT 42.6* 31.2* 26.9*  INR 4.6* 3.1* 2.5*  CREATININE 1.13 1.23 1.28*    Estimated Creatinine Clearance: 70.6 mL/min (A) (by C-G formula based on SCr of 1.28 mg/dL (H)).   Medical History: Past Medical History:  Diagnosis Date  . CAD (coronary artery disease)    70% LCx stenosis, treated medically  . Diabetes mellitus   . Encounter for therapeutic drug monitoring 07/22/2013  . Essential hypertension 03/10/2014  . GERD (gastroesophageal reflux disease)   . Hyperlipidemia 03/10/2014  . Hypertension   . Hypotension   . Hypothyroidism 03/10/2014  . Left atrial enlargement   . Obstructive sleep apnea 03/10/2014  . On Coumadin for atrial fibrillation ([place]) 04/02/2013  . Persistent atrial fibrillation   . 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 Valere Dross and [facility]   . Sleep apnea   . 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 Uncomplicated type 2 diabetes mellitus ([place]) 03/10/2014    Assessment: INR has returned to therapeutic levels. PTA warfarin dosing 5 mg daily except 2.5 mg on Sun/Thurs.  Goal of Therapy:  INR 2-3 Monitor platelets by anticoagulation protocol: Yes   Plan:  Give warfarin 5 mg x 1 (PTA dose) Monitor INR and CBC daily  Thanks, Lavena Bullion, PharmD Candidate  I discussed / reviewed the pharmacy  note by Mr. Theda Sers and I agree with the resident's findings and plans as documented.  Thank you Anette Guarneri, PharmD [phone] 09/04/2018,9:38 AM

## 2018-09-05 ENCOUNTER — Ambulatory Visit (HOSPITAL_COMMUNITY): Payer: Medicare Other | Admitting: Physician Assistant

## 2018-09-05 ENCOUNTER — Encounter: Payer: Self-pay | Admitting: *Deleted

## 2018-09-05 LAB — BASIC METABOLIC PANEL
Anion gap: 9 (ref 5–15)
BUN: 9 mg/dL (ref 8–23)
CALCIUM: 8.7 mg/dL — AB (ref 8.9–10.3)
CO2: 27 mmol/L (ref 22–32)
Chloride: 97 mmol/L — ABNORMAL LOW (ref 98–111)
Creatinine, Ser: 1.27 mg/dL — ABNORMAL HIGH (ref 0.61–1.24)
GFR calc Af Amer: >60 mL/min (ref >60)
GFR calc non Af Amer: 58 mL/min — ABNORMAL LOW (ref >60)
Glucose, Bld: 111 mg/dL — ABNORMAL HIGH (ref 70–99)
Potassium: 4 mmol/L (ref 3.5–5.1)
SODIUM: 133 mmol/L — AB (ref 135–145)

## 2018-09-05 LAB — PROTIME-INR
INR: 2.9 — ABNORMAL HIGH (ref 0.8–1.2)
Prothrombin Time: 29.6 seconds — ABNORMAL HIGH (ref 11.4–15.2)

## 2018-09-05 LAB — GLUCOSE, CAPILLARY
GLUCOSE-CAPILLARY: 113 mg/dL — AB (ref 70–99)
Glucose-Capillary: 110 mg/dL — ABNORMAL HIGH (ref 70–99)
Glucose-Capillary: 157 mg/dL — ABNORMAL HIGH (ref 70–99)
Glucose-Capillary: 29 mg/dL — CL (ref 70–99)
Glucose-Capillary: 135 mg/dL — ABNORMAL HIGH (ref 70–99)

## 2018-09-05 MED ORDER — WARFARIN SODIUM 2.5 MG PO TABS
2.5000 mg | ORAL_TABLET | Freq: Once | ORAL | Status: AC
  Administered 2018-09-05: 2.5 mg via ORAL
  Filled 2018-09-05: qty 1

## 2018-09-05 MED ORDER — BISACODYL 5 MG PO TBEC
10.0000 mg | DELAYED_RELEASE_TABLET | Freq: Once | ORAL | Status: AC
  Administered 2018-09-05: 10 mg via ORAL
  Filled 2018-09-05: qty 2

## 2018-09-05 MED ORDER — QUINAPRIL HCL 40 MG PO TABS: 20.0000 mg | ORAL_TABLET | ORAL | 0 refills | Status: DC

## 2018-09-05 NOTE — Progress Notes (Signed)
Pt requesting suppository paged md not orders or call back received.
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
## 2018-09-05 NOTE — Progress Notes (Signed)
Patient hypoglycemic this evening.  Blood sugar corrected per standing orders successfully.  Patient eating dinner at this time.  Marcille Blanco, RN
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
## 2018-09-05 NOTE — Progress Notes (Signed)
[facility] for Warfarin Dosing Indication: atrial fibrillation  Allergies  Allergen Reactions  . Nsaids Other (See Comments)    REACTION: PER MD REQUEST NOT TO TAKE    Patient Measurements: Height: 6\' 5"  (195.6 cm) Weight: 229 lb 8 oz (104.1 kg)(scale a) IBW/kg (Calculated) : 89.1  Vital Signs: Temp: 97.8 F (36.6 C) (03/12 0457) Temp Source: Oral (03/12 0457) BP: 141/83 (03/12 0457) Pulse Rate: 65 (03/12 0457)  Labs: Recent Labs    09/03/18 0510 09/04/18 0507 09/05/18 0601  HGB  --  12.9*  --   HCT  --  39.3  --   PLT  --  237  --   LABPROT 31.2* 26.9* 29.6*  INR 3.1* 2.5* 2.9*  CREATININE 1.23 1.28* 1.27*    Estimated Creatinine Clearance: 71.1 mL/min (A) (by C-G formula based on SCr of 1.27 mg/dL (H)).   Medical History: Past Medical History:  Diagnosis Date  . CAD (coronary artery disease)    70% LCx stenosis, treated medically  . Diabetes mellitus   . Encounter for therapeutic drug monitoring 07/22/2013  . Essential hypertension 03/10/2014  . GERD (gastroesophageal reflux disease)   . Hyperlipidemia 03/10/2014  . Hypertension   . Hypotension   . Hypothyroidism 03/10/2014  . Left atrial enlargement   . Obstructive sleep apnea 03/10/2014  . On Coumadin for atrial fibrillation ([place]) 04/02/2013  . Persistent atrial fibrillation   . 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 Valere Dross and [facility]   . Sleep apnea   . 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 Uncomplicated type 2 diabetes mellitus ([place]) 03/10/2014   Assessment: 68 yo M on warfarin 5mg  daily exc 2.5mg  on Sun/Thurs PTA for Afib. INR down from admit. Therapeutic at 2.9, up from 2.5 yesterday. Hgb 12.9, plts wnl.  Goal of Therapy:  INR 2-3 Monitor platelets by anticoagulation protocol: Yes   Plan:  Give Coumadin 2.5mg  PO x 1 Monitor INR and CBC daily  Elenor Quinones, PharmD,  BCPS, [facility] Clinical Pharmacist Phone number [phone] 09/05/2018 11:06 AM

## 2018-09-05 NOTE — Progress Notes (Signed)
Abdominal binder did not fit pt. attempted to order larger size non in stock.

## 2018-09-05 NOTE — Progress Notes (Signed)
Patient complained of nausea and broke in cold sweat while getting ready to go home.  VS taken, BP high.  MD was paged and notified.  Per MD, cancel discharge and monitor patient overnight.  Patient's sister at bedside.  Will continue to monitor.  Marcille Blanco, RN

## 2018-09-06 LAB — PROTIME-INR
INR: 2.8 — ABNORMAL HIGH (ref 0.8–1.2)
Prothrombin Time: 28.8 seconds — ABNORMAL HIGH (ref 11.4–15.2)

## 2018-09-06 LAB — GLUCOSE, CAPILLARY
Glucose-Capillary: 91 mg/dL (ref 70–99)
Glucose-Capillary: 120 mg/dL — ABNORMAL HIGH (ref 70–99)

## 2018-09-06 MED ORDER — WARFARIN SODIUM 5 MG PO TABS: 5.0000 mg | ORAL_TABLET | Freq: Once | ORAL | Status: DC

## 2018-09-06 MED ORDER — FLEET ENEMA 7-19 GM/118ML RE ENEM
1.0000 | ENEMA | Freq: Once | RECTAL | Status: AC
  Administered 2018-09-06: 1 via RECTAL
  Filled 2018-09-06: qty 1

## 2018-09-06 NOTE — Care Management Important Message (Signed)
Important Message  Patient Details  Name: [name] MRN: [id number] Date of Birth: Dec 13, 1950   Medicare Important Message Given:  Yes    Cresencia Asmus P Reginald Weida 09/06/2018, 2:54 PM

## 2018-09-06 NOTE — Progress Notes (Signed)
[place] for Warfarin Dosing Indication: atrial fibrillation  Allergies  Allergen Reactions  . Nsaids Other (See Comments)    REACTION: PER MD REQUEST NOT TO TAKE    Patient Measurements: Height: 6\' 5"  (195.6 cm) Weight: 225 lb (102.1 kg)(scale a) IBW/kg (Calculated) : 89.1  Vital Signs: Temp: 98.2 F (36.8 C) (03/13 0449) Temp Source: Oral (03/13 0449) BP: 150/99 (03/13 0449) Pulse Rate: 82 (03/13 0449)  Labs: Recent Labs    09/04/18 0507 09/05/18 0601 09/06/18 0453  HGB 12.9*  --   --   HCT 39.3  --   --   PLT 237  --   --   LABPROT 26.9* 29.6* 28.8*  INR 2.5* 2.9* 2.8*  CREATININE 1.28* 1.27*  --     Estimated Creatinine Clearance: 71.1 mL/min (A) (by C-G formula based on SCr of 1.27 mg/dL (H)).   Medical History: Past Medical History:  Diagnosis Date  . CAD (coronary artery disease)    70% LCx stenosis, treated medically  . Diabetes mellitus   . Encounter for therapeutic drug monitoring 07/22/2013  . Essential hypertension 03/10/2014  . GERD (gastroesophageal reflux disease)   . Hyperlipidemia 03/10/2014  . Hypertension   . Hypotension   . Hypothyroidism 03/10/2014  . Left atrial enlargement   . Obstructive sleep apnea 03/10/2014  . On Coumadin for atrial fibrillation ([place]) 04/02/2013  . Persistent atrial fibrillation   . 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 Valere Dross and [facility]   . Sleep apnea   . 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 Uncomplicated type 2 diabetes mellitus ([place]) 03/10/2014   Assessment: 68 yo M on warfarin 5mg  daily exc 2.5mg  on Sun/Thurs PTA for Afib. INR down from admit. INR remains therapeutic at 2.8. Hgb 12.9, plts wnl.  Goal of Therapy:  INR 2-3 Monitor platelets by anticoagulation protocol: Yes   Plan:  Give Coumadin 5mg  PO x 1 Monitor INR and CBC daily  Elenor Quinones, PharmD, BCPS,  [facility] Clinical Pharmacist Phone number [phone] 09/06/2018 8:18 AM

## 2018-09-06 NOTE — Progress Notes (Signed)
Discharged cancelled yesterday due to hypoglycemia, patient with poor oral intake. This am patient is feeling better, no nausea or vomiting and tolerating po well. Positive flatus but no bowel movement yet. His physical examination has not changed. Awake and alert, lungs clear to auscultation, heart S1-S2 present, abdomen soft, with no significant lower extremity edema. His capillary glucose this am is 91.   Will plan to order a fleet enema, and discontinue insulin sliding scale.  Plan for discharge today.

## 2018-09-06 NOTE — Discharge Instructions (Signed)

## 2018-09-12 ENCOUNTER — Encounter: Payer: Self-pay | Admitting: *Deleted

## 2018-09-12 ENCOUNTER — Other Ambulatory Visit: Payer: Self-pay | Admitting: *Deleted

## 2018-09-12 ENCOUNTER — Ambulatory Visit (HOSPITAL_COMMUNITY): Payer: Medicare Other | Admitting: Physician Assistant
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
## 2018-09-13 ENCOUNTER — Ambulatory Visit: Payer: Medicare Other | Admitting: *Deleted

## 2018-09-13 DIAGNOSIS — I10 Essential (primary) hypertension: Secondary | ICD-10-CM | POA: Diagnosis not present

## 2018-09-13 DIAGNOSIS — I951 Orthostatic hypotension: Secondary | ICD-10-CM | POA: Diagnosis not present

## 2018-09-13 DIAGNOSIS — N179 Acute kidney failure, unspecified: Secondary | ICD-10-CM | POA: Diagnosis not present

## 2018-09-13 DIAGNOSIS — E118 Type 2 diabetes mellitus with unspecified complications: Secondary | ICD-10-CM | POA: Diagnosis not present

## 2018-09-13 DIAGNOSIS — I480 Paroxysmal atrial fibrillation: Secondary | ICD-10-CM | POA: Diagnosis not present

## 2018-09-17 ENCOUNTER — Ambulatory Visit: Payer: Medicare Other | Admitting: Podiatry

## 2018-09-19 ENCOUNTER — Other Ambulatory Visit: Payer: Self-pay

## 2018-09-19 ENCOUNTER — Ambulatory Visit (INDEPENDENT_AMBULATORY_CARE_PROVIDER_SITE_OTHER): Payer: Medicare Other | Admitting: Pharmacist

## 2018-09-19 DIAGNOSIS — I480 Paroxysmal atrial fibrillation: Secondary | ICD-10-CM

## 2018-09-19 DIAGNOSIS — Z5181 Encounter for therapeutic drug level monitoring: Secondary | ICD-10-CM

## 2018-09-19 LAB — PROTIME-INR
INR: 6.6 (ref 0.8–1.2)
Prothrombin Time: 62.7 s — ABNORMAL HIGH (ref 9.1–12.0)

## 2018-09-19 LAB — POCT INR: INR: 6.3 — AB (ref 2.0–3.0)

## 2018-09-20 ENCOUNTER — Telehealth: Payer: Self-pay | Admitting: Pharmacist

## 2018-09-20 NOTE — Telephone Encounter (Signed)
1. Do you currently have a fever? no (yes = cancel and refer to pcp for e-visit) 2. Have you recently travelled on a cruise, internationally, or to [place], [place], [place], [place], [place], or [place], [place] [employer]) ? no (yes = cancel, stay home, monitor symptoms, and contact pcp or initiate e-visit if symptoms develop) 3. Have you been in contact with someone that is currently pending confirmation of Covid19 testing or has been confirmed to have the [place] virus?  no (yes = cancel, stay home, away from tested individual, monitor symptoms, and contact pcp or initiate e-visit if symptoms develop) 4. Are you currently experiencing fatigue or cough? no (yes = pt should be prepared to have a mask placed at the time of their visit). Has chronic cough from radiation  Pt. Advised that we are restricting visitors at this time and anyone present in the vehicle should meet the above criteria as well. Advised that visit will be at curbside for finger stick ONLY and will receive call with instructions. Pt also advised to please bring own pen for signature of arrival document.

## 2018-09-20 NOTE — Telephone Encounter (Signed)
LMOM to screen for COVID

## 2018-09-23 ENCOUNTER — Ambulatory Visit (INDEPENDENT_AMBULATORY_CARE_PROVIDER_SITE_OTHER): Payer: Medicare Other | Admitting: Pharmacist

## 2018-09-23 DIAGNOSIS — I480 Paroxysmal atrial fibrillation: Secondary | ICD-10-CM

## 2018-09-23 DIAGNOSIS — Z5181 Encounter for therapeutic drug level monitoring: Secondary | ICD-10-CM

## 2018-09-23 LAB — POCT INR: INR: 3.2 — AB (ref 2.0–3.0)

## 2018-09-25 ENCOUNTER — Ambulatory Visit: Payer: Medicare Other | Admitting: Podiatry

## 2018-09-27 ENCOUNTER — Encounter (HOSPITAL_COMMUNITY): Payer: Self-pay | Admitting: *Deleted

## 2018-10-01 ENCOUNTER — Telehealth: Payer: Self-pay

## 2018-10-01 NOTE — Telephone Encounter (Signed)

## 2018-10-01 NOTE — Telephone Encounter (Signed)
lmom for prescreen/drive up 

## 2018-10-02 ENCOUNTER — Ambulatory Visit (INDEPENDENT_AMBULATORY_CARE_PROVIDER_SITE_OTHER): Payer: Medicare Other | Admitting: Pharmacist

## 2018-10-02 ENCOUNTER — Other Ambulatory Visit: Payer: Self-pay

## 2018-10-02 ENCOUNTER — Ambulatory Visit (HOSPITAL_COMMUNITY)
Admission: RE | Admit: 2018-10-02 | Discharge: 2018-10-02 | Disposition: A | Discharge status: Home / Self Care | Payer: Medicare Other | Source: Ambulatory Visit | Attending: Physician Assistant | Admitting: Physician Assistant

## 2018-10-02 DIAGNOSIS — I480 Paroxysmal atrial fibrillation: Secondary | ICD-10-CM

## 2018-10-02 DIAGNOSIS — Z5181 Encounter for therapeutic drug level monitoring: Secondary | ICD-10-CM

## 2018-10-02 LAB — POCT INR: INR: 3.6 — AB (ref 2.0–3.0)
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
## 2018-10-03 ENCOUNTER — Ambulatory Visit (INDEPENDENT_AMBULATORY_CARE_PROVIDER_SITE_OTHER): Payer: Medicare Other | Admitting: *Deleted

## 2018-10-03 DIAGNOSIS — I480 Paroxysmal atrial fibrillation: Secondary | ICD-10-CM

## 2018-10-03 LAB — CUP PACEART REMOTE DEVICE CHECK
Date Time Interrogation Session: 20200409134942
Implantable Pulse Generator Implant Date: 20190924

## 2018-10-04 ENCOUNTER — Other Ambulatory Visit: Payer: Self-pay

## 2018-10-08 ENCOUNTER — Other Ambulatory Visit: Payer: Self-pay | Admitting: Interventional Cardiology

## 2018-10-10 ENCOUNTER — Telehealth: Payer: Self-pay | Admitting: Interventional Cardiology

## 2018-10-10 NOTE — Telephone Encounter (Signed)
Not doing virtual visits past 3pm.  Can add to Mondays schedule at 1130A or can be added to Wednesday at 2:30 or 3, whichever is available.

## 2018-10-10 NOTE — Telephone Encounter (Signed)
Patient set up for MyChart? YES - CONSENT VIA MYCHART  Is patient using Smartphone/computer/tablet? SMARTPHONE  Did audio/video work? DOXIMITY  Does patient need telephone visit? NO  Best phone number to use? [phone]  Special Instructions? PATIENT WILL HAVE MEDICATION LIST, BP LIST AND WT , WHEN RN CALLS

## 2018-10-11 NOTE — Telephone Encounter (Signed)
Moved patient to Wednesday 10/16/18 @ 3pm

## 2018-10-14 ENCOUNTER — Telehealth: Payer: Self-pay

## 2018-10-14 NOTE — Progress Notes (Signed)
Carelink Summary Report / Loop Recorder 

## 2018-10-14 NOTE — Telephone Encounter (Signed)

## 2018-10-15 ENCOUNTER — Telehealth: Payer: Medicare Other | Admitting: Interventional Cardiology
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
## 2018-10-16 ENCOUNTER — Other Ambulatory Visit: Payer: Self-pay

## 2018-10-16 ENCOUNTER — Ambulatory Visit (INDEPENDENT_AMBULATORY_CARE_PROVIDER_SITE_OTHER): Payer: Medicare Other | Admitting: Pharmacist

## 2018-10-16 ENCOUNTER — Encounter: Payer: Self-pay | Admitting: Interventional Cardiology

## 2018-10-16 ENCOUNTER — Telehealth (INDEPENDENT_AMBULATORY_CARE_PROVIDER_SITE_OTHER): Payer: Medicare Other | Admitting: Interventional Cardiology

## 2018-10-16 VITALS — BP 153/83 | HR 61 | Wt 230.0 lb

## 2018-10-16 DIAGNOSIS — Z5181 Encounter for therapeutic drug level monitoring: Secondary | ICD-10-CM | POA: Diagnosis not present

## 2018-10-16 DIAGNOSIS — E7849 Other hyperlipidemia: Secondary | ICD-10-CM

## 2018-10-16 DIAGNOSIS — I10 Essential (primary) hypertension: Secondary | ICD-10-CM

## 2018-10-16 DIAGNOSIS — I480 Paroxysmal atrial fibrillation: Secondary | ICD-10-CM | POA: Diagnosis not present

## 2018-10-16 DIAGNOSIS — Z9229 Personal history of other drug therapy: Secondary | ICD-10-CM

## 2018-10-16 DIAGNOSIS — Z7901 Long term (current) use of anticoagulants: Secondary | ICD-10-CM

## 2018-10-16 DIAGNOSIS — I4891 Unspecified atrial fibrillation: Secondary | ICD-10-CM

## 2018-10-16 LAB — POCT INR: INR: 2.2 (ref 2.0–3.0)

## 2018-10-16 MED ORDER — QUINAPRIL HCL 20 MG PO TABS: 20.0000 mg | ORAL_TABLET | Freq: Every day | ORAL | 3 refills | Status: DC

## 2018-10-16 NOTE — Patient Instructions (Signed)
Medication Instructions:  1) D/C [name] 40mg  as needed 2) START [name] 20mg  once daily  If you need a refill on your cardiac medications before your next appointment, please call your pharmacy.   Lab work: None If you have labs (blood work) drawn today and your tests are completely normal, you will receive your results only by: Marland Kitchen MyChart Message (if you have MyChart) OR . A paper copy in the mail If you have any lab test that is abnormal or we need to change your treatment, we will call you to review the results.  Testing/Procedures: None  Follow-Up: At [facility], you and your health needs are our priority.  As part of our continuing mission to provide you with exceptional heart care, we have created designated Provider Care Teams.  These Care Teams include your primary Cardiologist (physician) and Advanced Practice Providers (APPs -  Physician Assistants and Nurse Practitioners) who all work together to provide you with the care you need, when you need it. You will need a follow up appointment in 2-3 months.  Please call our office 2 months in advance to schedule this appointment.  You may see Sinclair Grooms, MD or one of the following Advanced Practice Providers on your designated Care Team:   Truitt Merle, NP Cecilie Kicks, NP . Kathyrn Drown, NP  Any Other Special Instructions Will Be Listed Below (If Applicable).  Monitor your blood pressure over the next 7-10 days and report those back to the office.

## 2018-10-17 ENCOUNTER — Telehealth: Payer: Self-pay | Admitting: Interventional Cardiology

## 2018-10-17 DIAGNOSIS — M79645 Pain in left finger(s): Secondary | ICD-10-CM | POA: Diagnosis not present

## 2018-10-17 NOTE — Telephone Encounter (Signed)
Spoke with pt and made him aware he was to take [name] 20mg  QD.  Pt verbalized understanding.

## 2018-10-17 NOTE — Telephone Encounter (Signed)
New Message:     Pt wants to know what milligram of [name] is he supposed to be taking?i

## 2018-10-31 DIAGNOSIS — M79645 Pain in left finger(s): Secondary | ICD-10-CM | POA: Diagnosis not present

## 2018-11-05 ENCOUNTER — Other Ambulatory Visit: Payer: Self-pay

## 2018-11-05 ENCOUNTER — Ambulatory Visit (INDEPENDENT_AMBULATORY_CARE_PROVIDER_SITE_OTHER): Payer: Medicare Other | Admitting: *Deleted

## 2018-11-05 DIAGNOSIS — I480 Paroxysmal atrial fibrillation: Secondary | ICD-10-CM | POA: Diagnosis not present

## 2018-11-06 ENCOUNTER — Other Ambulatory Visit: Payer: Self-pay

## 2018-11-06 ENCOUNTER — Encounter: Payer: Self-pay | Admitting: Podiatry

## 2018-11-06 ENCOUNTER — Ambulatory Visit (INDEPENDENT_AMBULATORY_CARE_PROVIDER_SITE_OTHER): Payer: Medicare Other | Admitting: Podiatry

## 2018-11-06 VITALS — Temp 98.6°F

## 2018-11-06 DIAGNOSIS — M79674 Pain in right toe(s): Secondary | ICD-10-CM

## 2018-11-06 DIAGNOSIS — E1142 Type 2 diabetes mellitus with diabetic polyneuropathy: Secondary | ICD-10-CM

## 2018-11-06 DIAGNOSIS — B351 Tinea unguium: Secondary | ICD-10-CM | POA: Diagnosis not present

## 2018-11-06 DIAGNOSIS — D689 Coagulation defect, unspecified: Secondary | ICD-10-CM

## 2018-11-06 DIAGNOSIS — M79675 Pain in left toe(s): Secondary | ICD-10-CM

## 2018-11-06 LAB — CUP PACEART REMOTE DEVICE CHECK
Date Time Interrogation Session: 20200512143940
Implantable Pulse Generator Implant Date: 20190924

## 2018-11-06 NOTE — Progress Notes (Signed)
Complaint:  Visit Type: Patient returns to my office for continued preventative foot care services. Complaint: Patient states" my nails have grown long and thick and become painful to walk and wear shoes" Patient has been diagnosed with DM with no foot complications. The patient presents for preventative foot care services. No changes to ROS  Podiatric Exam: Vascular: dorsalis pedis and posterior tibial pulses are palpable bilateral. Capillary return is immediate. Temperature gradient is WNL. Skin turgor WNL  Sensorium: Normal Semmes Weinstein monofilament test. Normal tactile sensation bilaterally. Nail Exam: Pt has thick disfigured discolored nails with subungual debris noted bilateral entire nail hallux through fifth toenails Ulcer Exam: There is no evidence of ulcer or pre-ulcerative changes or infection. Orthopedic Exam: Muscle tone and strength are WNL. No limitations in general ROM. No crepitus or effusions noted. Foot type and digits show no abnormalities. Bony prominences are unremarkable.  Hallux malleus right hallux.  Hammer toes 2-5  B/L Skin: No Porokeratosis. No infection or ulcers  Diagnosis:  Onychomycosis, , Pain in right toe, pain in left toes  Treatment & Plan Procedures and Treatment: Consent by patient was obtained for treatment procedures.   Debridement of mycotic and hypertrophic toenails, 1 through 5 bilateral and clearing of subungual debris. No ulceration, no infection noted.  Return Visit-Office Procedure: Patient instructed to return to the office for a follow up visit 3 months for continued evaluation and treatment.    Gardiner Barefoot DPM

## 2018-11-08 ENCOUNTER — Telehealth: Payer: Self-pay | Admitting: Cardiovascular Disease

## 2018-11-08 DIAGNOSIS — E782 Mixed hyperlipidemia: Secondary | ICD-10-CM | POA: Diagnosis not present

## 2018-11-08 DIAGNOSIS — E1122 Type 2 diabetes mellitus with diabetic chronic kidney disease: Secondary | ICD-10-CM | POA: Diagnosis not present

## 2018-11-08 DIAGNOSIS — N189 Chronic kidney disease, unspecified: Secondary | ICD-10-CM | POA: Diagnosis not present

## 2018-11-08 DIAGNOSIS — I4891 Unspecified atrial fibrillation: Secondary | ICD-10-CM | POA: Diagnosis not present

## 2018-11-08 DIAGNOSIS — E7800 Pure hypercholesterolemia, unspecified: Secondary | ICD-10-CM | POA: Diagnosis not present

## 2018-11-08 DIAGNOSIS — I25119 Atherosclerotic heart disease of native coronary artery with unspecified angina pectoris: Secondary | ICD-10-CM | POA: Diagnosis not present

## 2018-11-08 DIAGNOSIS — I480 Paroxysmal atrial fibrillation: Secondary | ICD-10-CM | POA: Diagnosis not present

## 2018-11-08 DIAGNOSIS — E118 Type 2 diabetes mellitus with unspecified complications: Secondary | ICD-10-CM | POA: Diagnosis not present

## 2018-11-08 DIAGNOSIS — E039 Hypothyroidism, unspecified: Secondary | ICD-10-CM | POA: Diagnosis not present

## 2018-11-08 DIAGNOSIS — I10 Essential (primary) hypertension: Secondary | ICD-10-CM | POA: Diagnosis not present

## 2018-11-08 NOTE — Telephone Encounter (Signed)
I spoke to the patient who said that he did end up speaking to someone about filling a prescription.  It is all cleared now.  He was thankful for the call.

## 2018-11-08 NOTE — Telephone Encounter (Addendum)
We did not call the patient today as there is no documentation to reflect. The pt does have an appt on 11/13/2018 and we will be calling the patient closer to that date to prescreen for the appt but we did not call the patient today. Will forward back to [place], [place].

## 2018-11-08 NOTE — Telephone Encounter (Signed)
New message:      Patient calling stating some one called him. I did not see a note 

## 2018-11-08 NOTE — Telephone Encounter (Signed)
Pt has appt with [facility] 5/20. Not sure if [facility] called the pt about appt.

## 2018-11-11 ENCOUNTER — Other Ambulatory Visit: Payer: Self-pay | Admitting: *Deleted

## 2018-11-11 MED ORDER — QUINAPRIL HCL 20 MG PO TABS: ORAL_TABLET | ORAL | 3 refills | Status: DC

## 2018-11-11 MED ORDER — METOPROLOL SUCCINATE ER 25 MG PO TB24: 25.0000 mg | ORAL_TABLET | Freq: Every day | ORAL | 3 refills | Status: DC

## 2018-11-11 MED ORDER — HYDROCHLOROTHIAZIDE 25 MG PO TABS: 25.0000 mg | ORAL_TABLET | Freq: Every day | ORAL | 3 refills | Status: DC

## 2018-11-12 ENCOUNTER — Telehealth: Payer: Self-pay

## 2018-11-12 NOTE — Telephone Encounter (Signed)

## 2018-11-13 ENCOUNTER — Other Ambulatory Visit: Payer: Self-pay

## 2018-11-13 ENCOUNTER — Ambulatory Visit (INDEPENDENT_AMBULATORY_CARE_PROVIDER_SITE_OTHER): Payer: Medicare Other

## 2018-11-13 DIAGNOSIS — I480 Paroxysmal atrial fibrillation: Secondary | ICD-10-CM | POA: Diagnosis not present

## 2018-11-13 DIAGNOSIS — Z5181 Encounter for therapeutic drug level monitoring: Secondary | ICD-10-CM

## 2018-11-13 LAB — POCT INR: INR: 2.6 (ref 2.0–3.0)

## 2018-11-13 NOTE — Patient Instructions (Signed)
Description   Called spoke with pt, advised to continue on same dosage 1/2 tablet daily except 1 tablet on Mondays and Fridays. Recheck in 4 weeks.

## 2018-11-21 DIAGNOSIS — N183 Chronic kidney disease, stage 3 (moderate): Secondary | ICD-10-CM | POA: Diagnosis not present

## 2018-11-21 NOTE — Progress Notes (Signed)
Carelink Summary Report / Loop Recorder 

## 2018-11-26 DIAGNOSIS — E1122 Type 2 diabetes mellitus with diabetic chronic kidney disease: Secondary | ICD-10-CM | POA: Diagnosis not present

## 2018-11-26 DIAGNOSIS — Z6829 Body mass index (BMI) 29.0-29.9, adult: Secondary | ICD-10-CM | POA: Diagnosis not present

## 2018-11-26 DIAGNOSIS — I129 Hypertensive chronic kidney disease with stage 1 through stage 4 chronic kidney disease, or unspecified chronic kidney disease: Secondary | ICD-10-CM | POA: Diagnosis not present

## 2018-11-26 DIAGNOSIS — I480 Paroxysmal atrial fibrillation: Secondary | ICD-10-CM | POA: Diagnosis not present

## 2018-11-26 DIAGNOSIS — E875 Hyperkalemia: Secondary | ICD-10-CM | POA: Diagnosis not present

## 2018-11-26 DIAGNOSIS — N183 Chronic kidney disease, stage 3 (moderate): Secondary | ICD-10-CM | POA: Diagnosis not present

## 2018-11-27 ENCOUNTER — Telehealth: Payer: Self-pay

## 2018-11-27 NOTE — Telephone Encounter (Signed)
lmom to move appt and for prescreen  

## 2018-12-04 ENCOUNTER — Telehealth: Payer: Self-pay | Admitting: *Deleted

## 2018-12-04 ENCOUNTER — Telehealth: Payer: Self-pay

## 2018-12-04 NOTE — Telephone Encounter (Signed)
1. COVID-19 Pre-Screening Questions:  . In the past 7 to 10 days have you had a cough,  shortness of breath, headache, congestion, fever (100 or greater) body aches, chills, sore throat, or sudden loss of taste or sense of smell?  No . Have you been around anyone with known Covid 19. No . Have you been around anyone who is awaiting Covid 19 test results in the past 7 to 10 days? No . Have you been around anyone who has been exposed to Covid 19, or has mentioned symptoms of Covid 19 within the past 7 to 10 days? No    2. Pt advised of visitor restrictions (no visitors allowed except if needed to conduct the visit). Also advised to arrive at appointment time and wear a mask.  Pt aware appt inside the building, too.

## 2018-12-04 NOTE — Telephone Encounter (Signed)
lmom for prescreen  

## 2018-12-05 MED ORDER — AMLODIPINE BESYLATE 5 MG PO TABS: 5.0000 mg | ORAL_TABLET | Freq: Every day | ORAL | 3 refills | Status: DC

## 2018-12-09 ENCOUNTER — Encounter: Payer: Medicare Other | Admitting: *Deleted

## 2018-12-10 ENCOUNTER — Ambulatory Visit (INDEPENDENT_AMBULATORY_CARE_PROVIDER_SITE_OTHER): Payer: Medicare Other | Admitting: Pharmacist

## 2018-12-10 ENCOUNTER — Other Ambulatory Visit: Payer: Self-pay

## 2018-12-10 DIAGNOSIS — Z5181 Encounter for therapeutic drug level monitoring: Secondary | ICD-10-CM | POA: Diagnosis not present

## 2018-12-10 DIAGNOSIS — I480 Paroxysmal atrial fibrillation: Secondary | ICD-10-CM | POA: Diagnosis not present

## 2018-12-10 LAB — POCT INR: INR: 2.4 (ref 2.0–3.0)

## 2018-12-10 NOTE — Patient Instructions (Signed)
Description   Continue on same dosage 1/2 tablet daily except 1 tablet on Mondays and Fridays. Recheck in 8 weeks.

## 2018-12-16 DIAGNOSIS — E1122 Type 2 diabetes mellitus with diabetic chronic kidney disease: Secondary | ICD-10-CM | POA: Diagnosis not present

## 2018-12-16 DIAGNOSIS — N179 Acute kidney failure, unspecified: Secondary | ICD-10-CM | POA: Diagnosis not present

## 2018-12-16 DIAGNOSIS — I480 Paroxysmal atrial fibrillation: Secondary | ICD-10-CM | POA: Diagnosis not present

## 2018-12-16 DIAGNOSIS — Z8679 Personal history of other diseases of the circulatory system: Secondary | ICD-10-CM | POA: Diagnosis not present

## 2018-12-16 DIAGNOSIS — I10 Essential (primary) hypertension: Secondary | ICD-10-CM | POA: Diagnosis not present

## 2018-12-24 DIAGNOSIS — H35033 Hypertensive retinopathy, bilateral: Secondary | ICD-10-CM | POA: Diagnosis not present

## 2018-12-24 DIAGNOSIS — H2513 Age-related nuclear cataract, bilateral: Secondary | ICD-10-CM | POA: Diagnosis not present

## 2018-12-24 DIAGNOSIS — E113393 Type 2 diabetes mellitus with moderate nonproliferative diabetic retinopathy without macular edema, bilateral: Secondary | ICD-10-CM | POA: Diagnosis not present

## 2018-12-30 DIAGNOSIS — H2513 Age-related nuclear cataract, bilateral: Secondary | ICD-10-CM | POA: Diagnosis not present

## 2019-01-02 DIAGNOSIS — H2511 Age-related nuclear cataract, right eye: Secondary | ICD-10-CM | POA: Diagnosis not present

## 2019-01-03 DIAGNOSIS — H2512 Age-related nuclear cataract, left eye: Secondary | ICD-10-CM | POA: Diagnosis not present

## 2019-01-10 ENCOUNTER — Ambulatory Visit (INDEPENDENT_AMBULATORY_CARE_PROVIDER_SITE_OTHER): Payer: Medicare Other | Admitting: *Deleted

## 2019-01-10 DIAGNOSIS — I480 Paroxysmal atrial fibrillation: Secondary | ICD-10-CM | POA: Diagnosis not present

## 2019-01-10 LAB — CUP PACEART REMOTE DEVICE CHECK
Date Time Interrogation Session: 20200717161031
Implantable Pulse Generator Implant Date: 20190924

## 2019-01-13 ENCOUNTER — Telehealth: Payer: Self-pay | Admitting: Nurse Practitioner
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
## 2019-01-13 NOTE — Telephone Encounter (Signed)

## 2019-01-14 ENCOUNTER — Encounter: Payer: Self-pay | Admitting: Nurse Practitioner

## 2019-01-14 ENCOUNTER — Ambulatory Visit (INDEPENDENT_AMBULATORY_CARE_PROVIDER_SITE_OTHER): Payer: Medicare Other | Admitting: Nurse Practitioner

## 2019-01-14 ENCOUNTER — Other Ambulatory Visit: Payer: Self-pay

## 2019-01-14 VITALS — BP 100/60 | HR 45 | Ht 76.0 in | Wt 242.8 lb

## 2019-01-14 DIAGNOSIS — I10 Essential (primary) hypertension: Secondary | ICD-10-CM | POA: Diagnosis not present

## 2019-01-14 DIAGNOSIS — I4891 Unspecified atrial fibrillation: Secondary | ICD-10-CM

## 2019-01-14 DIAGNOSIS — Z7901 Long term (current) use of anticoagulants: Secondary | ICD-10-CM | POA: Diagnosis not present

## 2019-01-14 DIAGNOSIS — Z9229 Personal history of other drug therapy: Secondary | ICD-10-CM

## 2019-01-14 DIAGNOSIS — I480 Paroxysmal atrial fibrillation: Secondary | ICD-10-CM | POA: Diagnosis not present

## 2019-01-14 DIAGNOSIS — Z5181 Encounter for therapeutic drug level monitoring: Secondary | ICD-10-CM | POA: Diagnosis not present

## 2019-01-14 LAB — HEPATIC FUNCTION PANEL
ALT: 17 IU/L (ref 0–44)
AST: 18 IU/L (ref 0–40)
Albumin: 4.5 g/dL (ref 3.8–4.8)
Alkaline Phosphatase: 54 IU/L (ref 39–117)
Bilirubin Total: 0.4 mg/dL (ref 0.0–1.2)
Bilirubin, Direct: 0.12 mg/dL (ref 0.00–0.40)
Total Protein: 6.6 g/dL (ref 6.0–8.5)

## 2019-01-14 LAB — CBC
Hematocrit: 41.7 % (ref 37.5–51.0)
Hemoglobin: 13.9 g/dL (ref 13.0–17.7)
MCH: 29.4 pg (ref 26.6–33.0)
MCHC: 33.3 g/dL (ref 31.5–35.7)
MCV: 88 fL (ref 79–97)
Platelets: 162 10*3/uL (ref 150–450)
RBC: 4.72 x10E6/uL (ref 4.14–5.80)
RDW: 13.6 % (ref 11.6–15.4)
WBC: 5.9 10*3/uL (ref 3.4–10.8)

## 2019-01-14 LAB — BASIC METABOLIC PANEL
BUN/Creatinine Ratio: 10 (ref 10–24)
BUN: 19 mg/dL (ref 8–27)
CO2: 22 mmol/L (ref 20–29)
Calcium: 9.1 mg/dL (ref 8.6–10.2)
Chloride: 101 mmol/L (ref 96–106)
Creatinine, Ser: 1.84 mg/dL — ABNORMAL HIGH (ref 0.76–1.27)
GFR calc Af Amer: 43 mL/min/{1.73_m2} — ABNORMAL LOW (ref >59)
GFR calc non Af Amer: 37 mL/min/{1.73_m2} — ABNORMAL LOW (ref >59)
Glucose: 125 mg/dL — ABNORMAL HIGH (ref 65–99)
Potassium: 4.2 mmol/L (ref 3.5–5.2)
Sodium: 138 mmol/L (ref 134–144)

## 2019-01-14 LAB — TSH: TSH: 6.39 u[IU]/mL — ABNORMAL HIGH (ref 0.450–4.500)

## 2019-01-14 NOTE — Patient Instructions (Addendum)
After Visit Summary:  We will be checking the following labs today - BMET, CBC, HPF, and TSH   Medication Instructions:    Continue with your current medicines. BUT  I am STOPPING Metoprolol - this should allow your heart rate to come up   If you need a refill on your cardiac medications before your next appointment, please call your pharmacy.     Testing/Procedures To Be Arranged:  N/A  Follow-Up:   See me in one month with repeat EKG    At [facility], you and your health needs are our priority.  As part of our continuing mission to provide you with exceptional heart care, we have created designated Provider Care Teams.  These Care Teams include your primary Cardiologist (physician) and Advanced Practice Providers (APPs -  Physician Assistants and Nurse Practitioners) who all work together to provide you with the care you need, when you need it.  Special Instructions:  . Stay safe, stay home, wash your hands for at least 20 seconds and wear a mask when out in public.  . It was good to talk with you today.    Call the [place] office at [phone] if you have any questions, problems or concerns.

## 2019-01-15 ENCOUNTER — Other Ambulatory Visit: Payer: Self-pay | Admitting: *Deleted

## 2019-01-15 MED ORDER — QUINAPRIL HCL 20 MG PO TABS: 20.0000 mg | ORAL_TABLET | Freq: Two times a day (BID) | ORAL | 3 refills | Status: DC

## 2019-01-15 NOTE — Progress Notes (Signed)
Carelink Summary Report / Loop Recorder 

## 2019-01-20 ENCOUNTER — Telehealth: Payer: Self-pay | Admitting: *Deleted

## 2019-01-20 NOTE — Telephone Encounter (Signed)
Noted  

## 2019-01-20 NOTE — Telephone Encounter (Signed)
Patient called in to report that over the weekend he felt "off" and his BP cuff showed an irregular HR alert. Some dizzy spells, as well as ongoing weakness and fatigue. Most recent BP 134/67, HR 67. Pt notes HRs and BP have improved since quinapril dose decreased and metoprolol stopped, but he reports he feels worse overall since his 01/14/19 visit with Tommas Olp, NP. Pt confirms compliance with all cardiac medications.  Reviewed 01/20/19 automatic LINQ transmission--no AF episodes detected since 10/28/18. Presenting rhythm as of 04:50 shows sinus brady @ 50bpm. No pause (>4.5sec) or brady (<30bpm, >=12 beats) episodes noted. Reassured pt that he has not had any AF episodes detected since May. He does not currently have a symptom activator. Advised I will route this message to Dr. [name] and L. Servando Snare, NP for review and recommendations. Encouraged pt to call back in the interim with any new or worsening symptoms. Pt verbalizes understanding and agreement with plan.

## 2019-01-20 NOTE — Telephone Encounter (Signed)
S/w pt due to pt calling in.  Pt's biggest complaint is wooziness pt goes to the grocery store and has to leave due to feeling woozy.   Pt is staying hydrated but has been taking BP every two hours. Advised pt to take bp twice daily at different times. Advised pt if symptoms persist to call office. Will send to [place] to [place].

## 2019-01-20 NOTE — Telephone Encounter (Signed)
Noted findings from Device clinic - no AF detected. BP and HR currently acceptable.   Needs to make sure he is staying hydrated with this extreme heat.   Tentatively see back as planned but let us know if symptoms persist.   Burtis Junes, RN, [facility] [address] [place], [place]  [address] [phone]

## 2019-01-23 DIAGNOSIS — H2512 Age-related nuclear cataract, left eye: Secondary | ICD-10-CM | POA: Diagnosis not present

## 2019-02-04 ENCOUNTER — Ambulatory Visit (INDEPENDENT_AMBULATORY_CARE_PROVIDER_SITE_OTHER): Payer: Medicare Other | Admitting: Pharmacist

## 2019-02-04 ENCOUNTER — Other Ambulatory Visit: Payer: Self-pay

## 2019-02-04 DIAGNOSIS — Z5181 Encounter for therapeutic drug level monitoring: Secondary | ICD-10-CM | POA: Diagnosis not present

## 2019-02-04 DIAGNOSIS — I480 Paroxysmal atrial fibrillation: Secondary | ICD-10-CM | POA: Diagnosis not present

## 2019-02-04 LAB — POCT INR: INR: 2.6 (ref 2.0–3.0)

## 2019-02-04 NOTE — Patient Instructions (Signed)
Continue on same dosage 1/2 tablet daily except 1 tablet on Mondays and Fridays. Recheck in 8 weeks.

## 2019-02-11 DIAGNOSIS — I10 Essential (primary) hypertension: Secondary | ICD-10-CM | POA: Diagnosis not present

## 2019-02-11 DIAGNOSIS — E039 Hypothyroidism, unspecified: Secondary | ICD-10-CM | POA: Diagnosis not present

## 2019-02-11 DIAGNOSIS — I25119 Atherosclerotic heart disease of native coronary artery with unspecified angina pectoris: Secondary | ICD-10-CM | POA: Diagnosis not present

## 2019-02-11 DIAGNOSIS — I4891 Unspecified atrial fibrillation: Secondary | ICD-10-CM | POA: Diagnosis not present

## 2019-02-11 DIAGNOSIS — N189 Chronic kidney disease, unspecified: Secondary | ICD-10-CM | POA: Diagnosis not present

## 2019-02-11 DIAGNOSIS — E782 Mixed hyperlipidemia: Secondary | ICD-10-CM | POA: Diagnosis not present

## 2019-02-11 DIAGNOSIS — E118 Type 2 diabetes mellitus with unspecified complications: Secondary | ICD-10-CM | POA: Diagnosis not present

## 2019-02-11 DIAGNOSIS — I480 Paroxysmal atrial fibrillation: Secondary | ICD-10-CM | POA: Diagnosis not present

## 2019-02-11 DIAGNOSIS — E1122 Type 2 diabetes mellitus with diabetic chronic kidney disease: Secondary | ICD-10-CM | POA: Diagnosis not present

## 2019-02-12 ENCOUNTER — Ambulatory Visit (INDEPENDENT_AMBULATORY_CARE_PROVIDER_SITE_OTHER): Payer: Medicare Other | Admitting: Podiatry

## 2019-02-12 ENCOUNTER — Other Ambulatory Visit: Payer: Self-pay

## 2019-02-12 ENCOUNTER — Encounter: Payer: Self-pay | Admitting: Podiatry

## 2019-02-12 VITALS — Temp 97.3°F

## 2019-02-12 DIAGNOSIS — M79675 Pain in left toe(s): Secondary | ICD-10-CM | POA: Diagnosis not present

## 2019-02-12 DIAGNOSIS — M79674 Pain in right toe(s): Secondary | ICD-10-CM

## 2019-02-12 DIAGNOSIS — D689 Coagulation defect, unspecified: Secondary | ICD-10-CM

## 2019-02-12 DIAGNOSIS — E1142 Type 2 diabetes mellitus with diabetic polyneuropathy: Secondary | ICD-10-CM

## 2019-02-12 DIAGNOSIS — B351 Tinea unguium: Secondary | ICD-10-CM

## 2019-02-12 NOTE — Progress Notes (Signed)
Complaint:  Visit Type: Patient returns to my office for continued preventative foot care services. Complaint: Patient states" my nails have grown long and thick and become painful to walk and wear shoes" Patient has been diagnosed with DM with no foot complications. The patient presents for preventative foot care services. No changes to ROS  Podiatric Exam: Vascular: dorsalis pedis and posterior tibial pulses are palpable bilateral. Capillary return is immediate. Temperature gradient is WNL. Skin turgor WNL  Sensorium: Normal Semmes Weinstein monofilament test. Normal tactile sensation bilaterally. Nail Exam: Pt has thick disfigured discolored nails with subungual debris noted bilateral entire nail hallux through fifth toenails Ulcer Exam: There is no evidence of ulcer or pre-ulcerative changes or infection. Orthopedic Exam: Muscle tone and strength are WNL. No limitations in general ROM. No crepitus or effusions noted. Foot type and digits show no abnormalities. Bony prominences are unremarkable.  Hallux malleus right hallux.  Hammer toes 2-5  B/L Skin: No Porokeratosis. No infection or ulcers  Diagnosis:  Onychomycosis, , Pain in right toe, pain in left toes  Treatment & Plan Procedures and Treatment: Consent by patient was obtained for treatment procedures.   Debridement of mycotic and hypertrophic toenails, 1 through 5 bilateral and clearing of subungual debris. No ulceration, no infection noted.  Return Visit-Office Procedure: Patient instructed to return to the office for a follow up visit 3 months for continued evaluation and treatment.    Gardiner Barefoot DPM

## 2019-02-13 ENCOUNTER — Ambulatory Visit (INDEPENDENT_AMBULATORY_CARE_PROVIDER_SITE_OTHER): Payer: Medicare Other | Admitting: *Deleted

## 2019-02-13 DIAGNOSIS — I480 Paroxysmal atrial fibrillation: Secondary | ICD-10-CM

## 2019-02-13 LAB — CUP PACEART REMOTE DEVICE CHECK
Date Time Interrogation Session: 20200819161053
Implantable Pulse Generator Implant Date: 20190924

## 2019-02-14 DIAGNOSIS — Z23 Encounter for immunization: Secondary | ICD-10-CM | POA: Diagnosis not present
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
## 2019-02-19 ENCOUNTER — Other Ambulatory Visit: Payer: Self-pay

## 2019-02-19 ENCOUNTER — Encounter: Payer: Self-pay | Admitting: Nurse Practitioner

## 2019-02-19 ENCOUNTER — Ambulatory Visit (INDEPENDENT_AMBULATORY_CARE_PROVIDER_SITE_OTHER): Payer: Medicare Other | Admitting: Nurse Practitioner

## 2019-02-19 VITALS — BP 130/72 | HR 59 | Ht 76.0 in | Wt 243.8 lb

## 2019-02-19 DIAGNOSIS — Z79899 Other long term (current) drug therapy: Secondary | ICD-10-CM

## 2019-02-19 DIAGNOSIS — Z9229 Personal history of other drug therapy: Secondary | ICD-10-CM

## 2019-02-19 DIAGNOSIS — I259 Chronic ischemic heart disease, unspecified: Secondary | ICD-10-CM | POA: Diagnosis not present

## 2019-02-19 DIAGNOSIS — I480 Paroxysmal atrial fibrillation: Secondary | ICD-10-CM | POA: Diagnosis not present

## 2019-02-19 DIAGNOSIS — Z7189 Other specified counseling: Secondary | ICD-10-CM

## 2019-02-19 LAB — BASIC METABOLIC PANEL
BUN/Creatinine Ratio: 13 (ref 10–24)
BUN: 22 mg/dL (ref 8–27)
CO2: 21 mmol/L (ref 20–29)
Calcium: 9.3 mg/dL (ref 8.6–10.2)
Chloride: 100 mmol/L (ref 96–106)
Creatinine, Ser: 1.75 mg/dL — ABNORMAL HIGH (ref 0.76–1.27)
GFR calc Af Amer: 46 mL/min/{1.73_m2} — ABNORMAL LOW (ref >59)
GFR calc non Af Amer: 39 mL/min/{1.73_m2} — ABNORMAL LOW (ref >59)
Glucose: 113 mg/dL — ABNORMAL HIGH (ref 65–99)
Potassium: 4.4 mmol/L (ref 3.5–5.2)
Sodium: 137 mmol/L (ref 134–144)

## 2019-02-19 LAB — TSH: TSH: 4.34 u[IU]/mL (ref 0.450–4.500)

## 2019-02-19 NOTE — Patient Instructions (Addendum)
After Visit Summary:  We will be checking the following labs today - BMET & TSH   Medication Instructions:    Continue with your current medicines.    If you need a refill on your cardiac medications before your next appointment, please call your pharmacy.     Testing/Procedures To Be Arranged:  N/A  Follow-Up:   See Dr. [name] in 3 months.     At [facility], you and your health needs are our priority.  As part of our continuing mission to provide you with exceptional heart care, we have created designated Provider Care Teams.  These Care Teams include your primary Cardiologist (physician) and Advanced Practice Providers (APPs -  Physician Assistants and Nurse Practitioners) who all work together to provide you with the care you need, when you need it.  Special Instructions:  . Stay safe, stay home, wash your hands for at least 20 seconds and wear a mask when out in public.  . It was good to talk with you today.  Marland Kitchen Keep working on trying to be more active - see if you can get back to walking at the mall    Call the [place] office at [phone] if you have any questions, problems or concerns.

## 2019-02-21 NOTE — Progress Notes (Signed)
Carelink Summary Report / Loop Recorder 

## 2019-03-17 ENCOUNTER — Ambulatory Visit (INDEPENDENT_AMBULATORY_CARE_PROVIDER_SITE_OTHER): Payer: Medicare Other | Admitting: *Deleted

## 2019-03-17 DIAGNOSIS — I480 Paroxysmal atrial fibrillation: Secondary | ICD-10-CM

## 2019-03-18 LAB — CUP PACEART REMOTE DEVICE CHECK
Date Time Interrogation Session: 20200921164108
Implantable Pulse Generator Implant Date: 20190924

## 2019-03-25 NOTE — Progress Notes (Signed)
Carelink Summary Report / Loop Recorder 

## 2019-03-31 ENCOUNTER — Ambulatory Visit (HOSPITAL_COMMUNITY)
Admission: RE | Admit: 2019-03-31 | Discharge: 2019-03-31 | Disposition: A | Discharge status: Home / Self Care | Payer: Medicare Other | Source: Ambulatory Visit | Attending: Physician Assistant | Admitting: Physician Assistant

## 2019-03-31 ENCOUNTER — Encounter (HOSPITAL_COMMUNITY): Payer: Self-pay | Admitting: Physician Assistant

## 2019-03-31 ENCOUNTER — Telehealth (HOSPITAL_COMMUNITY): Payer: Self-pay | Admitting: Physician Assistant

## 2019-03-31 ENCOUNTER — Other Ambulatory Visit: Payer: Self-pay

## 2019-03-31 VITALS — BP 130/72 | HR 76 | Ht 76.0 in | Wt 253.6 lb

## 2019-03-31 DIAGNOSIS — E119 Type 2 diabetes mellitus without complications: Secondary | ICD-10-CM | POA: Diagnosis not present

## 2019-03-31 DIAGNOSIS — G4733 Obstructive sleep apnea (adult) (pediatric): Secondary | ICD-10-CM | POA: Diagnosis not present

## 2019-03-31 DIAGNOSIS — I2510 Atherosclerotic heart disease of native coronary artery without angina pectoris: Secondary | ICD-10-CM | POA: Diagnosis not present

## 2019-03-31 DIAGNOSIS — Z79899 Other long term (current) drug therapy: Secondary | ICD-10-CM | POA: Diagnosis not present

## 2019-03-31 DIAGNOSIS — Z8249 Family history of ischemic heart disease and other diseases of the circulatory system: Secondary | ICD-10-CM | POA: Diagnosis not present

## 2019-03-31 DIAGNOSIS — E785 Hyperlipidemia, unspecified: Secondary | ICD-10-CM | POA: Diagnosis not present

## 2019-03-31 DIAGNOSIS — Z7901 Long term (current) use of anticoagulants: Secondary | ICD-10-CM | POA: Diagnosis not present

## 2019-03-31 DIAGNOSIS — I10 Essential (primary) hypertension: Secondary | ICD-10-CM | POA: Diagnosis not present

## 2019-03-31 DIAGNOSIS — I4819 Other persistent atrial fibrillation: Secondary | ICD-10-CM

## 2019-03-31 DIAGNOSIS — E039 Hypothyroidism, unspecified: Secondary | ICD-10-CM | POA: Diagnosis not present

## 2019-03-31 MED ORDER — AMIODARONE HCL 200 MG PO TABS: 200.0000 mg | ORAL_TABLET | Freq: Two times a day (BID) | ORAL | 2 refills | Status: DC

## 2019-03-31 NOTE — Telephone Encounter (Signed)
Error

## 2019-03-31 NOTE — Progress Notes (Signed)
Primary Care Physician: [name], MD Primary Cardiologist: Dr [name] Primary Electrophysiologist: Dr [name] Referring Physician: Dr [name] is a 68 y.o. male with a history of CAD, HTN, DM, HLD, OSA, pharyngeal cancer, and persistent atrial fibrillation who presents for follow up in the [facility]. Patient reports that over the weekend, he started having symptoms of fatigue and overall not feeling well. ILR interrogation showed persistent afib since the morning of 03/29/19. He denies any specific triggers. He does report that he has not been using his CPAP.  Today, he denies symptoms of palpitations, chest pain, shortness of breath, orthopnea, PND, presyncope, syncope, bleeding, or neurologic sequela. The patient is tolerating medications without difficulties and is otherwise without complaint today.    Atrial Fibrillation Risk Factors:  he does have symptoms or diagnosis of sleep apnea. he is not compliant with CPAP therapy.   he has a BMI of Body mass index is 30.87 kg/m.Marland Kitchen Filed Weights   03/31/19 1447  Weight: 115 kg    Family History  Problem Relation Age of Onset  . Breast cancer Mother   . Coronary artery disease Father      Atrial Fibrillation Management history:  Previous antiarrhythmic drugs: amiodarone Previous cardioversions: 2017 Previous ablations: none CHADS2VASC score: 4 Anticoagulation history: warfarin   Past Medical History:  Diagnosis Date  . CAD (coronary artery disease)    70% LCx stenosis, treated medically  . Diabetes mellitus   . Encounter for therapeutic drug monitoring 07/22/2013  . Essential hypertension 03/10/2014  . GERD (gastroesophageal reflux disease)   . Hyperlipidemia 03/10/2014  . Hypertension   . Hypotension   . Hypothyroidism 03/10/2014  . Left atrial enlargement   . Obstructive sleep apnea 03/10/2014  . On Coumadin for atrial fibrillation ([place]) 04/02/2013  . Persistent atrial  fibrillation ([place])   . Pharyngeal cancer ([place]) 03/10/2014   Squamous cell, treated with radiation and chemotherapy by Dr. Valere Dross and [facility]   . Sleep apnea   . 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 Uncomplicated type 2 diabetes mellitus ([place]) 03/10/2014   Past Surgical History:  Procedure Laterality Date  . BREAST SURGERY    . CARDIAC CATHETERIZATION    . CARDIOVERSION N/A 10/14/2015   Procedure: CARDIOVERSION;  Surgeon: Lelon Perla, MD;  Location[facility] ENDOSCOPY;  Service: Cardiovascular;  Laterality: N/A;  . COLONOSCOPY WITH PROPOFOL N/A 06/03/2015   Procedure: COLONOSCOPY WITH PROPOFOL;  Surgeon: Laurence Spates, MD;  Location: [facility] ENDOSCOPY;  Service: Endoscopy;  Laterality: N/A;  . COLONOSCOPY WITH PROPOFOL N/A 06/12/2018   Procedure: COLONOSCOPY WITH PROPOFOL;  Surgeon: Laurence Spates, MD;  Location: [facility] ENDOSCOPY;  Service: Endoscopy;  Laterality: N/A;  . HOT HEMOSTASIS N/A 06/03/2015   Procedure: HOT HEMOSTASIS (ARGON PLASMA COAGULATION/BICAP);  Surgeon: Laurence Spates, MD;  Location: [facility] ENDOSCOPY;  Service: Endoscopy;  Laterality: N/A;  . LOOP RECORDER INSERTION N/A 03/19/2018   Procedure: LOOP RECORDER INSERTION;  Surgeon: Thompson Grayer, MD;  Location: [place] CV LAB;  Service: Cardiovascular;  Laterality: N/A;    Current Outpatient Medications  Medication Sig Dispense Refill  . acetaminophen (TYLENOL) 500 MG tablet Take 500 mg by mouth 2 (two) times daily.    Marland Kitchen amiodarone (PACERONE) 200 MG tablet Take 1 tablet (200 mg total) by mouth 2 (two) times daily. 90 tablet 2  . amLODipine (NORVASC) 5 MG tablet Take 1 tablet (5 mg total) by mouth daily. 180 tablet 3  .  desonide (DESOWEN) 0.05 % cream Apply 1 application topically 2 (two) times daily as needed (dry skin). Behind ears.     . doxazosin (CARDURA) 4 MG tablet Take 4 mg by mouth at bedtime.     . hydrochlorothiazide (HYDRODIURIL) 25 MG tablet Take 1 tablet (25 mg total) by mouth daily. 90 tablet 3  .  levothyroxine (SYNTHROID, LEVOTHROID) 100 MCG tablet Take 100 mcg by mouth daily before breakfast.    . liothyronine (CYTOMEL) 5 MCG tablet Take 5 mcg by mouth daily before breakfast.     . pravastatin (PRAVACHOL) 80 MG tablet Take 80 mg by mouth at bedtime.     . quinapril (ACCUPRIL) 20 MG tablet Take 1 tablet (20 mg total) by mouth 2 (two) times a day. 270 tablet 3  . warfarin (COUMADIN) 5 MG tablet TAKE BY MOUTH DAILY AS DIRECTED BY COUMADIN CLINIC 90 tablet 1   No current facility-administered medications for this encounter.     Allergies  Allergen Reactions  . Nsaids Other (See Comments)    REACTION: PER MD REQUEST NOT TO TAKE    Social History   Socioeconomic History  . Marital status: Widowed    Spouse name: Not on file  . Number of children: Not on file  . Years of education: Not on file  . Highest education level: Not on file  Occupational History  . Not on file  Social Needs  . Financial resource strain: Not on file  . Food insecurity    Worry: Not on file    Inability: Not on file  . Transportation needs    Medical: Not on file    Non-medical: Not on file  Tobacco Use  . Smoking status: Never Smoker  . Smokeless tobacco: Never Used  Substance and Sexual Activity  . Alcohol use: No    Alcohol/week: 0.0 standard drinks  . Drug use: No  . Sexual activity: Not on file  Lifestyle  . Physical activity    Days per week: Not on file    Minutes per session: Not on file  . Stress: Not on file  Relationships  . Social Herbalist on phone: Not on file    Gets together: Not on file    Attends religious service: Not on file    Active member of club or organization: Not on file    Attends meetings of clubs or organizations: Not on file    Relationship status: Not on file  . Intimate partner violence    Fear of current or ex partner: Not on file    Emotionally abused: Not on file    Physically abused: Not on file    Forced sexual activity: Not on file   Other Topics Concern  . Not on file  Social History Narrative  . Not on file     ROS- All systems are reviewed and negative except as per the HPI above.  Physical Exam: Vitals:   03/31/19 1447  BP: 130/72  Pulse: 76  Weight: 115 kg  Height: 6\' 4"  (1.93 m)    GEN- The patient is well appearing obese male, alert and oriented x 3 today.   Head- normocephalic, atraumatic Eyes-  Sclera clear, conjunctiva pink Ears- hearing intact Oropharynx- clear Neck- supple  Lungs- Clear to ausculation bilaterally, normal work of breathing Heart- irregular rate and rhythm, no murmurs, rubs or gallops  GI- soft, NT, ND, + BS Extremities- no clubbing, cyanosis. Trace bilateral edema MS- no significant  deformity or atrophy Skin- no rash or lesion Psych- euthymic mood, full affect Neuro- strength and sensation are intact  Wt Readings from Last 3 Encounters:  03/31/19 115 kg  02/19/19 110.6 kg  01/14/19 110.1 kg    EKG today demonstrates afib HR 76, LAFB, slow R wave prog, QRS 94, QTc 438  Echo 09/01/18 demonstrated  1. The left ventricle has low normal systolic function, with an ejection fraction of 50-55%. The cavity size was normal. There is mild concentric left ventricular hypertrophy. Left ventricular diastology could not be evaluated secondary to atrial  fibrillation.  2. The right ventricle has mildly reduced systolic function. The cavity was mildly enlarged. There is no increase in right ventricular wall thickness. Right ventricular systolic pressure is mildly elevated with an estimated pressure of 37.3 mmHg.  3. Left atrial size was moderately dilated.  4. Right atrial size was mildly dilated.  5. The mitral valve is normal in structure.  6. The tricuspid valve is normal in structure.  7. The aortic valve is tricuspid.  8. The pulmonic valve was normal in structure.  9. The inferior vena cava was dilated in size with <50% respiratory variability.  Epic records are reviewed at  length today  Assessment and Plan:  1. Persistent atrial fibrillation Patient noted to be in afib since 10/3 with symptoms of fatigue. After discussing therapeutic options, will increase amiodarone to 200 mg BID.  Continue warfarin. Will request weekly INRs incase DCCV is needed. Recent TSH/LFTs reviewed. We also discussed ablation. Patient had previously been considered for the procedure but declined 2/2 his wife's poor health. He is willing to revisit this again with Dr [name] now.  This patients CHA2DS2-VASc Score and unadjusted Ischemic Stroke Rate (% per year) is equal to 4.8 % stroke rate/year from a score of 4  Above score calculated as 1 point each if present [CHF, HTN, DM, Vascular=MI/PAD/Aortic Plaque, Age if 65-74, or Male] Above score calculated as 2 points each if present [Age > 75, or Stroke/TIA/TE]   2. Obesity Body mass index is 30.87 kg/m. Lifestyle modification was discussed at length including regular exercise and weight reduction.   3. Obstructive sleep apnea The importance of adequate treatment of sleep apnea was discussed today in order to improve our ability to maintain sinus rhythm long term.  Patient has not been using his CPAP. He is willing to try it again. We discussed that the success of an ablation procedure is much better if his OSA is treated. Patient voices understanding.  4. CAD No anginal symptoms. Continue present therapy and risk factor modification.  5. HTN Stable, no changes today.   Follow up in the AF clinic in 2 weeks. Dr [name] as scheduled.    [facility] [address] [place], [place] [address] [phone] 03/31/2019 3:35 PM

## 2019-03-31 NOTE — Patient Instructions (Signed)
Increase amiodarone to 200mg  twice a day  Weekly INR checks

## 2019-04-01 ENCOUNTER — Ambulatory Visit (INDEPENDENT_AMBULATORY_CARE_PROVIDER_SITE_OTHER): Payer: Medicare Other | Admitting: Pharmacist

## 2019-04-01 DIAGNOSIS — Z5181 Encounter for therapeutic drug level monitoring: Secondary | ICD-10-CM

## 2019-04-01 DIAGNOSIS — I480 Paroxysmal atrial fibrillation: Secondary | ICD-10-CM

## 2019-04-01 LAB — POCT INR: INR: 1.8 — AB (ref 2.0–3.0)

## 2019-04-01 NOTE — Patient Instructions (Signed)
Description   Take 1 tablet today and then continue on same dosage 1/2 tablet daily except 1 tablet on Mondays and Fridays. Recheck in 1 week due to cardioversion.

## 2019-04-07 ENCOUNTER — Other Ambulatory Visit: Payer: Self-pay | Admitting: Internal Medicine

## 2019-04-08 ENCOUNTER — Ambulatory Visit (INDEPENDENT_AMBULATORY_CARE_PROVIDER_SITE_OTHER): Payer: Medicare Other | Admitting: *Deleted

## 2019-04-08 ENCOUNTER — Other Ambulatory Visit: Payer: Self-pay

## 2019-04-08 DIAGNOSIS — Z5181 Encounter for therapeutic drug level monitoring: Secondary | ICD-10-CM

## 2019-04-08 DIAGNOSIS — I480 Paroxysmal atrial fibrillation: Secondary | ICD-10-CM | POA: Diagnosis not present

## 2019-04-08 LAB — POCT INR: INR: 2.1 (ref 2.0–3.0)

## 2019-04-08 NOTE — Patient Instructions (Signed)
Description    Continue on same dosage 1/2 tablet daily except 1 tablet on Mondays and Fridays. Recheck in 1 week due to cardioversion.

## 2019-04-10 ENCOUNTER — Telehealth (HOSPITAL_COMMUNITY): Payer: Self-pay

## 2019-04-10 NOTE — Telephone Encounter (Signed)
Patient called because he is concerned regarding his blood pressure readings. The readings have been fluctuating between 110/67 and he has had several high readings of 148/80 and 153/90. He is currently not having any shortness of breath or chest pain. Advised patient to send his blood pressure readings to Dr. [name] and he has a follow up on Monday. Consulted with patient and he understood.

## 2019-04-14 ENCOUNTER — Other Ambulatory Visit: Payer: Self-pay

## 2019-04-14 ENCOUNTER — Encounter (HOSPITAL_COMMUNITY): Payer: Self-pay | Admitting: Physician Assistant

## 2019-04-14 ENCOUNTER — Ambulatory Visit (HOSPITAL_COMMUNITY)
Admission: RE | Admit: 2019-04-14 | Discharge: 2019-04-14 | Disposition: A | Discharge status: Home / Self Care | Payer: Medicare Other | Source: Ambulatory Visit | Attending: Physician Assistant | Admitting: Physician Assistant

## 2019-04-14 VITALS — BP 162/96 | HR 78 | Ht 76.0 in | Wt 255.6 lb

## 2019-04-14 DIAGNOSIS — I4819 Other persistent atrial fibrillation: Secondary | ICD-10-CM | POA: Insufficient documentation

## 2019-04-14 DIAGNOSIS — Z9221 Personal history of antineoplastic chemotherapy: Secondary | ICD-10-CM | POA: Insufficient documentation

## 2019-04-14 DIAGNOSIS — K219 Gastro-esophageal reflux disease without esophagitis: Secondary | ICD-10-CM | POA: Insufficient documentation

## 2019-04-14 DIAGNOSIS — Z7901 Long term (current) use of anticoagulants: Secondary | ICD-10-CM | POA: Insufficient documentation

## 2019-04-14 DIAGNOSIS — E039 Hypothyroidism, unspecified: Secondary | ICD-10-CM | POA: Diagnosis not present

## 2019-04-14 DIAGNOSIS — Z79890 Hormone replacement therapy (postmenopausal): Secondary | ICD-10-CM | POA: Diagnosis not present

## 2019-04-14 DIAGNOSIS — I10 Essential (primary) hypertension: Secondary | ICD-10-CM | POA: Insufficient documentation

## 2019-04-14 DIAGNOSIS — Z85818 Personal history of malignant neoplasm of other sites of lip, oral cavity, and pharynx: Secondary | ICD-10-CM | POA: Diagnosis not present

## 2019-04-14 DIAGNOSIS — G4733 Obstructive sleep apnea (adult) (pediatric): Secondary | ICD-10-CM | POA: Insufficient documentation

## 2019-04-14 DIAGNOSIS — Z8249 Family history of ischemic heart disease and other diseases of the circulatory system: Secondary | ICD-10-CM | POA: Diagnosis not present

## 2019-04-14 DIAGNOSIS — Z6831 Body mass index (BMI) 31.0-31.9, adult: Secondary | ICD-10-CM | POA: Insufficient documentation

## 2019-04-14 DIAGNOSIS — E669 Obesity, unspecified: Secondary | ICD-10-CM | POA: Insufficient documentation

## 2019-04-14 DIAGNOSIS — I2510 Atherosclerotic heart disease of native coronary artery without angina pectoris: Secondary | ICD-10-CM | POA: Insufficient documentation

## 2019-04-14 DIAGNOSIS — E785 Hyperlipidemia, unspecified: Secondary | ICD-10-CM | POA: Insufficient documentation

## 2019-04-14 DIAGNOSIS — Z923 Personal history of irradiation: Secondary | ICD-10-CM | POA: Diagnosis not present

## 2019-04-14 DIAGNOSIS — Z886 Allergy status to analgesic agent status: Secondary | ICD-10-CM | POA: Diagnosis not present

## 2019-04-14 DIAGNOSIS — Z79899 Other long term (current) drug therapy: Secondary | ICD-10-CM | POA: Insufficient documentation

## 2019-04-14 MED ORDER — AMLODIPINE BESYLATE 5 MG PO TABS: 10.0000 mg | ORAL_TABLET | Freq: Every day | ORAL | 3 refills | Status: DC

## 2019-04-14 NOTE — Patient Instructions (Signed)
Increase amlodipine 10mg  once a day  Continue amiodarone 200mg  twice a day for 2 more weeks.  Scheduling will call you regarding appt with DR. Allred

## 2019-04-14 NOTE — Progress Notes (Signed)
Primary Care Physician: [name], MD Primary Cardiologist: Dr [name] Primary Electrophysiologist: Dr [name] Referring Physician: Dr [name] is a 68 y.o. male with a history of CAD, HTN, DM, HLD, OSA, pharyngeal cancer, and persistent atrial fibrillation who presents for follow up in the [facility]. Patient reports that he has done reasonably well since his last visit. He feels that he has been in and out of rhythm with "good and bad days". ILR today shows that he has had days of SR in the last two weeks. He also reports that he is trying to use his CPAP again.   Today, he denies symptoms of palpitations, chest pain, shortness of breath, orthopnea, PND, presyncope, syncope, bleeding, or neurologic sequela. The patient is tolerating medications without difficulties and is otherwise without complaint today. +fatigue   Atrial Fibrillation Risk Factors:  he does have symptoms or diagnosis of sleep apnea. he is not compliant with CPAP therapy.   he has a BMI of Body mass index is 31.11 kg/m.Marland Kitchen Filed Weights   04/14/19 0830  Weight: 115.9 kg    Family History  Problem Relation Age of Onset  . Breast cancer Mother   . Coronary artery disease Father      Atrial Fibrillation Management history:  Previous antiarrhythmic drugs: amiodarone Previous cardioversions: 2017 Previous ablations: none CHADS2VASC score: 4 Anticoagulation history: warfarin   Past Medical History:  Diagnosis Date  . CAD (coronary artery disease)    70% LCx stenosis, treated medically  . Diabetes mellitus   . Encounter for therapeutic drug monitoring 07/22/2013  . Essential hypertension 03/10/2014  . GERD (gastroesophageal reflux disease)   . Hyperlipidemia 03/10/2014  . Hypertension   . Hypotension   . Hypothyroidism 03/10/2014  . Left atrial enlargement   . Obstructive sleep apnea 03/10/2014  . On Coumadin for atrial fibrillation ([place]) 04/02/2013  .  Persistent atrial fibrillation ([place])   . 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 Valere Dross and [facility]   . Sleep apnea   . 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 Uncomplicated type 2 diabetes mellitus ([place]) 03/10/2014   Past Surgical History:  Procedure Laterality Date  . BREAST SURGERY    . CARDIAC CATHETERIZATION    . CARDIOVERSION N/A 10/14/2015   Procedure: CARDIOVERSION;  Surgeon: Lelon Perla, MD;  Location: [facility] ENDOSCOPY;  Service: Cardiovascular;  Laterality: N/A;  . COLONOSCOPY WITH PROPOFOL N/A 06/03/2015   Procedure: COLONOSCOPY WITH PROPOFOL;  Surgeon: Laurence Spates, MD;  Location: [facility] ENDOSCOPY;  Service: Endoscopy;  Laterality: N/A;  . COLONOSCOPY WITH PROPOFOL N/A 06/12/2018   Procedure: COLONOSCOPY WITH PROPOFOL;  Surgeon: Laurence Spates, MD;  Location: [facility] ENDOSCOPY;  Service: Endoscopy;  Laterality: N/A;  . HOT HEMOSTASIS N/A 06/03/2015   Procedure: HOT HEMOSTASIS (ARGON PLASMA COAGULATION/BICAP);  Surgeon: Laurence Spates, MD;  Location: [facility] ENDOSCOPY;  Service: Endoscopy;  Laterality: N/A;  . LOOP RECORDER INSERTION N/A 03/19/2018   Procedure: LOOP RECORDER INSERTION;  Surgeon: Thompson Grayer, MD;  Location: [place] CV LAB;  Service: Cardiovascular;  Laterality: N/A;    Current Outpatient Medications  Medication Sig Dispense Refill  . acetaminophen (TYLENOL) 500 MG tablet Take 500 mg by mouth 2 (two) times daily.    Marland Kitchen amiodarone (PACERONE) 200 MG tablet Take 1 tablet twice a day for 1 month then reduce to 1 tablet a day 135 tablet 0  . amLODipine (  NORVASC) 5 MG tablet Take 2 tablets (10 mg total) by mouth daily. 180 tablet 3  . desonide (DESOWEN) 0.05 % cream Apply 1 application topically 2 (two) times daily as needed (dry skin). Behind ears.     . doxazosin (CARDURA) 4 MG tablet Take 4 mg by mouth at bedtime.     . hydrochlorothiazide (HYDRODIURIL) 25 MG tablet Take 1 tablet (25 mg total) by  mouth daily. 90 tablet 3  . levothyroxine (SYNTHROID, LEVOTHROID) 100 MCG tablet Take 100 mcg by mouth daily before breakfast.    . liothyronine (CYTOMEL) 5 MCG tablet Take 5 mcg by mouth daily before breakfast.     . loratadine (CLARITIN) 5 MG chewable tablet Chew 5 mg by mouth daily.    . pravastatin (PRAVACHOL) 80 MG tablet Take 80 mg by mouth at bedtime.     . quinapril (ACCUPRIL) 20 MG tablet Take 1 tablet (20 mg total) by mouth 2 (two) times a day. 270 tablet 3  . warfarin (COUMADIN) 5 MG tablet TAKE BY MOUTH DAILY AS DIRECTED BY COUMADIN CLINIC 90 tablet 1   No current facility-administered medications for this encounter.     Allergies  Allergen Reactions  . Nsaids Other (See Comments)    REACTION: PER MD REQUEST NOT TO TAKE    Social History   Socioeconomic History  . Marital status: Widowed    Spouse name: Not on file  . Number of children: Not on file  . Years of education: Not on file  . Highest education level: Not on file  Occupational History  . Not on file  Social Needs  . Financial resource strain: Not on file  . Food insecurity    Worry: Not on file    Inability: Not on file  . Transportation needs    Medical: Not on file    Non-medical: Not on file  Tobacco Use  . Smoking status: Never Smoker  . Smokeless tobacco: Never Used  Substance and Sexual Activity  . Alcohol use: No    Alcohol/week: 0.0 standard drinks  . Drug use: No  . Sexual activity: Not on file  Lifestyle  . Physical activity    Days per week: Not on file    Minutes per session: Not on file  . Stress: Not on file  Relationships  . Social Herbalist on phone: Not on file    Gets together: Not on file    Attends religious service: Not on file    Active member of club or organization: Not on file    Attends meetings of clubs or organizations: Not on file    Relationship status: Not on file  . Intimate partner violence    Fear of current or ex partner: Not on file     Emotionally abused: Not on file    Physically abused: Not on file    Forced sexual activity: Not on file  Other Topics Concern  . Not on file  Social History Narrative  . Not on file     ROS- All systems are reviewed and negative except as per the HPI above.  Physical Exam: Vitals:   04/14/19 0830  BP: (!) 162/96  Pulse: 78  Weight: 115.9 kg  Height: 6\' 4"  (1.93 m)   GEN- The patient is well appearing obese male, alert and oriented x 3 today.   HEENT-head normocephalic, atraumatic, sclera clear, conjunctiva pink, hearing intact, trachea midline. Lungs- Clear to ausculation bilaterally, normal work of  breathing Heart- irregular rate and rhythm, no murmurs, rubs or gallops  GI- soft, NT, ND, + BS Extremities- no clubbing, cyanosis, or edema MS- no significant deformity or atrophy Skin- no rash or lesion Psych- euthymic mood, full affect Neuro- strength and sensation are intact   Wt Readings from Last 3 Encounters:  04/14/19 115.9 kg  03/31/19 115 kg  02/19/19 110.6 kg    EKG today demonstrates afib HR 78, LAFB, PVC, QRS 96, QTc 503  Echo 09/01/18 demonstrated  1. The left ventricle has low normal systolic function, with an ejection fraction of 50-55%. The cavity size was normal. There is mild concentric left ventricular hypertrophy. Left ventricular diastology could not be evaluated secondary to atrial  fibrillation.  2. The right ventricle has mildly reduced systolic function. The cavity was mildly enlarged. There is no increase in right ventricular wall thickness. Right ventricular systolic pressure is mildly elevated with an estimated pressure of 37.3 mmHg.  3. Left atrial size was moderately dilated.  4. Right atrial size was mildly dilated.  5. The mitral valve is normal in structure.  6. The tricuspid valve is normal in structure.  7. The aortic valve is tricuspid.  8. The pulmonic valve was normal in structure.  9. The inferior vena cava was dilated in size with  <50% respiratory variability.  Epic records are reviewed at length today  Assessment and Plan:  1. Persistent atrial fibrillation Increased AF burden on ILR but with periods of SR. Continue amiodarone 200 mg BID for two weeks then decrease to 200 daily. Continue warfarin.  Will not pursue DCCV at this point given periods of SR. We discussed ablation again today and patient is agreeable. Patient had previously been considered for the procedure but declined 2/2 his wife's poor health.  This patients CHA2DS2-VASc Score and unadjusted Ischemic Stroke Rate (% per year) is equal to 4.8 % stroke rate/year from a score of 4  Above score calculated as 1 point each if present [CHF, HTN, DM, Vascular=MI/PAD/Aortic Plaque, Age if 65-74, or Male] Above score calculated as 2 points each if present [Age > 75, or Stroke/TIA/TE]   2. Obesity Body mass index is 31.11 kg/m. Lifestyle modification was discussed and encouraged including regular physical activity and weight reduction.   3. Obstructive sleep apnea The importance of adequate treatment of sleep apnea was discussed today in order to improve our ability to maintain sinus rhythm long term. Patient has ordered new parts for his CPAP and is trying to get in the habit of using it again. If he cannot tolerate, could consider referral for oral device.  4. CAD No anginal symptoms. Continue present therapy and risk factor modification.   5. HTN Elevated today and has been running high at home. Will increase amlodipine to 10 mg daily.   Follow up with Dr [name] to discuss ablation. Dr [name] as scheduled.    [facility] [address] [place], [place] [address] [phone] 04/14/2019 9:10 AM

## 2019-04-15 ENCOUNTER — Ambulatory Visit (INDEPENDENT_AMBULATORY_CARE_PROVIDER_SITE_OTHER): Payer: Medicare Other | Admitting: *Deleted

## 2019-04-15 DIAGNOSIS — I480 Paroxysmal atrial fibrillation: Secondary | ICD-10-CM

## 2019-04-15 DIAGNOSIS — Z5181 Encounter for therapeutic drug level monitoring: Secondary | ICD-10-CM | POA: Diagnosis not present

## 2019-04-15 LAB — POCT INR: INR: 1.9 — AB (ref 2.0–3.0)

## 2019-04-15 NOTE — Patient Instructions (Signed)
Description   Take 1 tablet today, then continue taking 1/2 a tablet daily except for 1 tablet on Monday and Friday. Pt stated that his dose of amio increased in October 2020. Recheck Inr in 1 week, pre DCCV. Coumadin Clinic [phone]

## 2019-04-16 ENCOUNTER — Telehealth: Payer: Self-pay | Admitting: Internal Medicine

## 2019-04-16 ENCOUNTER — Encounter: Payer: Self-pay | Admitting: Internal Medicine

## 2019-04-16 ENCOUNTER — Telehealth (INDEPENDENT_AMBULATORY_CARE_PROVIDER_SITE_OTHER): Payer: Medicare Other | Admitting: Internal Medicine

## 2019-04-16 VITALS — BP 143/74 | HR 64 | Ht 76.0 in | Wt 250.0 lb

## 2019-04-16 DIAGNOSIS — I4819 Other persistent atrial fibrillation: Secondary | ICD-10-CM

## 2019-04-16 DIAGNOSIS — Z79899 Other long term (current) drug therapy: Secondary | ICD-10-CM

## 2019-04-16 DIAGNOSIS — G4733 Obstructive sleep apnea (adult) (pediatric): Secondary | ICD-10-CM

## 2019-04-16 DIAGNOSIS — I259 Chronic ischemic heart disease, unspecified: Secondary | ICD-10-CM | POA: Diagnosis not present

## 2019-04-16 DIAGNOSIS — I10 Essential (primary) hypertension: Secondary | ICD-10-CM | POA: Diagnosis not present

## 2019-04-16 NOTE — Progress Notes (Signed)
Electrophysiology TeleHealth Note   Due to national recommendations of social distancing due to COVID 19, an audio/video telehealth visit is felt to be most appropriate for this patient at this time.  See MyChart message from today for the patient's consent to telehealth for [facility].    Date:  04/16/2019   ID:  [name], DOB 12/17/1950, MRN [id number]  Location: patient's home  Provider location:  [facility]  Evaluation Performed: Follow-up visit  PCP:  [name], MD   Electrophysiologist:  Dr [name]  Chief Complaint:  palpitations  History of Present Illness:    TODD WEIHER is a 68 y.o. male who presents via telehealth conferencing today.  Since last being seen in our clinic, the patient reports doing reasonably well.  He has had persistent afib over the last 2 weeks.  + fatigue and decreased exercise tolerance.  Today, he denies symptoms of palpitations, chest pain, shortness of breath,  lower extremity edema, dizziness, presyncope, or syncope.  The patient is otherwise without complaint today.  The patient denies symptoms of fevers, chills, cough, or new SOB worrisome for COVID 19.  Past Medical History:  Diagnosis Date  . CAD (coronary artery disease)    70% LCx stenosis, treated medically  . Diabetes mellitus   . Encounter for therapeutic drug monitoring 07/22/2013  . Essential hypertension 03/10/2014  . GERD (gastroesophageal reflux disease)   . Hyperlipidemia 03/10/2014  . Hypertension   . Hypotension   . Hypothyroidism 03/10/2014  . Left atrial enlargement   . Obstructive sleep apnea 03/10/2014  . On Coumadin for atrial fibrillation ([place]) 04/02/2013  . Persistent atrial fibrillation ([place])   . 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 Valere Dross and [facility]   . Sleep apnea   . 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 Uncomplicated type 2 diabetes mellitus ([place]) 03/10/2014     Past Surgical History:  Procedure Laterality Date  . BREAST SURGERY    . CARDIAC CATHETERIZATION    . CARDIOVERSION N/A 10/14/2015   Procedure: CARDIOVERSION;  Surgeon: Lelon Perla, MD;  Location[facility] ENDOSCOPY;  Service: Cardiovascular;  Laterality: N/A;  . COLONOSCOPY WITH PROPOFOL N/A 06/03/2015   Procedure: COLONOSCOPY WITH PROPOFOL;  Surgeon: Laurence Spates, MD;  Location: [facility] ENDOSCOPY;  Service: Endoscopy;  Laterality: N/A;  . COLONOSCOPY WITH PROPOFOL N/A 06/12/2018   Procedure: COLONOSCOPY WITH PROPOFOL;  Surgeon: Laurence Spates, MD;  Location: [facility] ENDOSCOPY;  Service: Endoscopy;  Laterality: N/A;  . HOT HEMOSTASIS N/A 06/03/2015   Procedure: HOT HEMOSTASIS (ARGON PLASMA COAGULATION/BICAP);  Surgeon: Laurence Spates, MD;  Location: [facility] ENDOSCOPY;  Service: Endoscopy;  Laterality: N/A;  . LOOP RECORDER INSERTION N/A 03/19/2018   Procedure: LOOP RECORDER INSERTION;  Surgeon: Thompson Grayer, MD;  Location: [place] CV LAB;  Service: Cardiovascular;  Laterality: N/A;    Current Outpatient Medications  Medication Sig Dispense Refill  . acetaminophen (TYLENOL) 500 MG tablet Take 500 mg by mouth 2 (two) times daily.    Marland Kitchen amiodarone (PACERONE) 200 MG tablet Take 1 tablet twice a day for 1 month then reduce to 1 tablet a day 135 tablet 0  . amLODipine (NORVASC) 5 MG tablet Take 2 tablets (10 mg total) by mouth daily. 180 tablet 3  . desonide (DESOWEN) 0.05 % cream Apply 1 application topically 2 (two) times daily as needed (dry skin). Behind ears.     . doxazosin (CARDURA) 4  MG tablet Take 4 mg by mouth at bedtime.     Marland Kitchen levothyroxine (SYNTHROID, LEVOTHROID) 100 MCG tablet Take 100 mcg by mouth daily before breakfast.    . liothyronine (CYTOMEL) 5 MCG tablet Take 5 mcg by mouth daily before breakfast.     . loratadine (CLARITIN) 5 MG chewable tablet Chew 5 mg by mouth daily.    . pravastatin (PRAVACHOL) 80 MG tablet Take 80 mg by mouth at bedtime.     . quinapril (ACCUPRIL) 20 MG tablet  Take 1 tablet (20 mg total) by mouth 2 (two) times a day. 270 tablet 3  . warfarin (COUMADIN) 5 MG tablet TAKE BY MOUTH DAILY AS DIRECTED BY COUMADIN CLINIC 90 tablet 1  . hydrochlorothiazide (HYDRODIURIL) 25 MG tablet Take 1 tablet (25 mg total) by mouth daily. 90 tablet 3   No current facility-administered medications for this visit.     Allergies:   Nsaids   Social History:  The patient  reports that he has never smoked. He has never used smokeless tobacco. He reports that he does not drink alcohol or use drugs.   Family History:  The patient's family history includes Breast cancer in his mother; Coronary artery disease in his father.   ROS:  Please see the history of present illness.   All other systems are personally reviewed and negative.    Exam:    Vital Signs:  BP (!) 143/74   Pulse 64   Ht 6\' 4"  (1.93 m)   Wt 250 lb (113.4 kg)   BMI 30.43 kg/m   Well sounding and appearing, alert and conversant, regular work of breathing,  good skin color Eyes- anicteric, neuro- grossly intact, skin- no apparent rash or lesions or cyanosis, mouth- oral mucosa is pink  Labs/Other Tests and Data Reviewed:    Recent Labs: 08/28/2018: B Natriuretic Peptide 105.8 09/04/2018: Magnesium 1.8 01/14/2019: ALT 17; Hemoglobin 13.9; Platelets 162 02/19/2019: BUN 22; Creatinine, Ser 1.75; Potassium 4.4; Sodium 137; TSH 4.340   Wt Readings from Last 3 Encounters:  04/16/19 250 lb (113.4 kg)  04/14/19 255 lb 9.6 oz (115.9 kg)  03/31/19 253 lb 9.6 oz (115 kg)     Last device remote is reviewed from [place] PDF which reveals normal device function,   ASSESSMENT & PLAN:    1.  Persistent afib The patient has symptomatic, recurrent persistent atrial fibrillation. he has failed medical therapy with amiodarone. Chads2vasc score is 4.  he is anticoagulated with coumadin . Therapeutic strategies for afib including medicine and ablation were discussed in detail with the patient today. Risk, benefits,  and alternatives to EP study and radiofrequency ablation for afib were also discussed in detail today. These risks include but are not limited to stroke, bleeding, vascular damage, tamponade, perforation, damage to the esophagus, lungs, and other structures, pulmonary vein stenosis, worsening renal function, and death. The patient understands these risk and wishes to think about this further.    I have advised that he should strongly consider proceeding on with either cardioversion or ablation sooner than later.  I discussed him that if he waits, eventually we will lose our window to return to sinus.  If we decide to proceed, he would require Carto, ICE, anesthesia for the procedure. We would also need to consider TEE vs cardiac CT to evaluate his LAA for thrombus  -->His INR has been recently subtherapeutic.  I would therefore advise switching him to xarelto for the procedure.  2. Obesity Lifestyle modification discussed  3. OSA Compliance is advised.  He states that he has "new equipment coming in" and will try.  4. HTN Stable No change required today  5. CAD No ischemic symptoms   He wishes to followup with Dr [name] to get his opinion.  He will contact my office if he decides to proceed with ablation.  Patient Risk:  after full review of this patients clinical status, I feel that they are at high risk at this time.  Today, I have spent 15 minutes with the patient with telehealth technology discussing arrhythmia management .    Army Fossa, MD  04/16/2019 9:18 AM     [facility] [address] [place] [address] [phone] (office) [phone] (fax)

## 2019-04-20 LAB — CUP PACEART REMOTE DEVICE CHECK
Date Time Interrogation Session: 20201024164121
Implantable Pulse Generator Implant Date: 20190924

## 2019-04-21 ENCOUNTER — Ambulatory Visit (INDEPENDENT_AMBULATORY_CARE_PROVIDER_SITE_OTHER): Payer: Medicare Other | Admitting: *Deleted

## 2019-04-21 DIAGNOSIS — I480 Paroxysmal atrial fibrillation: Secondary | ICD-10-CM

## 2019-04-22 ENCOUNTER — Other Ambulatory Visit: Payer: Self-pay

## 2019-04-22 ENCOUNTER — Ambulatory Visit (INDEPENDENT_AMBULATORY_CARE_PROVIDER_SITE_OTHER): Payer: Medicare Other | Admitting: *Deleted

## 2019-04-22 DIAGNOSIS — I480 Paroxysmal atrial fibrillation: Secondary | ICD-10-CM

## 2019-04-22 DIAGNOSIS — Z5181 Encounter for therapeutic drug level monitoring: Secondary | ICD-10-CM

## 2019-04-22 LAB — POCT INR: INR: 2.2 (ref 2.0–3.0)

## 2019-04-22 NOTE — Patient Instructions (Addendum)
Description   Continue taking 1/2 a tablet daily except for 1 tablet on Monday and Friday. Recheck Inr in 1 week, possible ablation. Coumadin Clinic [phone]

## 2019-04-29 ENCOUNTER — Other Ambulatory Visit: Payer: Self-pay

## 2019-04-29 ENCOUNTER — Ambulatory Visit (INDEPENDENT_AMBULATORY_CARE_PROVIDER_SITE_OTHER): Payer: Medicare Other | Admitting: *Deleted

## 2019-04-29 DIAGNOSIS — I480 Paroxysmal atrial fibrillation: Secondary | ICD-10-CM

## 2019-04-29 DIAGNOSIS — Z5181 Encounter for therapeutic drug level monitoring: Secondary | ICD-10-CM

## 2019-04-29 LAB — POCT INR: INR: 2.4 (ref 2.0–3.0)

## 2019-04-29 NOTE — Patient Instructions (Signed)
Description   Continue taking 1/2 a tablet daily except for 1 tablet on Monday and Friday. Recheck Inr in 1 week, possible ablation. Coumadin Clinic [phone]
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
## 2019-05-05 ENCOUNTER — Other Ambulatory Visit: Payer: Self-pay

## 2019-05-05 ENCOUNTER — Ambulatory Visit (INDEPENDENT_AMBULATORY_CARE_PROVIDER_SITE_OTHER): Payer: Medicare Other | Admitting: Interventional Cardiology

## 2019-05-05 ENCOUNTER — Ambulatory Visit (INDEPENDENT_AMBULATORY_CARE_PROVIDER_SITE_OTHER): Payer: Medicare Other | Admitting: *Deleted

## 2019-05-05 ENCOUNTER — Encounter: Payer: Self-pay | Admitting: Interventional Cardiology

## 2019-05-05 VITALS — BP 128/72 | HR 56 | Ht 76.0 in | Wt 255.4 lb

## 2019-05-05 DIAGNOSIS — I480 Paroxysmal atrial fibrillation: Secondary | ICD-10-CM | POA: Diagnosis not present

## 2019-05-05 DIAGNOSIS — Z79899 Other long term (current) drug therapy: Secondary | ICD-10-CM

## 2019-05-05 DIAGNOSIS — Z5181 Encounter for therapeutic drug level monitoring: Secondary | ICD-10-CM

## 2019-05-05 DIAGNOSIS — I4891 Unspecified atrial fibrillation: Secondary | ICD-10-CM

## 2019-05-05 DIAGNOSIS — Z7189 Other specified counseling: Secondary | ICD-10-CM | POA: Diagnosis not present

## 2019-05-05 DIAGNOSIS — E119 Type 2 diabetes mellitus without complications: Secondary | ICD-10-CM | POA: Diagnosis not present

## 2019-05-05 DIAGNOSIS — G4733 Obstructive sleep apnea (adult) (pediatric): Secondary | ICD-10-CM | POA: Diagnosis not present

## 2019-05-05 DIAGNOSIS — I10 Essential (primary) hypertension: Secondary | ICD-10-CM

## 2019-05-05 DIAGNOSIS — Z7901 Long term (current) use of anticoagulants: Secondary | ICD-10-CM | POA: Diagnosis not present

## 2019-05-05 DIAGNOSIS — I259 Chronic ischemic heart disease, unspecified: Secondary | ICD-10-CM | POA: Diagnosis not present

## 2019-05-05 LAB — POCT INR: INR: 2.5 (ref 2.0–3.0)

## 2019-05-05 NOTE — Patient Instructions (Signed)

## 2019-05-05 NOTE — Patient Instructions (Signed)
Description   Continue taking 1/2 a tablet daily except for 1 tablet on Monday and Friday. Recheck Inr in 1 week, possible ablation. Coumadin Clinic [phone]

## 2019-05-09 NOTE — Progress Notes (Signed)
Carelink Summary Report / Loop Recorder 

## 2019-05-12 ENCOUNTER — Other Ambulatory Visit: Payer: Self-pay

## 2019-05-12 ENCOUNTER — Telehealth: Payer: Self-pay

## 2019-05-12 ENCOUNTER — Ambulatory Visit (INDEPENDENT_AMBULATORY_CARE_PROVIDER_SITE_OTHER): Payer: Medicare Other | Admitting: Pharmacist

## 2019-05-12 DIAGNOSIS — Z5181 Encounter for therapeutic drug level monitoring: Secondary | ICD-10-CM | POA: Diagnosis not present

## 2019-05-12 DIAGNOSIS — I480 Paroxysmal atrial fibrillation: Secondary | ICD-10-CM | POA: Diagnosis not present

## 2019-05-12 DIAGNOSIS — I4819 Other persistent atrial fibrillation: Secondary | ICD-10-CM

## 2019-05-12 LAB — POCT INR: INR: 2.3 (ref 2.0–3.0)

## 2019-05-12 MED ORDER — RIVAROXABAN 20 MG PO TABS: 20.0000 mg | ORAL_TABLET | Freq: Every day | ORAL | 5 refills | Status: DC

## 2019-05-12 NOTE — Telephone Encounter (Signed)
Needs to be scheduled for afib ablation.  1.  Needs to change to Xarelto  2. Cardiac CT vs TEE?   Need to confirm above and then schedule

## 2019-05-12 NOTE — Telephone Encounter (Signed)
Pt with coag visit today.  Will follow up after visit.

## 2019-05-12 NOTE — Patient Instructions (Signed)
Stop taking Coumadin tonight, start the Xarelto (rivaroxaban) tonight. Take 20 mg once daily with largest meal of the day. Call the Coumadin Clinic if you have any questions [phone]

## 2019-05-13 MED ORDER — METOPROLOL TARTRATE 100 MG PO TABS: ORAL_TABLET | ORAL | 0 refills | Status: DC

## 2019-05-13 NOTE — Telephone Encounter (Signed)
Call placed to Pt  Pt scheduled for afib ablation on December 11  Labs/covid ordered.  Instruction letters complete  Work up complete.

## 2019-05-14 DIAGNOSIS — I4891 Unspecified atrial fibrillation: Secondary | ICD-10-CM | POA: Diagnosis not present

## 2019-05-14 DIAGNOSIS — E118 Type 2 diabetes mellitus with unspecified complications: Secondary | ICD-10-CM | POA: Diagnosis not present

## 2019-05-14 DIAGNOSIS — E039 Hypothyroidism, unspecified: Secondary | ICD-10-CM | POA: Diagnosis not present

## 2019-05-14 DIAGNOSIS — I10 Essential (primary) hypertension: Secondary | ICD-10-CM | POA: Diagnosis not present

## 2019-05-14 DIAGNOSIS — E1122 Type 2 diabetes mellitus with diabetic chronic kidney disease: Secondary | ICD-10-CM | POA: Diagnosis not present

## 2019-05-14 DIAGNOSIS — E782 Mixed hyperlipidemia: Secondary | ICD-10-CM | POA: Diagnosis not present

## 2019-05-14 DIAGNOSIS — N189 Chronic kidney disease, unspecified: Secondary | ICD-10-CM | POA: Diagnosis not present

## 2019-05-14 DIAGNOSIS — I480 Paroxysmal atrial fibrillation: Secondary | ICD-10-CM | POA: Diagnosis not present

## 2019-05-14 DIAGNOSIS — I25119 Atherosclerotic heart disease of native coronary artery with unspecified angina pectoris: Secondary | ICD-10-CM | POA: Diagnosis not present

## 2019-05-16 ENCOUNTER — Other Ambulatory Visit: Payer: Self-pay

## 2019-05-16 ENCOUNTER — Ambulatory Visit (INDEPENDENT_AMBULATORY_CARE_PROVIDER_SITE_OTHER): Payer: Medicare Other | Admitting: Podiatry

## 2019-05-16 ENCOUNTER — Encounter: Payer: Self-pay | Admitting: Podiatry

## 2019-05-16 DIAGNOSIS — B351 Tinea unguium: Secondary | ICD-10-CM | POA: Diagnosis not present

## 2019-05-16 DIAGNOSIS — M79675 Pain in left toe(s): Secondary | ICD-10-CM

## 2019-05-16 DIAGNOSIS — M79674 Pain in right toe(s): Secondary | ICD-10-CM

## 2019-05-16 DIAGNOSIS — E1142 Type 2 diabetes mellitus with diabetic polyneuropathy: Secondary | ICD-10-CM | POA: Diagnosis not present

## 2019-05-16 DIAGNOSIS — D689 Coagulation defect, unspecified: Secondary | ICD-10-CM

## 2019-05-16 NOTE — Progress Notes (Signed)
Complaint:  Visit Type: Patient returns to my office for continued preventative foot care services. Complaint: Patient states" my nails have grown long and thick and become painful to walk and wear shoes" Patient has been diagnosed with DM with no foot complications. The patient presents for preventative foot care services. No changes to ROS  Podiatric Exam: Vascular: dorsalis pedis and posterior tibial pulses are palpable bilateral. Capillary return is immediate. Temperature gradient is WNL. Skin turgor WNL  Sensorium: Normal Semmes Weinstein monofilament test. Normal tactile sensation bilaterally. Nail Exam: Pt has thick disfigured discolored nails with subungual debris noted bilateral entire nail hallux through fifth toenails Ulcer Exam: There is no evidence of ulcer or pre-ulcerative changes or infection. Orthopedic Exam: Muscle tone and strength are WNL. No limitations in general ROM. No crepitus or effusions noted. Foot type and digits show no abnormalities. Bony prominences are unremarkable.  Hallux malleus right hallux.  Hammer toes 2-5  B/L Skin: No Porokeratosis. No infection or ulcers  Diagnosis:  Onychomycosis, , Pain in right toe, pain in left toes  Treatment & Plan Procedures and Treatment: Consent by patient was obtained for treatment procedures.   Debridement of mycotic and hypertrophic toenails, 1 through 5 bilateral and clearing of subungual debris. No ulceration, no infection noted.  Return Visit-Office Procedure: Patient instructed to return to the office for a follow up visit 3 months for continued evaluation and treatment.    Keric Zehren DPM 

## 2019-05-20 DIAGNOSIS — N179 Acute kidney failure, unspecified: Secondary | ICD-10-CM | POA: Diagnosis not present

## 2019-05-20 DIAGNOSIS — E118 Type 2 diabetes mellitus with unspecified complications: Secondary | ICD-10-CM | POA: Diagnosis not present

## 2019-05-20 DIAGNOSIS — Z8679 Personal history of other diseases of the circulatory system: Secondary | ICD-10-CM | POA: Diagnosis not present

## 2019-05-20 DIAGNOSIS — I480 Paroxysmal atrial fibrillation: Secondary | ICD-10-CM | POA: Diagnosis not present

## 2019-05-20 DIAGNOSIS — I10 Essential (primary) hypertension: Secondary | ICD-10-CM | POA: Diagnosis not present

## 2019-05-25 LAB — CUP PACEART REMOTE DEVICE CHECK
Date Time Interrogation Session: 20201126114114
Implantable Pulse Generator Implant Date: 20190924

## 2019-05-26 ENCOUNTER — Ambulatory Visit (INDEPENDENT_AMBULATORY_CARE_PROVIDER_SITE_OTHER): Payer: Medicare Other | Admitting: *Deleted

## 2019-05-26 DIAGNOSIS — I480 Paroxysmal atrial fibrillation: Secondary | ICD-10-CM

## 2019-05-29 ENCOUNTER — Telehealth: Payer: Self-pay | Admitting: Internal Medicine

## 2019-05-29 NOTE — Telephone Encounter (Signed)
New Message    Pt is calling to speak with Myrtie Hawk about his up coming procedure    Please call

## 2019-05-30 ENCOUNTER — Telehealth (HOSPITAL_COMMUNITY): Payer: Self-pay | Admitting: Emergency Medicine

## 2019-05-30 NOTE — Telephone Encounter (Signed)
Left message on voicemail with name and callback number Frank Bond RN Navigator Cardiac Imaging [facility] [phone] Office [phone] Cell   Also offered patient earlier appt slot (8:30a), will await a callback.

## 2019-05-30 NOTE — Telephone Encounter (Signed)
Pt returning phone call regarding upcoming cardiac imaging study; pt verbalizes understanding of appt date/time, parking situation and where to check in, pre-test NPO status and medications ordered, and verified current allergies; name and call back number provided for further questions should they arise Frank Bond RN Navigator Cardiac Imaging [facility] [phone] office [phone] cell  Pt willing to take 8:30a slot for CT2, Scheduling and CT made aware of changes

## 2019-05-30 NOTE — Telephone Encounter (Signed)
Follow-up:  Patient would like more instructions about after his ablation, specifically about who would need to pick him up, and if anyone would need to be there during his procedure.  The patient's sister works from home wand will be able to pick him up. If there has to be someone with him the whole time he will need to make other arrangements, and he will just need to know what to tell his sister

## 2019-05-30 NOTE — Telephone Encounter (Signed)
Left detailed message informing pt that someone does not need to remain at the hospital during his procedure.   Informed that I would try and reach him again today to discuss and answer any questions he may have.

## 2019-05-30 NOTE — Telephone Encounter (Signed)
Spoke to pt. Aware that he will most likely go home same day of procedure. He reports that he lives alone.  Informed that he will need someone to stay with him overnight, after the procedure.  Pt reports that he will stay with his sister after procedure. Pt appreciates my return call.

## 2019-06-02 ENCOUNTER — Other Ambulatory Visit: Payer: Self-pay

## 2019-06-02 ENCOUNTER — Ambulatory Visit (HOSPITAL_COMMUNITY)
Admission: RE | Admit: 2019-06-02 | Discharge: 2019-06-02 | Disposition: A | Discharge status: Home / Self Care | Payer: Medicare Other | Source: Ambulatory Visit | Attending: Internal Medicine | Admitting: Internal Medicine

## 2019-06-02 DIAGNOSIS — I4819 Other persistent atrial fibrillation: Secondary | ICD-10-CM | POA: Diagnosis not present

## 2019-06-02 LAB — POCT I-STAT CREATININE: Creatinine, Ser: 1.9 mg/dL — ABNORMAL HIGH (ref 0.61–1.24)

## 2019-06-02 MED ORDER — IOHEXOL 350 MG/ML SOLN
80.0000 mL | Freq: Once | INTRAVENOUS | Status: AC | PRN
  Administered 2019-06-02: 80 mL via INTRAVENOUS

## 2019-06-03 ENCOUNTER — Other Ambulatory Visit: Payer: Medicare Other

## 2019-06-03 ENCOUNTER — Other Ambulatory Visit (HOSPITAL_COMMUNITY)
Admission: RE | Admit: 2019-06-03 | Discharge: 2019-06-03 | Disposition: A | Discharge status: Home / Self Care | Payer: Medicare Other | Source: Ambulatory Visit | Attending: Internal Medicine | Admitting: Internal Medicine

## 2019-06-03 DIAGNOSIS — Z20828 Contact with and (suspected) exposure to other viral communicable diseases: Secondary | ICD-10-CM | POA: Diagnosis not present

## 2019-06-03 DIAGNOSIS — I4819 Other persistent atrial fibrillation: Secondary | ICD-10-CM

## 2019-06-03 DIAGNOSIS — Z01812 Encounter for preprocedural laboratory examination: Secondary | ICD-10-CM | POA: Insufficient documentation

## 2019-06-03 LAB — BASIC METABOLIC PANEL
BUN/Creatinine Ratio: 12 (ref 10–24)
BUN: 25 mg/dL (ref 8–27)
CO2: 28 mmol/L (ref 20–29)
Calcium: 8.8 mg/dL (ref 8.6–10.2)
Chloride: 104 mmol/L (ref 96–106)
Creatinine, Ser: 2.14 mg/dL — ABNORMAL HIGH (ref 0.76–1.27)
GFR calc Af Amer: 36 mL/min/{1.73_m2} — ABNORMAL LOW (ref >59)
GFR calc non Af Amer: 31 mL/min/{1.73_m2} — ABNORMAL LOW (ref >59)
Glucose: 125 mg/dL — ABNORMAL HIGH (ref 65–99)
Potassium: 4.3 mmol/L (ref 3.5–5.2)
Sodium: 137 mmol/L (ref 134–144)

## 2019-06-03 LAB — CBC WITH DIFFERENTIAL/PLATELET
Basophils Absolute: 0 10*3/uL (ref 0.0–0.2)
Basos: 0 %
EOS (ABSOLUTE): 0.3 10*3/uL (ref 0.0–0.4)
Eos: 5 %
Hematocrit: 38.1 % (ref 37.5–51.0)
Hemoglobin: 13 g/dL (ref 13.0–17.7)
Lymphocytes Absolute: 0.9 10*3/uL (ref 0.7–3.1)
Lymphs: 17 %
MCH: 30.9 pg (ref 26.6–33.0)
MCHC: 34.1 g/dL (ref 31.5–35.7)
MCV: 91 fL (ref 79–97)
Monocytes Absolute: 0.7 10*3/uL (ref 0.1–0.9)
Monocytes: 14 %
Neutrophils Absolute: 3.3 10*3/uL (ref 1.4–7.0)
Neutrophils: 64 %
Platelets: 173 10*3/uL (ref 150–450)
RBC: 4.21 x10E6/uL (ref 4.14–5.80)
RDW: 13.6 % (ref 11.6–15.4)
WBC: 5.2 10*3/uL (ref 3.4–10.8)

## 2019-06-04 LAB — NOVEL CORONAVIRUS, NAA (HOSP ORDER, SEND-OUT TO REF LAB; TAT 18-24 HRS): SARS-CoV-2, NAA: NOT DETECTED

## 2019-06-06 ENCOUNTER — Ambulatory Visit (HOSPITAL_COMMUNITY): Payer: Medicare Other | Admitting: Anesthesiology

## 2019-06-06 ENCOUNTER — Ambulatory Visit (HOSPITAL_COMMUNITY)
Admission: RE | Admit: 2019-06-06 | Discharge: 2019-06-06 | Disposition: A | Discharge status: Home / Self Care | Payer: Medicare Other | Attending: Internal Medicine | Admitting: Internal Medicine

## 2019-06-06 ENCOUNTER — Encounter (HOSPITAL_COMMUNITY)
Admission: RE | Disposition: A | Discharge status: Home / Self Care | Payer: Self-pay | Source: Home / Self Care | Attending: Internal Medicine

## 2019-06-06 ENCOUNTER — Other Ambulatory Visit: Payer: Self-pay

## 2019-06-06 DIAGNOSIS — Z79899 Other long term (current) drug therapy: Secondary | ICD-10-CM | POA: Insufficient documentation

## 2019-06-06 DIAGNOSIS — E119 Type 2 diabetes mellitus without complications: Secondary | ICD-10-CM | POA: Diagnosis not present

## 2019-06-06 DIAGNOSIS — I4819 Other persistent atrial fibrillation: Secondary | ICD-10-CM | POA: Insufficient documentation

## 2019-06-06 DIAGNOSIS — E785 Hyperlipidemia, unspecified: Secondary | ICD-10-CM | POA: Insufficient documentation

## 2019-06-06 DIAGNOSIS — Z8249 Family history of ischemic heart disease and other diseases of the circulatory system: Secondary | ICD-10-CM | POA: Insufficient documentation

## 2019-06-06 DIAGNOSIS — Z7901 Long term (current) use of anticoagulants: Secondary | ICD-10-CM | POA: Insufficient documentation

## 2019-06-06 DIAGNOSIS — Z886 Allergy status to analgesic agent status: Secondary | ICD-10-CM | POA: Diagnosis not present

## 2019-06-06 DIAGNOSIS — I10 Essential (primary) hypertension: Secondary | ICD-10-CM | POA: Insufficient documentation

## 2019-06-06 DIAGNOSIS — K219 Gastro-esophageal reflux disease without esophagitis: Secondary | ICD-10-CM | POA: Insufficient documentation

## 2019-06-06 DIAGNOSIS — G4733 Obstructive sleep apnea (adult) (pediatric): Secondary | ICD-10-CM | POA: Diagnosis not present

## 2019-06-06 DIAGNOSIS — E039 Hypothyroidism, unspecified: Secondary | ICD-10-CM | POA: Diagnosis not present

## 2019-06-06 DIAGNOSIS — I2510 Atherosclerotic heart disease of native coronary artery without angina pectoris: Secondary | ICD-10-CM | POA: Diagnosis not present

## 2019-06-06 HISTORY — PX: ATRIAL FIBRILLATION ABLATION: EP1191

## 2019-06-06 LAB — POCT ACTIVATED CLOTTING TIME: Activated Clotting Time: 307 seconds

## 2019-06-06 LAB — GLUCOSE, CAPILLARY
Glucose-Capillary: 121 mg/dL — ABNORMAL HIGH (ref 70–99)
Glucose-Capillary: 119 mg/dL — ABNORMAL HIGH (ref 70–99)

## 2019-06-06 SURGERY — ATRIAL FIBRILLATION ABLATION
Anesthesia: General

## 2019-06-06 MED ORDER — LIDOCAINE 2% (20 MG/ML) 5 ML SYRINGE
INTRAMUSCULAR | Status: DC | PRN
  Administered 2019-06-06: 100 mg via INTRAVENOUS

## 2019-06-06 MED ORDER — SODIUM CHLORIDE 0.9% FLUSH: 3.0000 mL | Freq: Two times a day (BID) | INTRAVENOUS | Status: DC

## 2019-06-06 MED ORDER — SODIUM CHLORIDE 0.9% FLUSH: 3.0000 mL | INTRAVENOUS | Status: DC | PRN

## 2019-06-06 MED ORDER — HEPARIN (PORCINE) IN NACL 1000-0.9 UT/500ML-% IV SOLN
INTRAVENOUS | Status: DC | PRN
  Administered 2019-06-06: 500 mL

## 2019-06-06 MED ORDER — ONDANSETRON HCL 4 MG/2ML IJ SOLN: 4.0000 mg | Freq: Four times a day (QID) | INTRAMUSCULAR | Status: DC | PRN

## 2019-06-06 MED ORDER — HEPARIN SODIUM (PORCINE) 1000 UNIT/ML IJ SOLN
INTRAMUSCULAR | Status: DC | PRN
  Administered 2019-06-06: 14000 [IU] via INTRAVENOUS
  Administered 2019-06-06: 1000 [IU] via INTRAVENOUS

## 2019-06-06 MED ORDER — HEPARIN (PORCINE) IN NACL 1000-0.9 UT/500ML-% IV SOLN
INTRAVENOUS | Status: AC
  Filled 2019-06-06: qty 500

## 2019-06-06 MED ORDER — DEXAMETHASONE SODIUM PHOSPHATE 10 MG/ML IJ SOLN
INTRAMUSCULAR | Status: DC | PRN
  Administered 2019-06-06: 5 mg via INTRAVENOUS

## 2019-06-06 MED ORDER — ISOPROTERENOL HCL 0.2 MG/ML IJ SOLN
INTRAVENOUS | Status: DC | PRN
  Administered 2019-06-06: 20 ug/kg/min via INTRAVENOUS

## 2019-06-06 MED ORDER — HEPARIN SODIUM (PORCINE) 1000 UNIT/ML IJ SOLN
INTRAMUSCULAR | Status: AC
  Filled 2019-06-06: qty 1

## 2019-06-06 MED ORDER — PANTOPRAZOLE SODIUM 40 MG PO TBEC: 40.0000 mg | DELAYED_RELEASE_TABLET | Freq: Every day | ORAL | 0 refills | Status: DC

## 2019-06-06 MED ORDER — SODIUM CHLORIDE 0.9 % IV SOLN
INTRAVENOUS | Status: DC
  Administered 2019-06-06 (×2): via INTRAVENOUS

## 2019-06-06 MED ORDER — ISOPROTERENOL HCL 0.2 MG/ML IJ SOLN
INTRAMUSCULAR | Status: AC
  Filled 2019-06-06: qty 5

## 2019-06-06 MED ORDER — ACETAMINOPHEN 325 MG PO TABS: 650.0000 mg | ORAL_TABLET | ORAL | Status: DC | PRN

## 2019-06-06 MED ORDER — SODIUM CHLORIDE 0.9 % IV SOLN: 250.0000 mL | INTRAVENOUS | Status: DC | PRN

## 2019-06-06 MED ORDER — HEPARIN SODIUM (PORCINE) 1000 UNIT/ML IJ SOLN
INTRAMUSCULAR | Status: DC | PRN
  Administered 2019-06-06: 2000 [IU] via INTRAVENOUS

## 2019-06-06 MED ORDER — PHENYLEPHRINE HCL-NACL 10-0.9 MG/250ML-% IV SOLN
INTRAVENOUS | Status: DC | PRN
  Administered 2019-06-06: 50 ug/min via INTRAVENOUS

## 2019-06-06 MED ORDER — HYDROCODONE-ACETAMINOPHEN 5-325 MG PO TABS: 1.0000 | ORAL_TABLET | ORAL | Status: DC | PRN

## 2019-06-06 MED ORDER — EPHEDRINE SULFATE 50 MG/ML IJ SOLN
INTRAMUSCULAR | Status: DC | PRN
  Administered 2019-06-06 (×2): 5 mg via INTRAVENOUS
  Administered 2019-06-06: 10 mg via INTRAVENOUS

## 2019-06-06 MED ORDER — FENTANYL CITRATE (PF) 100 MCG/2ML IJ SOLN
INTRAMUSCULAR | Status: DC | PRN
  Administered 2019-06-06: 25 ug via INTRAVENOUS
  Administered 2019-06-06: 100 ug via INTRAVENOUS

## 2019-06-06 MED ORDER — PROPOFOL 10 MG/ML IV BOLUS
INTRAVENOUS | Status: DC | PRN
  Administered 2019-06-06: 150 mg via INTRAVENOUS

## 2019-06-06 MED ORDER — SUCCINYLCHOLINE CHLORIDE 20 MG/ML IJ SOLN
INTRAMUSCULAR | Status: DC | PRN
  Administered 2019-06-06: 120 mg via INTRAVENOUS

## 2019-06-06 MED ORDER — PROTAMINE SULFATE 10 MG/ML IV SOLN
INTRAVENOUS | Status: DC | PRN
  Administered 2019-06-06: 40 mg via INTRAVENOUS

## 2019-06-06 MED ORDER — ROCURONIUM BROMIDE 50 MG/5ML IV SOSY
PREFILLED_SYRINGE | INTRAVENOUS | Status: DC | PRN
  Administered 2019-06-06: 60 mg via INTRAVENOUS
  Administered 2019-06-06: 20 mg via INTRAVENOUS

## 2019-06-06 MED ORDER — LACTATED RINGERS IV SOLN: INTRAVENOUS | Status: DC | PRN

## 2019-06-06 MED ORDER — PHENYLEPHRINE HCL (PRESSORS) 10 MG/ML IV SOLN
INTRAVENOUS | Status: DC | PRN
  Administered 2019-06-06 (×2): 80 ug via INTRAVENOUS

## 2019-06-06 SURGICAL SUPPLY — 17 items
BLANKET WARM UNDERBOD FULL ACC (MISCELLANEOUS) ×3 IMPLANT
CATH MAPPNG PENTARAY F 2-6-2MM (CATHETERS) ×1 IMPLANT
CATH SMTCH THERMOCOOL SF DF (CATHETERS) ×3 IMPLANT
CATH SOUNDSTAR ECO REPROCESSED (CATHETERS) ×3 IMPLANT
CATH WEBSTER BI DIR CS D-F CRV (CATHETERS) ×3 IMPLANT
COVER SWIFTLINK CONNECTOR (BAG) ×3 IMPLANT
NEEDLE BAYLIS TRANSSEPTAL 71CM (NEEDLE) ×3 IMPLANT
PACK EP LATEX FREE (CUSTOM PROCEDURE TRAY) ×2
PACK EP LF (CUSTOM PROCEDURE TRAY) ×1 IMPLANT
PAD PRO RADIOLUCENT 2001M-C (PAD) ×3 IMPLANT
PATCH CARTO3 (PAD) ×3 IMPLANT
PENTARAY F 2-6-2MM (CATHETERS) ×3
SHEATH AVANTI 11F 11CM (SHEATH) ×3 IMPLANT
SHEATH PINNACLE 7F 10CM (SHEATH) ×6 IMPLANT
SHEATH PROBE COVER 6X72 (BAG) ×3 IMPLANT
SHEATH SWARTZ TS SL2 63CM 8.5F (SHEATH) ×3 IMPLANT
TUBING SMART ABLATE COOLFLOW (TUBING) IMPLANT

## 2019-06-06 NOTE — Anesthesia Postprocedure Evaluation (Signed)
Anesthesia Post Note  Patient: JR KOVACICH  Procedure(s) Performed: ATRIAL FIBRILLATION ABLATION (N/A )     Patient location during evaluation: Cath Lab Anesthesia Type: General Level of consciousness: awake and alert Pain management: pain level controlled Vital Signs Assessment: post-procedure vital signs reviewed and stable Respiratory status: spontaneous breathing, nonlabored ventilation and respiratory function stable Cardiovascular status: blood pressure returned to baseline and stable Postop Assessment: no apparent nausea or vomiting Anesthetic complications: no    Last Vitals:  Vitals:   06/06/19 0959 06/06/19 1032  BP: 125/63 (!) 133/58  Pulse: 67 67  Resp: 15 17  Temp: (!) 36.4 C 36.6 C  SpO2:      Last Pain:  Vitals:   06/06/19 1032  TempSrc: Temporal  PainSc: 0-No pain                 Lidia Collum

## 2019-06-06 NOTE — Progress Notes (Signed)
Figure of eight suture was removed gradually, with a small ooze only.  Sterile gauze was applied at the site, and instructions were given to the patient about his bed rest,.

## 2019-06-06 NOTE — Transfer of Care (Signed)
Immediate Anesthesia Transfer of Care Note  Patient: IZEAR IFFLAND  Procedure(s) Performed: ATRIAL FIBRILLATION ABLATION (N/A )  Patient Location: PACU  Anesthesia Type:General  Level of Consciousness: awake, alert  and oriented  Airway & Oxygen Therapy: Patient Spontanous Breathing and Patient connected to nasal cannula oxygen  Post-op Assessment: Report given to RN, Post -op Vital signs reviewed and stable and Patient moving all extremities X 4  Post vital signs: Reviewed and stable  Last Vitals:  Vitals Value Taken Time  BP    Temp    Pulse 66 06/06/19 0959  Resp 16 06/06/19 0959  SpO2 100 % 06/06/19 0959  Vitals shown include unvalidated device data.  Last Pain:  Vitals:   06/06/19 0637  TempSrc:   PainSc: 0-No pain      Patients Stated Pain Goal: 3 ([id number])  Complications: No apparent anesthesia complications

## 2019-06-06 NOTE — Anesthesia Preprocedure Evaluation (Addendum)
Anesthesia Evaluation  Patient identified by MRN, date of birth, ID band Patient awake    Reviewed: Allergy & Precautions, NPO status , Patient's Chart, lab work & pertinent test results, reviewed documented beta blocker date and time   History of Anesthesia Complications Negative for: history of anesthetic complications  Airway Mallampati: II  TM Distance: <3 FB Neck ROM: full    Dental  (+) Edentulous Upper, Edentulous Lower, Dental Advidsory Given   Pulmonary sleep apnea ,    Pulmonary exam normal        Cardiovascular hypertension, Pt. on medications and Pt. on home beta blockers + CAD  + dysrhythmias Atrial Fibrillation  Rhythm:irregular Rate:Normal     Neuro/Psych negative neurological ROS  negative psych ROS   GI/Hepatic Neg liver ROS, GERD  ,  Endo/Other  diabetes, Type 2Hypothyroidism   Renal/GU Renal InsufficiencyRenal disease  negative genitourinary   Musculoskeletal negative musculoskeletal ROS (+)   Abdominal   Peds  Hematology negative hematology ROS (+)   Anesthesia Other Findings H/o pharyngeal cancer s/p chemo/XRT (2015)  Echo 09/01/18: EF [id number], mild RV systolic dysfunction, RVSP 37, valves unremarkable  Reproductive/Obstetrics                           Anesthesia Physical Anesthesia Plan  ASA: III  Anesthesia Plan: General   Post-op Pain Management:    Induction: Intravenous  PONV Risk Score and Plan: 2 and Ondansetron, Dexamethasone, Treatment may vary due to age or medical condition and Midazolam  Airway Management Planned: Oral ETT  Additional Equipment: None  Intra-op Plan:   Post-operative Plan: Extubation in OR  Informed Consent: I have reviewed the patients History and Physical, chart, labs and discussed the procedure including the risks, benefits and alternatives for the proposed anesthesia with the patient or authorized representative who has  indicated his/her understanding and acceptance.     Dental Advisory Given and Dental advisory given  Plan Discussed with:   Anesthesia Plan Comments:       Anesthesia Quick Evaluation

## 2019-06-06 NOTE — Discharge Instructions (Signed)
Post procedure care instructions No driving for 4 days. No lifting over 5 lbs for 1 week. No vigorous or sexual activity for 1 week. You may return to work/your usual activities on 06/13/2019. Keep procedure site clean & dry. If you notice increased pain, swelling, bleeding or pus, call/return!  You may shower, but no soaking baths/hot tubs/pools for 1 week.   You have an appointment set up with the [facility].  Multiple studies have shown that being followed by a dedicated atrial fibrillation clinic in addition to the standard care you receive from your other physicians improves health. We believe that enrollment in the atrial fibrillation clinic will allow [place] to better care for you.   The phone number to the [facility] is [phone]. The clinic is staffed Monday through Friday from 8:30am to 5pm.  Parking Directions: The clinic is located in the Heart and Vascular Building connected to [facility]. 1)From [address] turn on to [address] and go to the 3rd entrance  (Heart and Vascular entrance) on the right. 2)Look to the right for Heart &Vascular Parking Garage. 3) the code for the entrance in January is 6009.   4)Take the elevators to the 1st floor. Registration is in the room with the glass walls at the end of the hallway.  If you have any trouble parking or locating the clinic, please don't hesitate to call [phone].

## 2019-06-06 NOTE — H&P (Signed)
Chief Complaint:  palpitations  History of Present Illness:    [name] is a 68 y.o. male who presents for afib ablation. Since last being seen in our clinic, the patient reports doing reasonably well.  He has had persistent afib over the last 2 months.  + fatigue and decreased exercise tolerance.  Today, he denies symptoms of palpitations, chest pain, shortness of breath,  lower extremity edema, dizziness, presyncope, or syncope.  The patient is otherwise without complaint today.  The patient denies symptoms of fevers, chills, cough, or new SOB worrisome for COVID 19.      Past Medical History:  Diagnosis Date  . CAD (coronary artery disease)    70% LCx stenosis, treated medically  . Diabetes mellitus   . Encounter for therapeutic drug monitoring 07/22/2013  . Essential hypertension 03/10/2014  . GERD (gastroesophageal reflux disease)   . Hyperlipidemia 03/10/2014  . Hypertension   . Hypotension   . Hypothyroidism 03/10/2014  . Left atrial enlargement   . Obstructive sleep apnea 03/10/2014  . On Coumadin for atrial fibrillation ([place]) 04/02/2013  . Persistent atrial fibrillation ([place])   . 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 Valere Dross and [facility]   . Sleep apnea   . 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 Uncomplicated type 2 diabetes mellitus ([place]) 03/10/2014         Past Surgical History:  Procedure Laterality Date  . BREAST SURGERY    . CARDIAC CATHETERIZATION    . CARDIOVERSION N/A 10/14/2015   Procedure: CARDIOVERSION;  Surgeon: Lelon Perla, MD;  Location: [facility] ENDOSCOPY;  Service: Cardiovascular;  Laterality: N/A;  . COLONOSCOPY WITH PROPOFOL N/A 06/03/2015   Procedure: COLONOSCOPY WITH PROPOFOL;  Surgeon: Laurence Spates, MD;  Location: [facility] ENDOSCOPY;  Service: Endoscopy;  Laterality: N/A;  . COLONOSCOPY WITH PROPOFOL N/A 06/12/2018   Procedure: COLONOSCOPY WITH  PROPOFOL;  Surgeon: Laurence Spates, MD;  Location: [facility] ENDOSCOPY;  Service: Endoscopy;  Laterality: N/A;  . HOT HEMOSTASIS N/A 06/03/2015   Procedure: HOT HEMOSTASIS (ARGON PLASMA COAGULATION/BICAP);  Surgeon: Laurence Spates, MD;  Location: [facility] ENDOSCOPY;  Service: Endoscopy;  Laterality: N/A;  . LOOP RECORDER INSERTION N/A 03/19/2018   Procedure: LOOP RECORDER INSERTION;  Surgeon: Thompson Grayer, MD;  Location: [place] CV LAB;  Service: Cardiovascular;  Laterality: N/A;          Current Outpatient Medications  Medication Sig Dispense Refill  . acetaminophen (TYLENOL) 500 MG tablet Take 500 mg by mouth 2 (two) times daily.    Marland Kitchen amiodarone (PACERONE) 200 MG tablet Take 1 tablet twice a day for 1 month then reduce to 1 tablet a day 135 tablet 0  . amLODipine (NORVASC) 5 MG tablet Take 2 tablets (10 mg total) by mouth daily. 180 tablet 3  . desonide (DESOWEN) 0.05 % cream Apply 1 application topically 2 (two) times daily as needed (dry skin). Behind ears.     . doxazosin (CARDURA) 4 MG tablet Take 4 mg by mouth at bedtime.     Marland Kitchen levothyroxine (SYNTHROID, LEVOTHROID) 100 MCG tablet Take 100 mcg by mouth daily before breakfast.    . liothyronine (CYTOMEL) 5 MCG tablet Take 5 mcg by mouth daily before breakfast.     . loratadine (CLARITIN) 5 MG chewable tablet Chew 5 mg by mouth daily.    . pravastatin (PRAVACHOL) 80 MG tablet Take 80 mg by mouth at bedtime.     Marland Kitchen  quinapril (ACCUPRIL) 20 MG tablet Take 1 tablet (20 mg total) by mouth 2 (two) times a day. 270 tablet 3  . warfarin (COUMADIN) 5 MG tablet TAKE BY MOUTH DAILY AS DIRECTED BY COUMADIN CLINIC 90 tablet 1  . hydrochlorothiazide (HYDRODIURIL) 25 MG tablet Take 1 tablet (25 mg total) by mouth daily. 90 tablet 3   No current facility-administered medications for this visit.     Allergies:   Nsaids   Social History:  The patient  reports that he has never smoked. He has never used smokeless tobacco. He reports that he  does not drink alcohol or use drugs.   Family History:  The patient's family history includes Breast cancer in his mother; Coronary artery disease in his father.   ROS:  Please see the history of present illness.   All other systems are personally reviewed and negative.   Physical Exam: Vitals:   06/06/19 0614  BP: 130/73  Pulse: (!) 57  Resp: 17  Temp: 98.2 F (36.8 C)  TempSrc: Skin  SpO2: 100%  Weight: 113.4 kg  Height: 6' 4.5" (1.943 m)    GEN- The patient is well appearing, alert and oriented x 3 today.   Head- normocephalic, atraumatic Eyes-  Sclera clear, conjunctiva pink Ears- hearing intact Oropharynx- clear Neck- supple, Lungs-   normal work of breathing MS- no significant deformity or atrophy Skin- no rash or lesion Psych- euthymic mood, full affect Neuro- strength and sensation are intact   Labs/Other Tests and Data Reviewed:    Recent Labs: 08/28/2018: B Natriuretic Peptide 105.8 09/04/2018: Magnesium 1.8 01/14/2019: ALT 17; Hemoglobin 13.9; Platelets 162 02/19/2019: BUN 22; Creatinine, Ser 1.75; Potassium 4.4; Sodium 137; TSH 4.340      Wt Readings from Last 3 Encounters:  04/16/19 250 lb (113.4 kg)  04/14/19 255 lb 9.6 oz (115.9 kg)  03/31/19 253 lb 9.6 oz (115 kg)      ASSESSMENT & PLAN:    1.  Persistent afib The patient has symptomatic, recurrent persistent atrial fibrillation. he has failed medical therapy with amiodarone. Chads2vasc score is 4.  he is anticoagulated with xarelto He reports compliance with xarelto without interruption.  Cardiac CT was reviewed with him today.  Risk, benefits, and alternatives to EP study and radiofrequency ablation for afib were also discussed in detail today. These risks include but are not limited to stroke, bleeding, vascular damage, tamponade, perforation, damage to the esophagus, lungs, and other structures, pulmonary vein stenosis, worsening renal function, and death. The patient understands these  risk and wishes to proceed.   Thompson Grayer MD, [facility] 06/06/2019 7:36 AM

## 2019-06-06 NOTE — Anesthesia Procedure Notes (Signed)
Procedure Name: Intubation Date/Time: 06/06/2019 7:54 AM Performed by: Neldon Newport, CRNA Pre-anesthesia Checklist: Timeout performed, Patient being monitored, Suction available, Emergency Drugs available and Patient identified Patient Re-evaluated:Patient Re-evaluated prior to induction Oxygen Delivery Method: Circle system utilized Preoxygenation: Pre-oxygenation with 100% oxygen Induction Type: IV induction and Rapid sequence Ventilation: Mask ventilation without difficulty Laryngoscope Size: Mac and 4 Grade View: Grade I Tube type: Oral Tube size: 7.5 mm Number of attempts: 1 Placement Confirmation: ETT inserted through vocal cords under direct vision Secured at: 23 cm Tube secured with: Tape Dental Injury: Teeth and Oropharynx as per pre-operative assessment

## 2019-06-09 ENCOUNTER — Encounter: Payer: Self-pay | Admitting: Cardiology

## 2019-06-17 ENCOUNTER — Other Ambulatory Visit: Payer: Self-pay

## 2019-06-17 NOTE — Telephone Encounter (Signed)
Pt called in stating that he called his pharmacy about his refills on this medication and was informed he had none left. Pt is requesting refills. Please advise.

## 2019-06-18 MED ORDER — AMLODIPINE BESYLATE 5 MG PO TABS: 10.0000 mg | ORAL_TABLET | Freq: Every day | ORAL | 3 refills | Status: DC

## 2019-06-18 NOTE — Progress Notes (Signed)
ILR remote 

## 2019-06-23 ENCOUNTER — Other Ambulatory Visit: Payer: Self-pay

## 2019-06-23 MED ORDER — AMLODIPINE BESYLATE 5 MG PO TABS: 10.0000 mg | ORAL_TABLET | Freq: Every day | ORAL | 3 refills | Status: DC

## 2019-06-26 ENCOUNTER — Ambulatory Visit (INDEPENDENT_AMBULATORY_CARE_PROVIDER_SITE_OTHER): Payer: Medicare Other | Admitting: *Deleted

## 2019-06-26 DIAGNOSIS — I480 Paroxysmal atrial fibrillation: Secondary | ICD-10-CM | POA: Diagnosis not present

## 2019-06-26 LAB — CUP PACEART REMOTE DEVICE CHECK
Date Time Interrogation Session: 20201230231925
Implantable Pulse Generator Implant Date: 20190924

## 2019-06-27 NOTE — Progress Notes (Signed)
ILR remote 

## 2019-07-04 ENCOUNTER — Ambulatory Visit (HOSPITAL_COMMUNITY): Payer: Medicare Other | Admitting: Physician Assistant

## 2019-07-07 DIAGNOSIS — Z8669 Personal history of other diseases of the nervous system and sense organs: Secondary | ICD-10-CM | POA: Diagnosis not present

## 2019-07-07 DIAGNOSIS — E039 Hypothyroidism, unspecified: Secondary | ICD-10-CM | POA: Diagnosis not present

## 2019-07-07 DIAGNOSIS — Z85819 Personal history of malignant neoplasm of unspecified site of lip, oral cavity, and pharynx: Secondary | ICD-10-CM | POA: Diagnosis not present

## 2019-07-07 DIAGNOSIS — Z6831 Body mass index (BMI) 31.0-31.9, adult: Secondary | ICD-10-CM | POA: Diagnosis not present

## 2019-07-07 DIAGNOSIS — I10 Essential (primary) hypertension: Secondary | ICD-10-CM | POA: Diagnosis not present

## 2019-07-07 DIAGNOSIS — Z7189 Other specified counseling: Secondary | ICD-10-CM | POA: Diagnosis not present

## 2019-07-07 DIAGNOSIS — I4891 Unspecified atrial fibrillation: Secondary | ICD-10-CM | POA: Diagnosis not present

## 2019-07-08 ENCOUNTER — Encounter (HOSPITAL_COMMUNITY): Payer: Self-pay | Admitting: Physician Assistant

## 2019-07-08 ENCOUNTER — Ambulatory Visit (HOSPITAL_COMMUNITY)
Admission: RE | Admit: 2019-07-08 | Discharge: 2019-07-08 | Disposition: A | Discharge status: Home / Self Care | Payer: Medicare Other | Source: Ambulatory Visit | Attending: Physician Assistant | Admitting: Physician Assistant

## 2019-07-08 ENCOUNTER — Other Ambulatory Visit: Payer: Self-pay

## 2019-07-08 VITALS — BP 132/64 | HR 62 | Ht 76.5 in | Wt 255.4 lb

## 2019-07-08 DIAGNOSIS — K219 Gastro-esophageal reflux disease without esophagitis: Secondary | ICD-10-CM | POA: Diagnosis not present

## 2019-07-08 DIAGNOSIS — G4733 Obstructive sleep apnea (adult) (pediatric): Secondary | ICD-10-CM | POA: Insufficient documentation

## 2019-07-08 DIAGNOSIS — Z79899 Other long term (current) drug therapy: Secondary | ICD-10-CM | POA: Insufficient documentation

## 2019-07-08 DIAGNOSIS — E669 Obesity, unspecified: Secondary | ICD-10-CM | POA: Insufficient documentation

## 2019-07-08 DIAGNOSIS — E118 Type 2 diabetes mellitus with unspecified complications: Secondary | ICD-10-CM | POA: Insufficient documentation

## 2019-07-08 DIAGNOSIS — E039 Hypothyroidism, unspecified: Secondary | ICD-10-CM | POA: Diagnosis not present

## 2019-07-08 DIAGNOSIS — D6869 Other thrombophilia: Secondary | ICD-10-CM

## 2019-07-08 DIAGNOSIS — Z7901 Long term (current) use of anticoagulants: Secondary | ICD-10-CM | POA: Diagnosis not present

## 2019-07-08 DIAGNOSIS — I4819 Other persistent atrial fibrillation: Secondary | ICD-10-CM | POA: Diagnosis not present

## 2019-07-08 DIAGNOSIS — I2510 Atherosclerotic heart disease of native coronary artery without angina pectoris: Secondary | ICD-10-CM | POA: Insufficient documentation

## 2019-07-08 DIAGNOSIS — E785 Hyperlipidemia, unspecified: Secondary | ICD-10-CM | POA: Insufficient documentation

## 2019-07-08 DIAGNOSIS — Z6830 Body mass index (BMI) 30.0-30.9, adult: Secondary | ICD-10-CM | POA: Diagnosis not present

## 2019-07-08 DIAGNOSIS — I10 Essential (primary) hypertension: Secondary | ICD-10-CM | POA: Diagnosis not present

## 2019-07-08 LAB — COMPREHENSIVE METABOLIC PANEL
ALT: 25 U/L (ref 0–44)
AST: 23 U/L (ref 15–41)
Albumin: 3.9 g/dL (ref 3.5–5.0)
Alkaline Phosphatase: 44 U/L (ref 38–126)
Anion gap: 10 (ref 5–15)
BUN: 22 mg/dL (ref 8–23)
CO2: 21 mmol/L — ABNORMAL LOW (ref 22–32)
Calcium: 8.9 mg/dL (ref 8.9–10.3)
Chloride: 107 mmol/L (ref 98–111)
Creatinine, Ser: 2 mg/dL — ABNORMAL HIGH (ref 0.61–1.24)
GFR calc Af Amer: 39 mL/min — ABNORMAL LOW (ref >60)
GFR calc non Af Amer: 34 mL/min — ABNORMAL LOW (ref >60)
Glucose, Bld: 124 mg/dL — ABNORMAL HIGH (ref 70–99)
Potassium: 4.2 mmol/L (ref 3.5–5.1)
Sodium: 138 mmol/L (ref 135–145)
Total Bilirubin: 0.8 mg/dL (ref 0.3–1.2)
Total Protein: 6.6 g/dL (ref 6.5–8.1)

## 2019-07-08 LAB — TSH: TSH: 4.601 u[IU]/mL — ABNORMAL HIGH (ref 0.350–4.500)

## 2019-07-08 NOTE — Progress Notes (Addendum)
Primary Care Physician: [name], MD Primary Cardiologist: Dr [name] Primary Electrophysiologist: Dr [name] Referring Physician: Dr [name] is a 69 y.o. male with a history of CAD, HTN, DM, HLD, OSA, pharyngeal cancer, and persistent atrial fibrillation who presents for follow up in the [facility]. He is on Xarelto for a CHADS2VASC score of 4. Patient is s/p afib ablation with Dr [name] on 06/06/19. Patient reports that he has done reasonably well. He did note one brief afib episode. He is in SR today. He continues to feel fatigued and his PCP is adjusting his thyroid medications. He also states that he tried using his new mask for his CPAP but it is still intolerable. He follows with Dr Isidoro Donning. He denies CP, swallowing, or groin issues.   Today, he denies symptoms of palpitations, chest pain, shortness of breath, orthopnea, PND, presyncope, syncope, bleeding, or neurologic sequela. The patient is tolerating medications without difficulties and is otherwise without complaint today.   Atrial Fibrillation Risk Factors:  he does have symptoms or diagnosis of sleep apnea. he is not compliant with CPAP therapy.   he has a BMI of Body mass index is 30.68 kg/m.Marland Kitchen Filed Weights   07/08/19 0831  Weight: 115.8 kg    Family History  Problem Relation Age of Onset  . Breast cancer Mother   . Coronary artery disease Father      Atrial Fibrillation Management history:  Previous antiarrhythmic drugs: amiodarone Previous cardioversions: 2017 Previous ablations: 06/06/19 CHADS2VASC score: 4 Anticoagulation history: warfarin   Past Medical History:  Diagnosis Date  . CAD (coronary artery disease)    70% LCx stenosis, treated medically  . Diabetes mellitus   . Encounter for therapeutic drug monitoring 07/22/2013  . Essential hypertension 03/10/2014  . GERD (gastroesophageal reflux disease)   . Hyperlipidemia 03/10/2014  . Hypertension     . Hypotension   . Hypothyroidism 03/10/2014  . Left atrial enlargement   . Obstructive sleep apnea 03/10/2014  . On Coumadin for atrial fibrillation ([place]) 04/02/2013  . Persistent atrial fibrillation ([place])   . 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 Valere Dross and [facility]   . Sleep apnea   . 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 Uncomplicated type 2 diabetes mellitus ([place]) 03/10/2014   Past Surgical History:  Procedure Laterality Date  . ATRIAL FIBRILLATION ABLATION N/A 06/06/2019   Procedure: ATRIAL FIBRILLATION ABLATION;  Surgeon: Thompson Grayer, MD;  Location: [place] CV LAB;  Service: Cardiovascular;  Laterality: N/A;  . BREAST SURGERY    . CARDIAC CATHETERIZATION    . CARDIOVERSION N/A 10/14/2015   Procedure: CARDIOVERSION;  Surgeon: Lelon Perla, MD;  Location: [facility] ENDOSCOPY;  Service: Cardiovascular;  Laterality: N/A;  . COLONOSCOPY WITH PROPOFOL N/A 06/03/2015   Procedure: COLONOSCOPY WITH PROPOFOL;  Surgeon: Laurence Spates, MD;  Location: [facility] ENDOSCOPY;  Service: Endoscopy;  Laterality: N/A;  . COLONOSCOPY WITH PROPOFOL N/A 06/12/2018   Procedure: COLONOSCOPY WITH PROPOFOL;  Surgeon: Laurence Spates, MD;  Location: [facility] ENDOSCOPY;  Service: Endoscopy;  Laterality: N/A;  . HOT HEMOSTASIS N/A 06/03/2015   Procedure: HOT HEMOSTASIS (ARGON PLASMA COAGULATION/BICAP);  Surgeon: Laurence Spates, MD;  Location: [facility] ENDOSCOPY;  Service: Endoscopy;  Laterality: N/A;  . LOOP RECORDER INSERTION N/A 03/19/2018   Procedure: LOOP RECORDER INSERTION;  Surgeon: Thompson Grayer, MD;  Location: [place] CV LAB;  Service: Cardiovascular;  Laterality: N/A;  Current Outpatient Medications  Medication Sig Dispense Refill  . acetaminophen (TYLENOL) 500 MG tablet Take 500 mg by mouth 2 (two) times daily.    Marland Kitchen amiodarone (PACERONE) 200 MG tablet Take 200 mg by mouth daily.    Marland Kitchen amLODipine (NORVASC) 5 MG tablet Take 2 tablets  (10 mg total) by mouth daily. 180 tablet 3  . desonide (DESOWEN) 0.05 % cream Apply 1 application topically 2 (two) times daily as needed (dry skin). Behind ears.     . doxazosin (CARDURA) 4 MG tablet Take 4 mg by mouth at bedtime.     . hydrochlorothiazide (HYDRODIURIL) 25 MG tablet Take 25 mg by mouth daily.    Marland Kitchen levothyroxine (SYNTHROID) 112 MCG tablet Take 112 mcg by mouth daily.    Marland Kitchen liothyronine (CYTOMEL) 5 MCG tablet Take 2.5 mcg by mouth daily before breakfast.     . loratadine (CLARITIN) 10 MG tablet Take 10 mg by mouth daily.     . pantoprazole (PROTONIX) 40 MG tablet Take 1 tablet (40 mg total) by mouth daily. 45 tablet 0  . pravastatin (PRAVACHOL) 80 MG tablet Take 80 mg by mouth at bedtime.     . quinapril (ACCUPRIL) 20 MG tablet Take 1 tablet (20 mg total) by mouth 2 (two) times a day. 270 tablet 3  . rivaroxaban (XARELTO) 20 MG TABS tablet Take 1 tablet (20 mg total) by mouth daily with supper. 30 tablet 5   No current facility-administered medications for this encounter.    Allergies  Allergen Reactions  . Nsaids Other (See Comments)    REACTION: PER MD REQUEST NOT TO TAKE    Social History   Socioeconomic History  . Marital status: Widowed    Spouse name: Not on file  . Number of children: Not on file  . Years of education: Not on file  . Highest education level: Not on file  Occupational History  . Not on file  Tobacco Use  . Smoking status: Never Smoker  . Smokeless tobacco: Never Used  Substance and Sexual Activity  . Alcohol use: No    Alcohol/week: 0.0 standard drinks  . Drug use: No  . Sexual activity: Not on file  Other Topics Concern  . Not on file  Social History Narrative  . Not on file   Social Determinants of Health   Financial Resource Strain:   . Difficulty of Paying Living Expenses: Not on file  Food Insecurity:   . Worried About Charity fundraiser in the Last Year: Not on file  . Ran Out of Food in the Last Year: Not on file    Transportation Needs:   . Lack of Transportation (Medical): Not on file  . Lack of Transportation (Non-Medical): Not on file  Physical Activity:   . Days of Exercise per Week: Not on file  . Minutes of Exercise per Session: Not on file  Stress:   . Feeling of Stress : Not on file  Social Connections:   . Frequency of Communication with Friends and Family: Not on file  . Frequency of Social Gatherings with Friends and Family: Not on file  . Attends Religious Services: Not on file  . Active Member of Clubs or Organizations: Not on file  . Attends Archivist Meetings: Not on file  . Marital Status: Not on file  Intimate Partner Violence:   . Fear of Current or Ex-Partner: Not on file  . Emotionally Abused: Not on file  . Physically Abused:  Not on file  . Sexually Abused: Not on file     ROS- All systems are reviewed and negative except as per the HPI above.  Physical Exam: Vitals:   07/08/19 0831  BP: 132/64  Pulse: 62  Weight: 115.8 kg  Height: 6' 4.5" (1.943 m)    GEN- The patient is well appearing obese male, alert and oriented x 3 today.   HEENT-head normocephalic, atraumatic, sclera clear, conjunctiva pink, hearing intact, trachea midline. Lungs- Clear to ausculation bilaterally, normal work of breathing Heart- Regular rate and rhythm, no murmurs, rubs or gallops  GI- soft, NT, ND, + BS Extremities- no clubbing, cyanosis, or edema MS- no significant deformity or atrophy Skin- no rash or lesion Psych- euthymic mood, full affect Neuro- strength and sensation are intact   Wt Readings from Last 3 Encounters:  07/08/19 115.8 kg  06/06/19 113.4 kg  05/05/19 115.8 kg    EKG today demonstrates SR HR 62, 1st degree AV block, PR 216, QRS 96, QTc 458  Echo 09/01/18 demonstrated  1. The left ventricle has low normal systolic function, with an ejection fraction of 50-55%. The cavity size was normal. There is mild concentric left ventricular hypertrophy. Left  ventricular diastology could not be evaluated secondary to atrial  fibrillation.  2. The right ventricle has mildly reduced systolic function. The cavity was mildly enlarged. There is no increase in right ventricular wall thickness. Right ventricular systolic pressure is mildly elevated with an estimated pressure of 37.3 mmHg.  3. Left atrial size was moderately dilated.  4. Right atrial size was mildly dilated.  5. The mitral valve is normal in structure.  6. The tricuspid valve is normal in structure.  7. The aortic valve is tricuspid.  8. The pulmonic valve was normal in structure.  9. The inferior vena cava was dilated in size with <50% respiratory variability.  Epic records are reviewed at length today  Assessment and Plan:  1. Persistent atrial fibrillation S/p afib ablation with Dr [name] on 06/06/19. Patient appears to be maintaining SR.  Continue amiodarone 200 mg daily. Check Cmet/TSH today. Continue Xarelto 20 mg daily for at least 3 months post ablation with no missed doses. Lifestyle changes as below.  This patients CHA2DS2-VASc Score and unadjusted Ischemic Stroke Rate (% per year) is equal to 4.8 % stroke rate/year from a score of 4  Above score calculated as 1 point each if present [CHF, HTN, DM, Vascular=MI/PAD/Aortic Plaque, Age if 65-74, or Male] Above score calculated as 2 points each if present [Age > 75, or Stroke/TIA/TE]   2. Obesity Body mass index is 30.68 kg/m. Lifestyle modification was discussed and encouraged including regular physical activity and weight reduction.  3. Obstructive sleep apnea The importance of adequate treatment of sleep apnea was discussed today in order to improve our ability to maintain sinus rhythm long term. Intolerant of CPAP. Encouraged patient to make appointment with Dr Isidoro Donning to discuss options for his OSA.   4. CAD No anginal symptoms. Followed by Dr [name].  5. HTN Stable, no changes today.   Follow up with Dr  [name] as scheduled.    [facility] [address] [place], [place] [address] [phone] 07/08/2019 10:16 AM

## 2019-07-19 ENCOUNTER — Other Ambulatory Visit: Payer: Self-pay | Admitting: Internal Medicine

## 2019-07-24 DIAGNOSIS — E118 Type 2 diabetes mellitus with unspecified complications: Secondary | ICD-10-CM | POA: Diagnosis not present

## 2019-07-24 DIAGNOSIS — I480 Paroxysmal atrial fibrillation: Secondary | ICD-10-CM | POA: Diagnosis not present

## 2019-07-24 DIAGNOSIS — E039 Hypothyroidism, unspecified: Secondary | ICD-10-CM | POA: Diagnosis not present

## 2019-07-24 DIAGNOSIS — I4891 Unspecified atrial fibrillation: Secondary | ICD-10-CM | POA: Diagnosis not present

## 2019-07-24 DIAGNOSIS — I25119 Atherosclerotic heart disease of native coronary artery with unspecified angina pectoris: Secondary | ICD-10-CM | POA: Diagnosis not present

## 2019-07-24 DIAGNOSIS — E1122 Type 2 diabetes mellitus with diabetic chronic kidney disease: Secondary | ICD-10-CM | POA: Diagnosis not present

## 2019-07-24 DIAGNOSIS — I10 Essential (primary) hypertension: Secondary | ICD-10-CM | POA: Diagnosis not present

## 2019-07-24 DIAGNOSIS — E782 Mixed hyperlipidemia: Secondary | ICD-10-CM | POA: Diagnosis not present

## 2019-07-24 DIAGNOSIS — N189 Chronic kidney disease, unspecified: Secondary | ICD-10-CM | POA: Diagnosis not present

## 2019-07-25 ENCOUNTER — Ambulatory Visit: Payer: Medicare Other

## 2019-07-25 ENCOUNTER — Telehealth: Payer: Self-pay

## 2019-07-25 NOTE — Telephone Encounter (Signed)
Pt called asking if he needs to continue taking Pantoprazole. He said he was told by Dr [name] to take it for 45 days after his ablation. When he picked up his prescriptions he had another one for this med. I advised him that he could keep it on hand prn but to discuss this with Dr [name] at his f/u appt on 3/8.

## 2019-07-26 ENCOUNTER — Other Ambulatory Visit: Payer: Self-pay | Admitting: Nurse Practitioner

## 2019-07-28 ENCOUNTER — Ambulatory Visit (INDEPENDENT_AMBULATORY_CARE_PROVIDER_SITE_OTHER): Payer: Medicare Other | Admitting: *Deleted

## 2019-07-28 DIAGNOSIS — I480 Paroxysmal atrial fibrillation: Secondary | ICD-10-CM

## 2019-07-28 LAB — CUP PACEART REMOTE DEVICE CHECK
Date Time Interrogation Session: 20210201001606
Implantable Pulse Generator Implant Date: 20190924

## 2019-07-28 NOTE — Progress Notes (Signed)
ILR Remote 

## 2019-08-02 ENCOUNTER — Ambulatory Visit: Payer: Medicare Other | Attending: Internal Medicine

## 2019-08-02 DIAGNOSIS — Z23 Encounter for immunization: Secondary | ICD-10-CM | POA: Insufficient documentation

## 2019-08-02 NOTE — Progress Notes (Signed)
   Covid-19 Vaccination Clinic  Name:  [name]    MRN: [id number] DOB: 1950-09-26  08/02/2019  [name] was observed post Covid-19 immunization for 15 minutes without incidence. He was provided with Vaccine Information Sheet and instruction to access the V-Safe system.   [name] was instructed to call 911 with any severe reactions post vaccine: Marland Kitchen Difficulty breathing  . Swelling of your face and throat  . A fast heartbeat  . A bad rash all over your body  . Dizziness and weakness    Immunizations Administered    Name Date Dose VIS Date Route   Pfizer COVID-19 Vaccine 08/02/2019  4:21 PM 0.3 mL 06/06/2019 Intramuscular   Manufacturer: [place]   Lot: [id number]   [place]: [id number]

## 2019-08-08 ENCOUNTER — Ambulatory Visit (INDEPENDENT_AMBULATORY_CARE_PROVIDER_SITE_OTHER): Payer: Medicare Other | Admitting: Podiatry

## 2019-08-08 ENCOUNTER — Other Ambulatory Visit: Payer: Self-pay

## 2019-08-08 ENCOUNTER — Encounter: Payer: Self-pay | Admitting: Podiatry

## 2019-08-08 DIAGNOSIS — D689 Coagulation defect, unspecified: Secondary | ICD-10-CM

## 2019-08-08 DIAGNOSIS — M79675 Pain in left toe(s): Secondary | ICD-10-CM | POA: Diagnosis not present

## 2019-08-08 DIAGNOSIS — M79674 Pain in right toe(s): Secondary | ICD-10-CM

## 2019-08-08 DIAGNOSIS — B351 Tinea unguium: Secondary | ICD-10-CM | POA: Diagnosis not present

## 2019-08-08 DIAGNOSIS — E1142 Type 2 diabetes mellitus with diabetic polyneuropathy: Secondary | ICD-10-CM

## 2019-08-08 NOTE — Progress Notes (Signed)
Complaint:  Visit Type: Patient returns to my office for continued preventative foot care services. Complaint: Patient states" my nails have grown long and thick and become painful to walk and wear shoes" Patient has been diagnosed with DM with no foot complications. The patient presents for preventative foot care services. No changes to ROS  Podiatric Exam: Vascular: dorsalis pedis and posterior tibial pulses are palpable bilateral. Capillary return is immediate. Temperature gradient is WNL. Skin turgor WNL  Sensorium: Normal Semmes Weinstein monofilament test. Normal tactile sensation bilaterally. Nail Exam: Pt has thick disfigured discolored nails with subungual debris noted bilateral entire nail hallux through fifth toenails Ulcer Exam: There is no evidence of ulcer or pre-ulcerative changes or infection. Orthopedic Exam: Muscle tone and strength are WNL. No limitations in general ROM. No crepitus or effusions noted. Foot type and digits show no abnormalities. Bony prominences are unremarkable.  Hallux malleus right hallux.  Hammer toes 2-5 .  Mild  HAV  B/L. B/L Skin: No Porokeratosis. No infection or ulcers  Diagnosis:  Onychomycosis, , Pain in right toe, pain in left toes  Treatment & Plan Procedures and Treatment: Consent by patient was obtained for treatment procedures.   Debridement of mycotic and hypertrophic toenails, 1 through 5 bilateral and clearing of subungual debris. No ulceration, no infection noted.  Return Visit-Office Procedure: Patient instructed to return to the office for a follow up visit 3 months for continued evaluation and treatment.    Gardiner Barefoot DPM

## 2019-08-15 ENCOUNTER — Ambulatory Visit: Payer: Medicare Other

## 2019-08-18 DIAGNOSIS — H35033 Hypertensive retinopathy, bilateral: Secondary | ICD-10-CM | POA: Diagnosis not present

## 2019-08-18 DIAGNOSIS — H1131 Conjunctival hemorrhage, right eye: Secondary | ICD-10-CM | POA: Diagnosis not present

## 2019-08-18 DIAGNOSIS — E113393 Type 2 diabetes mellitus with moderate nonproliferative diabetic retinopathy without macular edema, bilateral: Secondary | ICD-10-CM | POA: Diagnosis not present

## 2019-08-18 DIAGNOSIS — H26493 Other secondary cataract, bilateral: Secondary | ICD-10-CM | POA: Diagnosis not present

## 2019-08-20 DIAGNOSIS — E039 Hypothyroidism, unspecified: Secondary | ICD-10-CM | POA: Diagnosis not present

## 2019-08-20 DIAGNOSIS — E782 Mixed hyperlipidemia: Secondary | ICD-10-CM | POA: Diagnosis not present

## 2019-08-20 DIAGNOSIS — Z1159 Encounter for screening for other viral diseases: Secondary | ICD-10-CM | POA: Diagnosis not present

## 2019-08-20 DIAGNOSIS — E118 Type 2 diabetes mellitus with unspecified complications: Secondary | ICD-10-CM | POA: Diagnosis not present

## 2019-08-20 DIAGNOSIS — I25119 Atherosclerotic heart disease of native coronary artery with unspecified angina pectoris: Secondary | ICD-10-CM | POA: Diagnosis not present

## 2019-08-20 DIAGNOSIS — Z125 Encounter for screening for malignant neoplasm of prostate: Secondary | ICD-10-CM | POA: Diagnosis not present

## 2019-08-20 DIAGNOSIS — I10 Essential (primary) hypertension: Secondary | ICD-10-CM | POA: Diagnosis not present

## 2019-08-20 DIAGNOSIS — N183 Chronic kidney disease, stage 3 unspecified: Secondary | ICD-10-CM | POA: Diagnosis not present

## 2019-08-20 DIAGNOSIS — Z0000 Encounter for general adult medical examination without abnormal findings: Secondary | ICD-10-CM | POA: Diagnosis not present

## 2019-08-20 DIAGNOSIS — L570 Actinic keratosis: Secondary | ICD-10-CM | POA: Diagnosis not present

## 2019-08-20 DIAGNOSIS — Z85819 Personal history of malignant neoplasm of unspecified site of lip, oral cavity, and pharynx: Secondary | ICD-10-CM | POA: Diagnosis not present

## 2019-08-20 DIAGNOSIS — I480 Paroxysmal atrial fibrillation: Secondary | ICD-10-CM | POA: Diagnosis not present

## 2019-08-27 ENCOUNTER — Ambulatory Visit: Payer: Medicare Other | Attending: Internal Medicine

## 2019-08-27 DIAGNOSIS — Z23 Encounter for immunization: Secondary | ICD-10-CM | POA: Insufficient documentation

## 2019-08-27 NOTE — Progress Notes (Signed)
   Covid-19 Vaccination Clinic  Name:  [name]    MRN: [id number] DOB: 1951/04/07  08/27/2019  [name] was observed post Covid-19 immunization for 15 minutes without incident. He was provided with Vaccine Information Sheet and instruction to access the V-Safe system.   [name] was instructed to call 911 with any severe reactions post vaccine: Marland Kitchen Difficulty breathing  . Swelling of face and throat  . A fast heartbeat  . A bad rash all over body  . Dizziness and weakness   Immunizations Administered    Name Date Dose VIS Date Route   Pfizer COVID-19 Vaccine 08/27/2019 12:28 PM 0.3 mL 06/06/2019 Intramuscular   Manufacturer: [place]   Lot: [id number]   [place]: [id number]

## 2019-08-28 ENCOUNTER — Telehealth: Payer: Self-pay

## 2019-08-28 ENCOUNTER — Ambulatory Visit (INDEPENDENT_AMBULATORY_CARE_PROVIDER_SITE_OTHER): Payer: Medicare Other | Admitting: *Deleted

## 2019-08-28 DIAGNOSIS — I480 Paroxysmal atrial fibrillation: Secondary | ICD-10-CM | POA: Diagnosis not present

## 2019-08-28 LAB — CUP PACEART REMOTE DEVICE CHECK
Date Time Interrogation Session: 20210304025208
Implantable Pulse Generator Implant Date: 20190924

## 2019-08-28 NOTE — Progress Notes (Signed)
ILR Remote 

## 2019-09-01 ENCOUNTER — Telehealth: Payer: Self-pay

## 2019-09-01 ENCOUNTER — Telehealth (INDEPENDENT_AMBULATORY_CARE_PROVIDER_SITE_OTHER): Payer: Medicare Other | Admitting: Internal Medicine

## 2019-09-01 ENCOUNTER — Encounter: Payer: Self-pay | Admitting: Internal Medicine

## 2019-09-01 VITALS — BP 151/86 | HR 68 | Ht 76.0 in | Wt 250.0 lb

## 2019-09-01 DIAGNOSIS — I10 Essential (primary) hypertension: Secondary | ICD-10-CM

## 2019-09-01 DIAGNOSIS — R0602 Shortness of breath: Secondary | ICD-10-CM

## 2019-09-01 DIAGNOSIS — I4819 Other persistent atrial fibrillation: Secondary | ICD-10-CM

## 2019-09-01 DIAGNOSIS — E039 Hypothyroidism, unspecified: Secondary | ICD-10-CM | POA: Diagnosis not present

## 2019-09-01 DIAGNOSIS — G4733 Obstructive sleep apnea (adult) (pediatric): Secondary | ICD-10-CM

## 2019-09-01 DIAGNOSIS — D6869 Other thrombophilia: Secondary | ICD-10-CM

## 2019-09-01 DIAGNOSIS — Z6830 Body mass index (BMI) 30.0-30.9, adult: Secondary | ICD-10-CM

## 2019-09-01 DIAGNOSIS — I2510 Atherosclerotic heart disease of native coronary artery without angina pectoris: Secondary | ICD-10-CM

## 2019-09-01 NOTE — Telephone Encounter (Signed)
All orders entered.  All follow up visits scheduled.  Pt instruction sent via mychart.

## 2019-09-01 NOTE — Progress Notes (Signed)
Electrophysiology TeleHealth Note   Due to national recommendations of social distancing due to COVID 19, an audio/video telehealth visit is felt to be most appropriate for this patient at this time.  See MyChart message from today for the patient's consent to telehealth for [facility].  Date:  09/01/2019   ID:  [name], DOB Sep 29, 1950, MRN [id number]  Location: patient's home  Provider location:  [facility]  Evaluation Performed: Follow-up visit  PCP:  [name], MD   Electrophysiologist:  Dr [name]  Chief Complaint:  palpitations  History of Present Illness:    [name] is a 69 y.o. male who presents via telehealth conferencing today.  Since last being seen in our clinic, the patient reports doing reasonably well. He has had some ERAF post ablation.  He has SOB wiith moderate activity.  His exercise tolerance remains reduced  He feels that his chronic edema is also a little worse.  He uses hctz for fluid control and has CRI with baseline creatinine of 2.  Today, he denies symptoms of palpitations, chest pain, dizziness, presyncope, or syncope.  The patient is otherwise without complaint today.   Past Medical History:  Diagnosis Date  . CAD (coronary artery disease)    70% LCx stenosis, treated medically  . Diabetes mellitus   . Encounter for therapeutic drug monitoring 07/22/2013  . Essential hypertension 03/10/2014  . GERD (gastroesophageal reflux disease)   . Hyperlipidemia 03/10/2014  . Hypertension   . Hypotension   . Hypothyroidism 03/10/2014  . Left atrial enlargement   . Obstructive sleep apnea 03/10/2014  . On Coumadin for atrial fibrillation ([place]) 04/02/2013  . Persistent atrial fibrillation ([place])   . 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 Valere Dross and [facility]   . Sleep apnea   . 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 Uncomplicated type 2 diabetes mellitus ([place])  03/10/2014    Past Surgical History:  Procedure Laterality Date  . ATRIAL FIBRILLATION ABLATION N/A 06/06/2019   Procedure: ATRIAL FIBRILLATION ABLATION;  Surgeon: Thompson Grayer, MD;  Location: [place] [facility];  Service: Cardiovascular;  Laterality: N/A;  . BREAST SURGERY    . CARDIAC CATHETERIZATION    . CARDIOVERSION N/A 10/14/2015   Procedure: CARDIOVERSION;  Surgeon: Lelon Perla, MD;  Location: [facility] ENDOSCOPY;  Service: Cardiovascular;  Laterality: N/A;  . COLONOSCOPY WITH PROPOFOL N/A 06/03/2015   Procedure: COLONOSCOPY WITH PROPOFOL;  Surgeon: Laurence Spates, MD;  Location: [facility] ENDOSCOPY;  Service: Endoscopy;  Laterality: N/A;  . COLONOSCOPY WITH PROPOFOL N/A 06/12/2018   Procedure: COLONOSCOPY WITH PROPOFOL;  Surgeon: Laurence Spates, MD;  Location: [facility] ENDOSCOPY;  Service: Endoscopy;  Laterality: N/A;  . HOT HEMOSTASIS N/A 06/03/2015   Procedure: HOT HEMOSTASIS (ARGON PLASMA COAGULATION/BICAP);  Surgeon: Laurence Spates, MD;  Location: [facility];  Service: Endoscopy;  Laterality: N/A;  . LOOP RECORDER INSERTION N/A 03/19/2018   Procedure: LOOP RECORDER INSERTION;  Surgeon: Thompson Grayer, MD;  Location: [place] CV [facility];  Service: Cardiovascular;  Laterality: N/A;    Current Outpatient Medications  Medication Sig Dispense Refill  . acetaminophen (TYLENOL) 500 MG tablet Take 500 mg by mouth 2 (two) times daily.    Marland Kitchen amiodarone (PACERONE) 200 MG tablet Take 1 tablet (200 mg total) by mouth daily. 30 tablet 6  . amLODipine (NORVASC) 5 MG tablet Take 2 tablets (10 mg total) by mouth daily. 180 tablet 3  .  desonide (DESOWEN) 0.05 % cream Apply 1 application topically 2 (two) times daily as needed (dry skin). Behind ears.     . doxazosin (CARDURA) 4 MG tablet Take 4 mg by mouth at bedtime.     . hydrochlorothiazide (HYDRODIURIL) 25 MG tablet Take 25 mg by mouth daily.    Marland Kitchen levothyroxine (SYNTHROID) 112 MCG tablet Take 112 mcg by mouth daily.    Marland Kitchen liothyronine (CYTOMEL) 5 MCG tablet  Take 2.5 mcg by mouth daily before breakfast.     . loratadine (CLARITIN) 10 MG tablet Take 10 mg by mouth daily.     . pravastatin (PRAVACHOL) 80 MG tablet Take 80 mg by mouth at bedtime.     . quinapril (ACCUPRIL) 20 MG tablet Take 1 tablet (20 mg total) by mouth 2 (two) times a day. 270 tablet 3  . rivaroxaban (XARELTO) 20 MG TABS tablet Take 1 tablet (20 mg total) by mouth daily with supper. 30 tablet 5   No current facility-administered medications for this visit.    Allergies:   Nsaids   Social History:  The patient  reports that he has never smoked. He has never used smokeless tobacco. He reports that he does not drink alcohol or use drugs.   Family History:  The patient's family history includes Breast cancer in his mother; Coronary artery disease in his father.   ROS:  Please see the history of present illness.   All other systems are personally reviewed and negative.    Exam:    Vital Signs:  BP (!) 151/86   Pulse 68   Ht 6\' 4"  (1.93 m)   Wt 250 lb (113.4 kg)   BMI 30.43 kg/m   Well sounding and appearing, alert and conversant, regular work of breathing,  good skin color Eyes- anicteric, neuro- grossly intact, skin- no apparent rash or lesions or cyanosis, mouth- oral mucosa is pink  Labs/Other Tests and Data Reviewed:    Recent Labs: 09/04/2018: Magnesium 1.8 06/03/2019: Hemoglobin 13.0; Platelets 173 07/08/2019: ALT 25; BUN 22; Creatinine, Ser 2.00; Potassium 4.2; Sodium 138; TSH 4.601   Wt Readings from Last 3 Encounters:  09/01/19 250 lb (113.4 kg)  07/08/19 255 lb 6.4 oz (115.8 kg)  06/06/19 250 lb (113.4 kg)     Last device remote is reviewed from [place] PDF which reveals normal device function, no arrhythmias    ASSESSMENT & PLAN:    1.  Persistent afib Doing very well post ablation, without recurrence by ILR (personally reviewed) Stop amiodarone at this time Continue to follow with ILR chads2vasc score is 4.  Continue xarelto  2. SOB  Unclear  etiology Stop amiodarone We will obtain PFTs and an echo I suspect some degree of diastolic dysfunction as well as renal failure are responsible. May benefit from gentle diuresis pending these results He has baseline creatinine of 2 and uses hctz for fluid management currently. I will reach out to Dr [name] for follow-up with his team. Sodium restriction encouraged  3. HTN Stable No change required today  4. OSA Compliance with CPAP is encouraged  5. obesity Body mass index is 30.43 kg/m. lifesyle modification is encouraged  6. CAD May consider RHC/LHC if symptoms of SOB persist.  Given chronic renal failure, it would be nice to avoid this if possible  Follow-up:  With Dr [name] in 6 weeks I will see iin 3 months   Patient Risk:  after full review of this patients clinical status, I feel that they are  at moderate risk at this time.  Today, I have spent 15 minutes with the patient with telehealth technology discussing arrhythmia management .    Army Fossa, MD  09/01/2019 9:17 AM     [facility] [address] [place] [address] [phone] (office) [phone] (fax)

## 2019-09-01 NOTE — Telephone Encounter (Signed)
-----   Message from Thompson Grayer, MD sent at 09/01/2019  9:23 AM EST ----- Jeralene Peters,   Mr Brey is having more SOB.  His loop recorder shows that he is in sinus.  No afib post ablation. I will order an echo and PFTs, stop amio.  If his symptoms persist, it may be worth RHC/LHC at some point.  He has chronic renal failure, so hopefully we can manage with a more conservative approach.   I would appreciate your help.    He saw you in November.  At that time, you had planned to see him back in 6 months.  That should time out to about 6 weeks from now and should be perfect.   Sonia Baller, Stop amiodarone Echo to evaluate SOB PFTs to evaluate SOB   Follow-up with Dr [name] in 6 weeks I will see in 3 months

## 2019-09-04 DIAGNOSIS — Z85819 Personal history of malignant neoplasm of unspecified site of lip, oral cavity, and pharynx: Secondary | ICD-10-CM | POA: Diagnosis not present

## 2019-09-04 DIAGNOSIS — Z8669 Personal history of other diseases of the nervous system and sense organs: Secondary | ICD-10-CM | POA: Diagnosis not present

## 2019-09-04 DIAGNOSIS — R6889 Other general symptoms and signs: Secondary | ICD-10-CM | POA: Diagnosis not present

## 2019-09-04 DIAGNOSIS — E039 Hypothyroidism, unspecified: Secondary | ICD-10-CM | POA: Diagnosis not present

## 2019-09-04 DIAGNOSIS — Z7189 Other specified counseling: Secondary | ICD-10-CM | POA: Diagnosis not present

## 2019-09-04 DIAGNOSIS — I4891 Unspecified atrial fibrillation: Secondary | ICD-10-CM | POA: Diagnosis not present

## 2019-09-04 DIAGNOSIS — I10 Essential (primary) hypertension: Secondary | ICD-10-CM | POA: Diagnosis not present

## 2019-09-04 DIAGNOSIS — Z6831 Body mass index (BMI) 31.0-31.9, adult: Secondary | ICD-10-CM | POA: Diagnosis not present

## 2019-09-15 ENCOUNTER — Ambulatory Visit (HOSPITAL_COMMUNITY): Payer: Medicare Other | Attending: Cardiovascular Disease

## 2019-09-15 ENCOUNTER — Other Ambulatory Visit: Payer: Self-pay

## 2019-09-15 DIAGNOSIS — R0602 Shortness of breath: Secondary | ICD-10-CM | POA: Diagnosis not present

## 2019-09-19 DIAGNOSIS — N183 Chronic kidney disease, stage 3 unspecified: Secondary | ICD-10-CM | POA: Diagnosis not present

## 2019-09-19 DIAGNOSIS — N189 Chronic kidney disease, unspecified: Secondary | ICD-10-CM | POA: Diagnosis not present

## 2019-09-19 DIAGNOSIS — E039 Hypothyroidism, unspecified: Secondary | ICD-10-CM | POA: Diagnosis not present

## 2019-09-19 DIAGNOSIS — E1122 Type 2 diabetes mellitus with diabetic chronic kidney disease: Secondary | ICD-10-CM | POA: Diagnosis not present

## 2019-09-19 DIAGNOSIS — E118 Type 2 diabetes mellitus with unspecified complications: Secondary | ICD-10-CM | POA: Diagnosis not present

## 2019-09-19 DIAGNOSIS — I25119 Atherosclerotic heart disease of native coronary artery with unspecified angina pectoris: Secondary | ICD-10-CM | POA: Diagnosis not present

## 2019-09-19 DIAGNOSIS — I4891 Unspecified atrial fibrillation: Secondary | ICD-10-CM | POA: Diagnosis not present

## 2019-09-19 DIAGNOSIS — E782 Mixed hyperlipidemia: Secondary | ICD-10-CM | POA: Diagnosis not present

## 2019-09-19 DIAGNOSIS — I480 Paroxysmal atrial fibrillation: Secondary | ICD-10-CM | POA: Diagnosis not present

## 2019-09-19 DIAGNOSIS — E7800 Pure hypercholesterolemia, unspecified: Secondary | ICD-10-CM | POA: Diagnosis not present

## 2019-09-19 DIAGNOSIS — I10 Essential (primary) hypertension: Secondary | ICD-10-CM | POA: Diagnosis not present

## 2019-09-29 ENCOUNTER — Ambulatory Visit (INDEPENDENT_AMBULATORY_CARE_PROVIDER_SITE_OTHER): Payer: Medicare Other | Admitting: *Deleted

## 2019-09-29 DIAGNOSIS — I480 Paroxysmal atrial fibrillation: Secondary | ICD-10-CM | POA: Diagnosis not present

## 2019-09-29 LAB — CUP PACEART REMOTE DEVICE CHECK
Date Time Interrogation Session: 20210404035407
Implantable Pulse Generator Implant Date: 20190924

## 2019-09-30 ENCOUNTER — Telehealth: Payer: Self-pay | Admitting: Interventional Cardiology

## 2019-09-30 NOTE — Progress Notes (Signed)
ILR Remote 

## 2019-09-30 NOTE — Telephone Encounter (Signed)
New Message    Pt is calling and is wondering if his Echo results can be sent to him through my chart    Please advise

## 2019-09-30 NOTE — Telephone Encounter (Signed)
As of automatic nightly transmission from 09/30/19, no AF episodes noted since 04/24/19.

## 2019-10-01 ENCOUNTER — Encounter: Payer: Self-pay | Admitting: Interventional Cardiology

## 2019-10-01 ENCOUNTER — Other Ambulatory Visit: Payer: Self-pay

## 2019-10-01 ENCOUNTER — Ambulatory Visit (INDEPENDENT_AMBULATORY_CARE_PROVIDER_SITE_OTHER): Payer: Medicare Other | Admitting: Interventional Cardiology

## 2019-10-01 VITALS — BP 116/72 | HR 79 | Ht 76.0 in | Wt 250.2 lb

## 2019-10-01 DIAGNOSIS — I951 Orthostatic hypotension: Secondary | ICD-10-CM | POA: Diagnosis not present

## 2019-10-01 DIAGNOSIS — Z5181 Encounter for therapeutic drug level monitoring: Secondary | ICD-10-CM | POA: Diagnosis not present

## 2019-10-01 DIAGNOSIS — I10 Essential (primary) hypertension: Secondary | ICD-10-CM

## 2019-10-01 DIAGNOSIS — I480 Paroxysmal atrial fibrillation: Secondary | ICD-10-CM | POA: Diagnosis not present

## 2019-10-01 DIAGNOSIS — Z7189 Other specified counseling: Secondary | ICD-10-CM | POA: Diagnosis not present

## 2019-10-01 DIAGNOSIS — R0602 Shortness of breath: Secondary | ICD-10-CM

## 2019-10-01 DIAGNOSIS — G4733 Obstructive sleep apnea (adult) (pediatric): Secondary | ICD-10-CM | POA: Diagnosis not present

## 2019-10-01 MED ORDER — HYDROCHLOROTHIAZIDE 25 MG PO TABS: 12.5000 mg | ORAL_TABLET | Freq: Every day | ORAL | 3 refills | Status: DC

## 2019-10-01 MED ORDER — QUINAPRIL HCL 20 MG PO TABS: 20.0000 mg | ORAL_TABLET | Freq: Every day | ORAL | 3 refills | Status: DC

## 2019-10-01 NOTE — Patient Instructions (Signed)
Medication Instructions:  1) DISCONTINUE Amlodipine 2) DECREASE Hydrochlorothiazide to 12.5mg  once daily 3) DECREASE [name] to just one tablet in the morning, STOP the evening dose.  *If you need a refill on your cardiac medications before your next appointment, please call your pharmacy*   Lab Work: None If you have labs (blood work) drawn today and your tests are completely normal, you will receive your results only by: Marland Kitchen MyChart Message (if you have MyChart) OR . A paper copy in the mail If you have any lab test that is abnormal or we need to change your treatment, we will call you to review the results.   Testing/Procedures: None   Follow-Up: At [facility], you and your health needs are our priority.  As part of our continuing mission to provide you with exceptional heart care, we have created designated Provider Care Teams.  These Care Teams include your primary Cardiologist (physician) and Advanced Practice Providers (APPs -  Physician Assistants and Nurse Practitioners) who all work together to provide you with the care you need, when you need it.  We recommend signing up for the patient portal called "MyChart".  Sign up information is provided on this After Visit Summary.  MyChart is used to connect with patients for Virtual Visits (Telemedicine).  Patients are able to view lab/test results, encounter notes, upcoming appointments, etc.  Non-urgent messages can be sent to your provider as well.   To learn more about what you can do with MyChart, go to [email or web address].    Your next appointment:   3 month(s)  The format for your next appointment:   In Person  Provider:   You may see Sinclair Grooms, MD or one of the following Advanced Practice Providers on your designated Care Team:    Truitt Merle, NP  Cecilie Kicks, NP  Kathyrn Drown, NP    Other Instructions  After 5-7 days of the medication changes, send [place] a MyChart message and let us know how  you are doing.

## 2019-10-01 NOTE — Telephone Encounter (Signed)
Pasted echo results into mychart for patient.
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
## 2019-10-15 DIAGNOSIS — E039 Hypothyroidism, unspecified: Secondary | ICD-10-CM | POA: Diagnosis not present

## 2019-10-15 DIAGNOSIS — I4891 Unspecified atrial fibrillation: Secondary | ICD-10-CM | POA: Diagnosis not present

## 2019-10-15 DIAGNOSIS — I480 Paroxysmal atrial fibrillation: Secondary | ICD-10-CM | POA: Diagnosis not present

## 2019-10-15 DIAGNOSIS — E1122 Type 2 diabetes mellitus with diabetic chronic kidney disease: Secondary | ICD-10-CM | POA: Diagnosis not present

## 2019-10-15 DIAGNOSIS — I25119 Atherosclerotic heart disease of native coronary artery with unspecified angina pectoris: Secondary | ICD-10-CM | POA: Diagnosis not present

## 2019-10-15 DIAGNOSIS — N183 Chronic kidney disease, stage 3 unspecified: Secondary | ICD-10-CM | POA: Diagnosis not present

## 2019-10-15 DIAGNOSIS — E7800 Pure hypercholesterolemia, unspecified: Secondary | ICD-10-CM | POA: Diagnosis not present

## 2019-10-15 DIAGNOSIS — E782 Mixed hyperlipidemia: Secondary | ICD-10-CM | POA: Diagnosis not present

## 2019-10-15 DIAGNOSIS — I10 Essential (primary) hypertension: Secondary | ICD-10-CM | POA: Diagnosis not present

## 2019-10-15 DIAGNOSIS — E118 Type 2 diabetes mellitus with unspecified complications: Secondary | ICD-10-CM | POA: Diagnosis not present

## 2019-10-15 DIAGNOSIS — N189 Chronic kidney disease, unspecified: Secondary | ICD-10-CM | POA: Diagnosis not present
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
## 2019-10-21 ENCOUNTER — Other Ambulatory Visit: Payer: Self-pay

## 2019-10-21 ENCOUNTER — Encounter: Payer: Self-pay | Admitting: Interventional Cardiology

## 2019-10-21 ENCOUNTER — Telehealth: Payer: Self-pay | Admitting: Interventional Cardiology

## 2019-10-21 ENCOUNTER — Ambulatory Visit (INDEPENDENT_AMBULATORY_CARE_PROVIDER_SITE_OTHER): Payer: Medicare Other | Admitting: Interventional Cardiology

## 2019-10-21 VITALS — BP 128/76 | HR 73 | Ht 76.0 in | Wt 251.0 lb

## 2019-10-21 DIAGNOSIS — I4891 Unspecified atrial fibrillation: Secondary | ICD-10-CM | POA: Diagnosis not present

## 2019-10-21 DIAGNOSIS — I10 Essential (primary) hypertension: Secondary | ICD-10-CM

## 2019-10-21 DIAGNOSIS — G4733 Obstructive sleep apnea (adult) (pediatric): Secondary | ICD-10-CM

## 2019-10-21 DIAGNOSIS — Z7901 Long term (current) use of anticoagulants: Secondary | ICD-10-CM

## 2019-10-21 DIAGNOSIS — I480 Paroxysmal atrial fibrillation: Secondary | ICD-10-CM | POA: Diagnosis not present

## 2019-10-21 DIAGNOSIS — E119 Type 2 diabetes mellitus without complications: Secondary | ICD-10-CM

## 2019-10-21 DIAGNOSIS — Z7189 Other specified counseling: Secondary | ICD-10-CM

## 2019-10-21 MED ORDER — DILTIAZEM HCL ER COATED BEADS 120 MG PO CP24: 120.0000 mg | ORAL_CAPSULE | Freq: Every day | ORAL | 3 refills | Status: DC

## 2019-10-21 MED ORDER — VERAPAMIL HCL ER 120 MG PO TBCR: 120.0000 mg | EXTENDED_RELEASE_TABLET | Freq: Every day | ORAL | 3 refills | Status: DC

## 2019-10-21 NOTE — Telephone Encounter (Signed)
   Pt c/o medication issue:  1. Name of Medication: diltiazem (CARDIZEM CD) 120 MG 24 hr capsule  2. How are you currently taking this medication (dosage and times per day)? Take 1 capsule (120 mg total) by mouth daily.  3. Are you having a reaction (difficulty breathing--STAT)?   4. What is your medication issue? Pt called, he said he can't swallow capsule form. He wanted to know if he can break it and take it.   Please advise

## 2019-10-21 NOTE — Patient Instructions (Signed)
Medication Instructions:  1) START Diltiazem 120mg  once daily  *If you need a refill on your cardiac medications before your next appointment, please call your pharmacy*   Lab Work: None If you have labs (blood work) drawn today and your tests are completely normal, you will receive your results only by: Marland Kitchen MyChart Message (if you have MyChart) OR . A paper copy in the mail If you have any lab test that is abnormal or we need to change your treatment, we will call you to review the results.   Testing/Procedures: None   Follow-Up: At [facility], you and your health needs are our priority.  As part of our continuing mission to provide you with exceptional heart care, we have created designated Provider Care Teams.  These Care Teams include your primary Cardiologist (physician) and Advanced Practice Providers (APPs -  Physician Assistants and Nurse Practitioners) who all work together to provide you with the care you need, when you need it.  We recommend signing up for the patient portal called "MyChart".  Sign up information is provided on this After Visit Summary.  MyChart is used to connect with patients for Virtual Visits (Telemedicine).  Patients are able to view lab/test results, encounter notes, upcoming appointments, etc.  Non-urgent messages can be sent to your provider as well.   To learn more about what you can do with MyChart, go to [email or web address].    Your next appointment:   6 month(s)  The format for your next appointment:   In Person  Provider:   You may see Sinclair Grooms, MD or one of the following Advanced Practice Providers on your designated Care Team:    Truitt Merle, NP  Cecilie Kicks, NP  Kathyrn Drown, NP    Other Instructions  Your physician recommends that you only check your blood pressure 2-3 times per week.  Make sure it is at least 2 hours after your medication before checking.

## 2019-10-21 NOTE — Telephone Encounter (Addendum)
Spoke with Dr. [name] and he said ok to send in Verapamil CR 120mg  once daily since pt is unable to swallow capsules.  Spoke with pt about recommendations.  Pt agreeable to plan.

## 2019-10-27 ENCOUNTER — Other Ambulatory Visit (HOSPITAL_COMMUNITY)
Admission: RE | Admit: 2019-10-27 | Discharge: 2019-10-27 | Disposition: A | Discharge status: Home / Self Care | Payer: Medicare Other | Source: Ambulatory Visit | Attending: Internal Medicine | Admitting: Internal Medicine

## 2019-10-27 DIAGNOSIS — Z20822 Contact with and (suspected) exposure to covid-19: Secondary | ICD-10-CM | POA: Insufficient documentation

## 2019-10-27 DIAGNOSIS — E559 Vitamin D deficiency, unspecified: Secondary | ICD-10-CM | POA: Diagnosis not present

## 2019-10-27 DIAGNOSIS — Z01812 Encounter for preprocedural laboratory examination: Secondary | ICD-10-CM | POA: Diagnosis not present

## 2019-10-27 DIAGNOSIS — E039 Hypothyroidism, unspecified: Secondary | ICD-10-CM | POA: Diagnosis not present

## 2019-10-27 LAB — SARS CORONAVIRUS 2 (TAT 6-24 HRS): SARS Coronavirus 2: NEGATIVE

## 2019-10-30 ENCOUNTER — Ambulatory Visit (INDEPENDENT_AMBULATORY_CARE_PROVIDER_SITE_OTHER): Payer: Medicare Other | Admitting: Internal Medicine

## 2019-10-30 ENCOUNTER — Other Ambulatory Visit: Payer: Self-pay

## 2019-10-30 DIAGNOSIS — R0602 Shortness of breath: Secondary | ICD-10-CM | POA: Diagnosis not present

## 2019-10-30 LAB — PULMONARY FUNCTION TEST
DL/VA % pred: 94 %
DL/VA: 3.76 ml/min/mmHg/L
DLCO cor % pred: 74 %
DLCO cor: 23.38 ml/min/mmHg
DLCO unc % pred: 74 %
DLCO unc: 23.38 ml/min/mmHg
FEF 25-75 Post: 3.6 L/sec
FEF 25-75 Pre: 3.87 L/sec
FEF2575-%Change-Post: -6 %
FEF2575-%Pred-Post: 114 %
FEF2575-%Pred-Pre: 122 %
FEV1-%Change-Post: -3 %
FEV1-%Pred-Post: 73 %
FEV1-%Pred-Pre: 76 %
FEV1-Post: 3.04 L
FEV1-Pre: 3.16 L
FEV1FVC-%Change-Post: -1 %
FEV1FVC-%Pred-Pre: 113 %
FEV6-%Change-Post: -2 %
FEV6-%Pred-Post: 68 %
FEV6-%Pred-Pre: 70 %
FEV6-Post: 3.64 L
FEV6-Pre: 3.72 L
FEV6FVC-%Change-Post: 0 %
FEV6FVC-%Pred-Post: 104 %
FEV6FVC-%Pred-Pre: 104 %
FVC-%Change-Post: -2 %
FVC-%Pred-Post: 66 %
FVC-%Pred-Pre: 67 %
FVC-Post: 3.67 L
FVC-Pre: 3.75 L
Post FEV1/FVC ratio: 83 %
Post FEV6/FVC ratio: 99 %
Pre FEV1/FVC ratio: 84 %
Pre FEV6/FVC Ratio: 99 %

## 2019-10-30 LAB — CUP PACEART REMOTE DEVICE CHECK
Date Time Interrogation Session: 20210505035821
Implantable Pulse Generator Implant Date: 20190924

## 2019-10-30 NOTE — Progress Notes (Signed)
PFT done today. 

## 2019-11-03 ENCOUNTER — Ambulatory Visit (INDEPENDENT_AMBULATORY_CARE_PROVIDER_SITE_OTHER): Payer: Medicare Other | Admitting: *Deleted

## 2019-11-03 ENCOUNTER — Other Ambulatory Visit: Payer: Self-pay | Admitting: Internal Medicine

## 2019-11-03 DIAGNOSIS — I4819 Other persistent atrial fibrillation: Secondary | ICD-10-CM

## 2019-11-04 DIAGNOSIS — Z6831 Body mass index (BMI) 31.0-31.9, adult: Secondary | ICD-10-CM | POA: Diagnosis not present

## 2019-11-04 DIAGNOSIS — Z7189 Other specified counseling: Secondary | ICD-10-CM | POA: Diagnosis not present

## 2019-11-04 DIAGNOSIS — R6889 Other general symptoms and signs: Secondary | ICD-10-CM | POA: Diagnosis not present

## 2019-11-04 DIAGNOSIS — I10 Essential (primary) hypertension: Secondary | ICD-10-CM | POA: Diagnosis not present

## 2019-11-04 DIAGNOSIS — I4891 Unspecified atrial fibrillation: Secondary | ICD-10-CM | POA: Diagnosis not present

## 2019-11-04 DIAGNOSIS — E559 Vitamin D deficiency, unspecified: Secondary | ICD-10-CM | POA: Diagnosis not present

## 2019-11-04 DIAGNOSIS — Z85819 Personal history of malignant neoplasm of unspecified site of lip, oral cavity, and pharynx: Secondary | ICD-10-CM | POA: Diagnosis not present

## 2019-11-04 DIAGNOSIS — Z8669 Personal history of other diseases of the nervous system and sense organs: Secondary | ICD-10-CM | POA: Diagnosis not present

## 2019-11-04 DIAGNOSIS — E039 Hypothyroidism, unspecified: Secondary | ICD-10-CM | POA: Diagnosis not present

## 2019-11-04 NOTE — Progress Notes (Signed)
Carelink Summary Report / Loop Recorder 

## 2019-11-04 NOTE — Telephone Encounter (Signed)
Pt last saw Dr [name] 10/21/19, last labs 07/08/19 Creat 2.0, age 69, weight 113.9kg, CrCl 56.95, based on CrCl pt is on appropriate dosage of Xarelto 20mg  QD.  Will refill rx.

## 2019-11-05 ENCOUNTER — Other Ambulatory Visit: Payer: Self-pay

## 2019-11-05 ENCOUNTER — Ambulatory Visit (INDEPENDENT_AMBULATORY_CARE_PROVIDER_SITE_OTHER): Payer: Medicare Other | Admitting: Podiatry

## 2019-11-05 ENCOUNTER — Encounter: Payer: Self-pay | Admitting: Podiatry

## 2019-11-05 VITALS — Temp 96.8°F

## 2019-11-05 DIAGNOSIS — M79674 Pain in right toe(s): Secondary | ICD-10-CM | POA: Diagnosis not present

## 2019-11-05 DIAGNOSIS — B351 Tinea unguium: Secondary | ICD-10-CM

## 2019-11-05 DIAGNOSIS — M79675 Pain in left toe(s): Secondary | ICD-10-CM | POA: Diagnosis not present

## 2019-11-05 DIAGNOSIS — D689 Coagulation defect, unspecified: Secondary | ICD-10-CM

## 2019-11-05 DIAGNOSIS — E1142 Type 2 diabetes mellitus with diabetic polyneuropathy: Secondary | ICD-10-CM | POA: Diagnosis not present

## 2019-11-05 NOTE — Progress Notes (Signed)
This patient returns to my office for at risk foot care.  This patient requires this care by a professional since this patient will be at risk due to having diabetes mellitus and coagulation defect.  Patient is taking xarelto.  This  patient is unable to cut nails himself since the patient cannot reach his nails.These nails are painful walking and wearing shoes.  This patient presents for at risk foot care today.  General Appearance  Alert, conversant and in no acute stress.  Vascular  Dorsalis pedis and posterior tibial  pulses are palpable  bilaterally.  Capillary return is within normal limits  bilaterally. Temperature is within normal limits  bilaterally.  Neurologic  Senn-Weinstein monofilament wire test within normal limits  bilaterally. Muscle power within normal limits bilaterally.  Nails Thick disfigured discolored nails with subungual debris  from hallux to fifth toes bilaterally. No evidence of bacterial infection or drainage bilaterally.  Orthopedic  No limitations of motion  feet .  No crepitus or effusions noted.  No bony pathology or digital deformities noted. Hallux malleus right.  HAV  B/L.  Hammer toes 2-5  B/l.  Skin  normotropic skin with no porokeratosis noted bilaterally.  No signs of infections or ulcers noted.     Onychomycosis  Pain in right toes  Pain in left toes  Consent was obtained for treatment procedures.   Mechanical debridement of nails 1-5  bilaterally performed with a nail nipper.  Filed with dremel without incident.    Return office visit     3 months                Told patient to return for periodic foot care and evaluation due to potential at risk complications.   Kieon Lawhorn DPM  

## 2019-11-10 ENCOUNTER — Telehealth: Payer: Self-pay | Admitting: Nurse Practitioner

## 2019-11-10 DIAGNOSIS — G4733 Obstructive sleep apnea (adult) (pediatric): Secondary | ICD-10-CM

## 2019-11-10 DIAGNOSIS — J849 Interstitial pulmonary disease, unspecified: Secondary | ICD-10-CM

## 2019-11-10 NOTE — Telephone Encounter (Signed)
Called patient to review PFT results. He agrees with plan for referral to pulmonology and does not report a preference regarding a provider. He states he has not been wearing his CPAP because it gives him a headache. He was treated for sleep apnea by Dr. Oletta Lamas with [facility] but has not seen him in a while. I advised that I will place referral to [place] and he should receive a call soon to schedule. Patient reports that he has been having low BP readings of 80/50 when he goes from sitting to standing. He states he needs assistance ambulating into stores and at home at times and cannot complete his ADLs. States this was occurring when he saw Dr. [name] on 4/27. I reviewed medication list and he confirmed cardiac meds. I advised that I do not see any communication in his EMR reporting low BP readings. I advised that I will forward message to both Drs. Smith and Allred for review because he has a telehealth visit with Dr. [name] on 5/24 but verapamil was started by Dr. [name] on 4/27. I advised patient our office will call back with any advice and he thanked me for the call.

## 2019-11-10 NOTE — Telephone Encounter (Signed)
-----   Message from Thompson Grayer, MD sent at 11/09/2019  3:32 PM EDT ----- Results reviewed.  Sonia Baller, please inform pt of result. I will route to Dr [name] also. Sonia Baller, please refer to pulmonary for further evaluation.

## 2019-11-11 NOTE — Telephone Encounter (Signed)
Stop Verapamil.

## 2019-11-11 NOTE — Telephone Encounter (Signed)
Called patient and advised him to stop verapamil.  Advised patient to continue to monitor BP and HR and to call back prior to appointment if he has questions or concerns. He verbalized understanding and agreement with plan and thanked me for the call.

## 2019-11-17 ENCOUNTER — Encounter: Payer: Self-pay | Admitting: Internal Medicine

## 2019-11-17 ENCOUNTER — Telehealth (INDEPENDENT_AMBULATORY_CARE_PROVIDER_SITE_OTHER): Payer: Medicare Other | Admitting: Internal Medicine

## 2019-11-17 ENCOUNTER — Other Ambulatory Visit: Payer: Self-pay

## 2019-11-17 VITALS — BP 149/85 | HR 58 | Ht 76.0 in | Wt 250.0 lb

## 2019-11-17 DIAGNOSIS — J849 Interstitial pulmonary disease, unspecified: Secondary | ICD-10-CM

## 2019-11-17 DIAGNOSIS — I951 Orthostatic hypotension: Secondary | ICD-10-CM

## 2019-11-17 DIAGNOSIS — I119 Hypertensive heart disease without heart failure: Secondary | ICD-10-CM | POA: Diagnosis not present

## 2019-11-17 DIAGNOSIS — I4819 Other persistent atrial fibrillation: Secondary | ICD-10-CM

## 2019-11-17 DIAGNOSIS — D6869 Other thrombophilia: Secondary | ICD-10-CM | POA: Diagnosis not present

## 2019-11-17 DIAGNOSIS — G4733 Obstructive sleep apnea (adult) (pediatric): Secondary | ICD-10-CM

## 2019-11-17 NOTE — Progress Notes (Signed)
Electrophysiology TeleHealth Note   Due to national recommendations of social distancing due to COVID 19, an audio/video telehealth visit is felt to be most appropriate for this patient at this time.  See MyChart message from today for the patient's consent to telehealth for [facility].  Date:  11/17/2019   ID:  [name], DOB 1951/06/17, MRN [id number]  Location: patient's home  Provider location:  [place]  Evaluation Performed: Follow-up visit  PCP:  [name], MD   Electrophysiologist:  Dr [name]  Chief Complaint:  palpitations  History of Present Illness:    CID BILLICK is a 69 y.o. male who presents via telehealth conferencing today.  Since last being seen in our clinic, the patient reports doing reasonably well.  afib has been well controlled.  He is having no swelling.  He is very concerned about labile BP.  He has frequent postural hypotension.  He is checking his BP frequently.  His SOB is stable.   Today, he denies symptoms of palpitations, chest pain, presyncope, or syncope.  The patient is otherwise without complaint today.   Past Medical History:  Diagnosis Date  . CAD (coronary artery disease)    70% LCx stenosis, treated medically  . Diabetes mellitus   . Encounter for therapeutic drug monitoring 07/22/2013  . Essential hypertension 03/10/2014  . GERD (gastroesophageal reflux disease)   . Hyperlipidemia 03/10/2014  . Hypertension   . Hypotension   . Hypothyroidism 03/10/2014  . Left atrial enlargement   . Obstructive sleep apnea 03/10/2014  . On Coumadin for atrial fibrillation ([place]) 04/02/2013  . Persistent atrial fibrillation ([place])   . 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 Valere Dross and [facility]   . Sleep apnea   . 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 Uncomplicated type 2 diabetes mellitus ([place]) 03/10/2014    Past Surgical History:  Procedure  Laterality Date  . ATRIAL FIBRILLATION ABLATION N/A 06/06/2019   Procedure: ATRIAL FIBRILLATION ABLATION;  Surgeon: Thompson Grayer, MD;  Location: [facility];  Service: Cardiovascular;  Laterality: N/A;  . BREAST SURGERY    . CARDIAC CATHETERIZATION    . CARDIOVERSION N/A 10/14/2015   Procedure: CARDIOVERSION;  Surgeon: Lelon Perla, MD;  Location: [facility] ENDOSCOPY;  Service: Cardiovascular;  Laterality: N/A;  . COLONOSCOPY WITH PROPOFOL N/A 06/03/2015   Procedure: COLONOSCOPY WITH PROPOFOL;  Surgeon: Laurence Spates, MD;  Location: [facility];  Service: Endoscopy;  Laterality: N/A;  . COLONOSCOPY WITH PROPOFOL N/A 06/12/2018   Procedure: COLONOSCOPY WITH PROPOFOL;  Surgeon: Laurence Spates, MD;  Location: [facility];  Service: Endoscopy;  Laterality: N/A;  . HOT HEMOSTASIS N/A 06/03/2015   Procedure: HOT HEMOSTASIS (ARGON PLASMA COAGULATION/BICAP);  Surgeon: Laurence Spates, MD;  Location: [facility];  Service: Endoscopy;  Laterality: N/A;  . LOOP RECORDER INSERTION N/A 03/19/2018   Procedure: LOOP RECORDER INSERTION;  Surgeon: Thompson Grayer, MD;  Location: [facility];  Service: Cardiovascular;  Laterality: N/A;    Current Outpatient Medications  Medication Sig Dispense Refill  . acetaminophen (TYLENOL) 500 MG tablet Take 500 mg by mouth 2 (two) times daily.    Marland Kitchen desonide (DESOWEN) 0.05 % cream Apply 1 application topically 2 (two) times daily as needed (dry skin). Behind ears.     . doxazosin (CARDURA) 4 MG tablet Take 4 mg by mouth at bedtime.     . hydrochlorothiazide (HYDRODIURIL) 25 MG tablet  Take 0.5 tablets (12.5 mg total) by mouth daily. 45 tablet 3  . levothyroxine (SYNTHROID) 125 MCG tablet Take 125 mcg by mouth daily.    Marland Kitchen loratadine (CLARITIN) 10 MG tablet Take 10 mg by mouth daily.     . pravastatin (PRAVACHOL) 80 MG tablet Take 80 mg by mouth at bedtime.     . quinapril (ACCUPRIL) 20 MG tablet Take 1 tablet (20 mg total) by mouth daily. 270 tablet 3  . XARELTO 20  MG TABS tablet TAKE ONE TABLET BY MOUTH DAILY WITH SUPPER 30 tablet 5   No current facility-administered medications for this visit.    Allergies:   Nsaids   Social History:  The patient  reports that he has never smoked. He has never used smokeless tobacco. He reports that he does not drink alcohol or use drugs.   ROS:  Please see the history of present illness.   All other systems are personally reviewed and negative.    Exam:    Vital Signs:  BP (!) 149/85   Pulse (!) 58   Ht 6\' 4"  (1.93 m)   Wt 250 lb (113.4 kg)   BMI 30.43 kg/m   Well sounding and appearing, alert and conversant, regular work of breathing,  good skin color Eyes- anicteric, neuro- grossly intact, skin- no apparent rash or lesions or cyanosis, mouth- oral mucosa is pink  Labs/Other Tests and Data Reviewed:    Recent Labs: 06/03/2019: Hemoglobin 13.0; Platelets 173 07/08/2019: ALT 25; BUN 22; Creatinine, Ser 2.00; Potassium 4.2; Sodium 138; TSH 4.601   Wt Readings from Last 3 Encounters:  11/17/19 250 lb (113.4 kg)  10/21/19 251 lb (113.9 kg)  10/01/19 250 lb 3.2 oz (113.5 kg)     Last device remote is reviewed from [facility] PDF which reveals normal device function, no arrhythmias   ASSESSMENT & PLAN:    1.  Persistent afib ILR reveals no afib post ablation off AAD therapy chads2vasc score is 4 He is on xarelto  2. Labile BP/ hypertensive cardiovascular disease He is very worried about this Very symptomatic with postural low BP Stop hctz at this time Follow-up with Dr [name] when able.  He may benefit from remote BP monitoring and follow-up in the HTN clinic.  I will defer this decision to Dr [name]  3. SOB He has pulmonary follow-up scheduled for moderately severe interstitial lung diease noted on recent PFTs No longer on amiodarone  4. OSA Compliance with CPAP advised  5. CAD Stable No chnages  Risks, benefits and potential toxicities for medications prescribed and/or refilled reviewed  with patient today.   Follow-up:  Dr [name] at next available time AF clinic in 3 months I will see in 6 months   Patient Risk:  after full review of this patients clinical status, I feel that they are at high risk at this time.  Today, I have spent 15 minutes with the patient with telehealth technology discussing arrhythmia management .    Army Fossa, MD  11/17/2019 10:39 AM     [facility] [address] [place] [address] [phone] (office) [phone] (fax)

## 2019-11-25 ENCOUNTER — Other Ambulatory Visit (HOSPITAL_COMMUNITY): Payer: Medicare Other

## 2019-11-26 DIAGNOSIS — L219 Seborrheic dermatitis, unspecified: Secondary | ICD-10-CM | POA: Diagnosis not present

## 2019-11-26 DIAGNOSIS — L570 Actinic keratosis: Secondary | ICD-10-CM | POA: Diagnosis not present

## 2019-11-28 DIAGNOSIS — E118 Type 2 diabetes mellitus with unspecified complications: Secondary | ICD-10-CM | POA: Diagnosis not present

## 2019-11-28 DIAGNOSIS — I4891 Unspecified atrial fibrillation: Secondary | ICD-10-CM | POA: Diagnosis not present

## 2019-11-28 DIAGNOSIS — E7800 Pure hypercholesterolemia, unspecified: Secondary | ICD-10-CM | POA: Diagnosis not present

## 2019-11-28 DIAGNOSIS — E782 Mixed hyperlipidemia: Secondary | ICD-10-CM | POA: Diagnosis not present

## 2019-11-28 DIAGNOSIS — E039 Hypothyroidism, unspecified: Secondary | ICD-10-CM | POA: Diagnosis not present

## 2019-11-28 DIAGNOSIS — I10 Essential (primary) hypertension: Secondary | ICD-10-CM | POA: Diagnosis not present

## 2019-11-28 DIAGNOSIS — I480 Paroxysmal atrial fibrillation: Secondary | ICD-10-CM | POA: Diagnosis not present

## 2019-11-28 DIAGNOSIS — E1122 Type 2 diabetes mellitus with diabetic chronic kidney disease: Secondary | ICD-10-CM | POA: Diagnosis not present

## 2019-11-28 DIAGNOSIS — I25119 Atherosclerotic heart disease of native coronary artery with unspecified angina pectoris: Secondary | ICD-10-CM | POA: Diagnosis not present

## 2019-11-28 DIAGNOSIS — N189 Chronic kidney disease, unspecified: Secondary | ICD-10-CM | POA: Diagnosis not present

## 2019-11-28 DIAGNOSIS — N183 Chronic kidney disease, stage 3 unspecified: Secondary | ICD-10-CM | POA: Diagnosis not present

## 2019-12-08 ENCOUNTER — Ambulatory Visit (INDEPENDENT_AMBULATORY_CARE_PROVIDER_SITE_OTHER): Payer: Medicare Other | Admitting: *Deleted

## 2019-12-08 DIAGNOSIS — I4819 Other persistent atrial fibrillation: Secondary | ICD-10-CM

## 2019-12-08 LAB — CUP PACEART REMOTE DEVICE CHECK
Date Time Interrogation Session: 20210613235831
Implantable Pulse Generator Implant Date: 20190924

## 2019-12-10 ENCOUNTER — Telehealth: Payer: Medicare Other | Admitting: Internal Medicine

## 2019-12-10 NOTE — Progress Notes (Signed)
Carelink Summary Report / Loop Recorder 

## 2019-12-23 ENCOUNTER — Ambulatory Visit (INDEPENDENT_AMBULATORY_CARE_PROVIDER_SITE_OTHER): Payer: Medicare Other | Admitting: Pulmonary Disease

## 2019-12-23 ENCOUNTER — Encounter: Payer: Self-pay | Admitting: Pulmonary Disease

## 2019-12-23 ENCOUNTER — Other Ambulatory Visit: Payer: Self-pay

## 2019-12-23 DIAGNOSIS — J849 Interstitial pulmonary disease, unspecified: Secondary | ICD-10-CM

## 2019-12-23 DIAGNOSIS — G4733 Obstructive sleep apnea (adult) (pediatric): Secondary | ICD-10-CM
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
## 2019-12-23 NOTE — Assessment & Plan Note (Signed)
Being managed by Dr. Elenore Rota.  We can obtain a CPAP download Association between A. fib and OSA was discussed

## 2019-12-23 NOTE — Assessment & Plan Note (Signed)
CT coronaries from December 2020 indicates ILD with some groundglass and interlobular septal thickening.  This is most likely related to amiodarone.  Confirmed on PFTs by intraparenchymal restriction. However amiodarone was stopped around February.  He does not desaturate on ambulation today indicating that degree of ILD is not severe. We will proceed with high-resolution CT of the chest to look for interim resolution and to see if this fits a particular pattern consistent with amiodarone vs UIP. Otherwise would only observe for now

## 2019-12-23 NOTE — Patient Instructions (Signed)
Ambulatory saturation High res CT chest WO contrast to r/o ILD

## 2019-12-30 ENCOUNTER — Ambulatory Visit
Admission: RE | Admit: 2019-12-30 | Discharge: 2019-12-30 | Disposition: A | Discharge status: Home / Self Care | Payer: Medicare Other | Source: Ambulatory Visit | Attending: Pulmonary Disease | Admitting: Pulmonary Disease

## 2019-12-30 DIAGNOSIS — K8080 Other cholelithiasis without obstruction: Secondary | ICD-10-CM | POA: Diagnosis not present

## 2019-12-30 DIAGNOSIS — I2510 Atherosclerotic heart disease of native coronary artery without angina pectoris: Secondary | ICD-10-CM | POA: Diagnosis not present

## 2019-12-30 DIAGNOSIS — J984 Other disorders of lung: Secondary | ICD-10-CM | POA: Diagnosis not present

## 2019-12-30 DIAGNOSIS — I700 Atherosclerosis of aorta: Secondary | ICD-10-CM | POA: Diagnosis not present

## 2019-12-30 DIAGNOSIS — J849 Interstitial pulmonary disease, unspecified: Secondary | ICD-10-CM

## 2019-12-31 ENCOUNTER — Other Ambulatory Visit: Payer: Medicare Other

## 2020-01-05 ENCOUNTER — Telehealth: Payer: Self-pay | Admitting: Pulmonary Disease

## 2020-01-05 DIAGNOSIS — J849 Interstitial pulmonary disease, unspecified: Secondary | ICD-10-CM

## 2020-01-05 NOTE — Telephone Encounter (Signed)
Called and spoke with Patient. Dr Bari Mantis results and recommendations given.  Understanding stated.  CT chest order placed for 12/2020.  Patient is scheduled to follow up with Dr Elsworth Soho 03/19/20. Nothing further at this time.

## 2020-01-05 NOTE — Telephone Encounter (Signed)
There is bandlike scar tissue noted in both lungs . This may have been related to amiodarone or prior infection/inflammation, difficult to tell Regardless since amiodarone has been stopped, only observation advised for now. CT also showed very tiny nodules in the lung apices , these are probably of no significance but would advise 1 year follow-up CT scan without contrast to see if there is any progression Please arrange for CT and follow-up

## 2020-01-05 NOTE — Telephone Encounter (Signed)
Patient is returning phone call. Patient phone number is [phone].

## 2020-01-05 NOTE — Telephone Encounter (Signed)
Called and spoke with pt letting him know that we have sent request for ct results to [facility] and that once he has reviewed it we will call him back. Pt verbalized understanding. Will await response from Dr. Elsworth Soho.

## 2020-01-05 NOTE — Telephone Encounter (Signed)
LMTCB and in the meantime will forward to Dr Elsworth Soho for the results of ct chest, thanks

## 2020-01-12 ENCOUNTER — Ambulatory Visit (INDEPENDENT_AMBULATORY_CARE_PROVIDER_SITE_OTHER): Payer: Medicare Other | Admitting: *Deleted

## 2020-01-12 DIAGNOSIS — I4819 Other persistent atrial fibrillation: Secondary | ICD-10-CM

## 2020-01-12 LAB — CUP PACEART REMOTE DEVICE CHECK
Date Time Interrogation Session: 20210718232337
Implantable Pulse Generator Implant Date: 20190924

## 2020-01-14 NOTE — Progress Notes (Signed)
Carelink Summary Report / Loop Recorder 

## 2020-01-19 ENCOUNTER — Telehealth: Payer: Self-pay

## 2020-01-19 NOTE — Telephone Encounter (Signed)
Patient is trying to send a transmission to see if everything is okay with his heart. Patient was unclear as to why he wanted to send a transmission and unclear as to why he wanted to make sure his heart is okay patient was more concerned about the monitor not working. Patient  is going to call tech support and call us back to let us know what they say

## 2020-01-20 DIAGNOSIS — N183 Chronic kidney disease, stage 3 unspecified: Secondary | ICD-10-CM | POA: Diagnosis not present

## 2020-01-20 DIAGNOSIS — E782 Mixed hyperlipidemia: Secondary | ICD-10-CM | POA: Diagnosis not present

## 2020-01-20 DIAGNOSIS — E118 Type 2 diabetes mellitus with unspecified complications: Secondary | ICD-10-CM | POA: Diagnosis not present

## 2020-01-20 DIAGNOSIS — I4891 Unspecified atrial fibrillation: Secondary | ICD-10-CM | POA: Diagnosis not present

## 2020-01-20 DIAGNOSIS — E7800 Pure hypercholesterolemia, unspecified: Secondary | ICD-10-CM | POA: Diagnosis not present

## 2020-01-20 DIAGNOSIS — I480 Paroxysmal atrial fibrillation: Secondary | ICD-10-CM | POA: Diagnosis not present

## 2020-01-20 DIAGNOSIS — I10 Essential (primary) hypertension: Secondary | ICD-10-CM | POA: Diagnosis not present

## 2020-01-20 DIAGNOSIS — E1122 Type 2 diabetes mellitus with diabetic chronic kidney disease: Secondary | ICD-10-CM | POA: Diagnosis not present

## 2020-01-20 DIAGNOSIS — E039 Hypothyroidism, unspecified: Secondary | ICD-10-CM | POA: Diagnosis not present

## 2020-01-20 DIAGNOSIS — I25119 Atherosclerotic heart disease of native coronary artery with unspecified angina pectoris: Secondary | ICD-10-CM | POA: Diagnosis not present

## 2020-01-20 DIAGNOSIS — N189 Chronic kidney disease, unspecified: Secondary | ICD-10-CM | POA: Diagnosis not present

## 2020-01-20 NOTE — Telephone Encounter (Signed)
Patient called back as he has not had any success with sending transmission, stated that [employer] told him to call us as it wasw a problem with his ILR.  Conferenced [employer] tech support on line with pt.  After obtaining serial numbers and reviewing prior call, Rep, Rodena Piety ordered pt a new monitor which he should receive by 8/2.  Reviewed last transmission received at [facility] on 7/26, HR was NSR at that time.  No data has been received from pt monitor since.  Advised pt data should still be on ILR and will come through when he gets new monitor.    Pt reports he is feeling good today, denies any cardiac symptoms.  Reports his BP cuff showing regular HR today.  Pt confirmed he is taking meds including Xarelto as ordered. Pt v/u to report to [place] any change in cardiac condition.

## 2020-01-22 ENCOUNTER — Other Ambulatory Visit: Payer: Self-pay | Admitting: *Deleted

## 2020-01-22 MED ORDER — AMLODIPINE BESYLATE 2.5 MG PO TABS: 2.5000 mg | ORAL_TABLET | Freq: Every day | ORAL | 3 refills | Status: DC
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
## 2020-01-22 NOTE — Telephone Encounter (Signed)
Thre numbers are generally on the high side. Difficult to treat because of orthostasis. Add amlodipine 2.5 mg daily and monitor.

## 2020-01-23 ENCOUNTER — Other Ambulatory Visit: Payer: Self-pay

## 2020-01-23 ENCOUNTER — Encounter: Payer: Self-pay | Admitting: Interventional Cardiology

## 2020-01-23 ENCOUNTER — Ambulatory Visit (INDEPENDENT_AMBULATORY_CARE_PROVIDER_SITE_OTHER): Payer: Medicare Other | Admitting: Interventional Cardiology

## 2020-01-23 VITALS — BP 142/86 | HR 68 | Ht 76.0 in | Wt 249.2 lb

## 2020-01-23 DIAGNOSIS — G4733 Obstructive sleep apnea (adult) (pediatric): Secondary | ICD-10-CM

## 2020-01-23 DIAGNOSIS — I4819 Other persistent atrial fibrillation: Secondary | ICD-10-CM

## 2020-01-23 DIAGNOSIS — I10 Essential (primary) hypertension: Secondary | ICD-10-CM

## 2020-01-23 DIAGNOSIS — E119 Type 2 diabetes mellitus without complications: Secondary | ICD-10-CM | POA: Diagnosis not present

## 2020-01-23 DIAGNOSIS — D6869 Other thrombophilia: Secondary | ICD-10-CM | POA: Diagnosis not present

## 2020-01-23 DIAGNOSIS — J849 Interstitial pulmonary disease, unspecified: Secondary | ICD-10-CM

## 2020-01-23 DIAGNOSIS — Z7189 Other specified counseling: Secondary | ICD-10-CM | POA: Diagnosis not present

## 2020-01-23 DIAGNOSIS — I951 Orthostatic hypotension: Secondary | ICD-10-CM | POA: Diagnosis not present

## 2020-01-23 NOTE — Patient Instructions (Signed)

## 2020-01-26 DIAGNOSIS — I129 Hypertensive chronic kidney disease with stage 1 through stage 4 chronic kidney disease, or unspecified chronic kidney disease: Secondary | ICD-10-CM | POA: Diagnosis not present

## 2020-01-26 DIAGNOSIS — I951 Orthostatic hypotension: Secondary | ICD-10-CM | POA: Diagnosis not present

## 2020-01-26 DIAGNOSIS — E1129 Type 2 diabetes mellitus with other diabetic kidney complication: Secondary | ICD-10-CM | POA: Diagnosis not present

## 2020-01-26 DIAGNOSIS — I480 Paroxysmal atrial fibrillation: Secondary | ICD-10-CM | POA: Diagnosis not present

## 2020-01-26 DIAGNOSIS — E1122 Type 2 diabetes mellitus with diabetic chronic kidney disease: Secondary | ICD-10-CM | POA: Diagnosis not present

## 2020-01-26 DIAGNOSIS — E559 Vitamin D deficiency, unspecified: Secondary | ICD-10-CM | POA: Diagnosis not present

## 2020-01-26 DIAGNOSIS — N183 Chronic kidney disease, stage 3 unspecified: Secondary | ICD-10-CM | POA: Diagnosis not present

## 2020-01-28 DIAGNOSIS — H35033 Hypertensive retinopathy, bilateral: Secondary | ICD-10-CM | POA: Diagnosis not present

## 2020-01-28 DIAGNOSIS — H26491 Other secondary cataract, right eye: Secondary | ICD-10-CM | POA: Diagnosis not present

## 2020-01-28 DIAGNOSIS — E113393 Type 2 diabetes mellitus with moderate nonproliferative diabetic retinopathy without macular edema, bilateral: Secondary | ICD-10-CM | POA: Diagnosis not present

## 2020-01-28 DIAGNOSIS — H1849 Other corneal degeneration: Secondary | ICD-10-CM | POA: Diagnosis not present

## 2020-02-06 ENCOUNTER — Other Ambulatory Visit: Payer: Self-pay

## 2020-02-06 ENCOUNTER — Ambulatory Visit (INDEPENDENT_AMBULATORY_CARE_PROVIDER_SITE_OTHER): Payer: Medicare Other | Admitting: Podiatry

## 2020-02-06 ENCOUNTER — Encounter: Payer: Self-pay | Admitting: Podiatry

## 2020-02-06 DIAGNOSIS — E1142 Type 2 diabetes mellitus with diabetic polyneuropathy: Secondary | ICD-10-CM

## 2020-02-06 DIAGNOSIS — M79675 Pain in left toe(s): Secondary | ICD-10-CM

## 2020-02-06 DIAGNOSIS — M79674 Pain in right toe(s): Secondary | ICD-10-CM | POA: Diagnosis not present

## 2020-02-06 DIAGNOSIS — D689 Coagulation defect, unspecified: Secondary | ICD-10-CM

## 2020-02-06 DIAGNOSIS — B351 Tinea unguium: Secondary | ICD-10-CM | POA: Diagnosis not present

## 2020-02-06 NOTE — Progress Notes (Signed)
This patient returns to my office for at risk foot care.  This patient requires this care by a professional since this patient will be at risk due to having diabetes mellitus and coagulation defect.  Patient is taking xarelto.  This  patient is unable to cut nails himself since the patient cannot reach his nails.These nails are painful walking and wearing shoes.  This patient presents for at risk foot care today.  General Appearance  Alert, conversant and in no acute stress.  Vascular  Dorsalis pedis and posterior tibial  pulses are palpable  bilaterally.  Capillary return is within normal limits  bilaterally. Temperature is within normal limits  bilaterally.  Neurologic  Senn-Weinstein monofilament wire test within normal limits  bilaterally. Muscle power within normal limits bilaterally.  Nails Thick disfigured discolored nails with subungual debris  from hallux to fifth toes bilaterally. No evidence of bacterial infection or drainage bilaterally.  Orthopedic  No limitations of motion  feet .  No crepitus or effusions noted.  No bony pathology or digital deformities noted. Hallux malleus right.  HAV  B/L.  Hammer toes 2-5  B/l.  Skin  normotropic skin with no porokeratosis noted bilaterally.  No signs of infections or ulcers noted.     Onychomycosis  Pain in right toes  Pain in left toes  Consent was obtained for treatment procedures.   Mechanical debridement of nails 1-5  bilaterally performed with a nail nipper.  Filed with dremel without incident.    Return office visit     3 months                Told patient to return for periodic foot care and evaluation due to potential at risk complications.   Layni Kreamer DPM  

## 2020-02-09 DIAGNOSIS — H26491 Other secondary cataract, right eye: Secondary | ICD-10-CM | POA: Diagnosis not present

## 2020-02-09 DIAGNOSIS — H26492 Other secondary cataract, left eye: Secondary | ICD-10-CM | POA: Diagnosis not present

## 2020-02-09 DIAGNOSIS — Z961 Presence of intraocular lens: Secondary | ICD-10-CM | POA: Diagnosis not present

## 2020-02-09 DIAGNOSIS — I10 Essential (primary) hypertension: Secondary | ICD-10-CM | POA: Diagnosis not present

## 2020-02-15 LAB — CUP PACEART REMOTE DEVICE CHECK
Date Time Interrogation Session: 20210819001423
Implantable Pulse Generator Implant Date: 20190924

## 2020-02-16 ENCOUNTER — Ambulatory Visit (INDEPENDENT_AMBULATORY_CARE_PROVIDER_SITE_OTHER): Payer: Medicare Other | Admitting: *Deleted

## 2020-02-16 DIAGNOSIS — H26491 Other secondary cataract, right eye: Secondary | ICD-10-CM | POA: Diagnosis not present

## 2020-02-16 DIAGNOSIS — I4819 Other persistent atrial fibrillation: Secondary | ICD-10-CM

## 2020-02-18 ENCOUNTER — Encounter (HOSPITAL_COMMUNITY): Payer: Self-pay | Admitting: Nurse Practitioner

## 2020-02-18 ENCOUNTER — Ambulatory Visit (HOSPITAL_COMMUNITY)
Admission: RE | Admit: 2020-02-18 | Discharge: 2020-02-18 | Disposition: A | Discharge status: Home / Self Care | Payer: Medicare Other | Source: Ambulatory Visit | Attending: Nurse Practitioner | Admitting: Nurse Practitioner

## 2020-02-18 ENCOUNTER — Other Ambulatory Visit: Payer: Self-pay

## 2020-02-18 VITALS — BP 150/72 | HR 68 | Ht 76.0 in | Wt 250.6 lb

## 2020-02-18 DIAGNOSIS — I951 Orthostatic hypotension: Secondary | ICD-10-CM | POA: Diagnosis not present

## 2020-02-18 DIAGNOSIS — E119 Type 2 diabetes mellitus without complications: Secondary | ICD-10-CM | POA: Insufficient documentation

## 2020-02-18 DIAGNOSIS — I4891 Unspecified atrial fibrillation: Secondary | ICD-10-CM | POA: Diagnosis not present

## 2020-02-18 DIAGNOSIS — E785 Hyperlipidemia, unspecified: Secondary | ICD-10-CM | POA: Diagnosis not present

## 2020-02-18 DIAGNOSIS — Z8249 Family history of ischemic heart disease and other diseases of the circulatory system: Secondary | ICD-10-CM | POA: Diagnosis not present

## 2020-02-18 DIAGNOSIS — Z7901 Long term (current) use of anticoagulants: Secondary | ICD-10-CM | POA: Insufficient documentation

## 2020-02-18 DIAGNOSIS — Z923 Personal history of irradiation: Secondary | ICD-10-CM | POA: Insufficient documentation

## 2020-02-18 DIAGNOSIS — I4819 Other persistent atrial fibrillation: Secondary | ICD-10-CM | POA: Diagnosis not present

## 2020-02-18 DIAGNOSIS — G4733 Obstructive sleep apnea (adult) (pediatric): Secondary | ICD-10-CM | POA: Insufficient documentation

## 2020-02-18 DIAGNOSIS — Z79899 Other long term (current) drug therapy: Secondary | ICD-10-CM | POA: Insufficient documentation

## 2020-02-18 DIAGNOSIS — I2510 Atherosclerotic heart disease of native coronary artery without angina pectoris: Secondary | ICD-10-CM | POA: Diagnosis not present

## 2020-02-18 DIAGNOSIS — E039 Hypothyroidism, unspecified: Secondary | ICD-10-CM | POA: Diagnosis not present

## 2020-02-18 DIAGNOSIS — Z85819 Personal history of malignant neoplasm of unspecified site of lip, oral cavity, and pharynx: Secondary | ICD-10-CM | POA: Insufficient documentation

## 2020-02-18 DIAGNOSIS — Z79890 Hormone replacement therapy (postmenopausal): Secondary | ICD-10-CM | POA: Diagnosis not present

## 2020-02-18 DIAGNOSIS — D6869 Other thrombophilia: Secondary | ICD-10-CM | POA: Diagnosis not present

## 2020-02-18 DIAGNOSIS — Z886 Allergy status to analgesic agent status: Secondary | ICD-10-CM | POA: Insufficient documentation

## 2020-02-18 DIAGNOSIS — J849 Interstitial pulmonary disease, unspecified: Secondary | ICD-10-CM | POA: Diagnosis not present

## 2020-02-18 DIAGNOSIS — Z9221 Personal history of antineoplastic chemotherapy: Secondary | ICD-10-CM | POA: Insufficient documentation

## 2020-02-18 DIAGNOSIS — I10 Essential (primary) hypertension: Secondary | ICD-10-CM | POA: Insufficient documentation

## 2020-02-18 DIAGNOSIS — K219 Gastro-esophageal reflux disease without esophagitis: Secondary | ICD-10-CM | POA: Diagnosis not present

## 2020-02-18 NOTE — Progress Notes (Signed)
Primary Care Physician: [name], MD Referring Physician: Dr. [name] Cardiologist: Dr. [name] Pulmonologist: Dr. Eustace Moore is a 69 y.o. male with a h/o afib, s/p ablation in December, previously on amiodarone, now off since March 2012.  with presence of interstitial  lung disease. He reports that he feels well. He remains in SR. Paceart reports reviewed and he has very little afib burden.  His shortness of breath has improved. He has had some issues with orthostatic hypotension followed closely by Dr.  [name]. CHA2DS2VASc score of 4, on xarelto.   Today, he denies symptoms of palpitations, chest pain, shortness of breath, orthopnea, PND, lower extremity edema, dizziness, presyncope, syncope, or neurologic sequela. The patient is tolerating medications without difficulties and is otherwise without complaint today.   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 Valere Dross and [facility]    Sleep apnea    tonsillar ca dx'd 02/2006   xrt/chemo comp 43/3295   Uncomplicated type 2 diabetes mellitus ([place]) 03/10/2014   Past Surgical History:  Procedure Laterality Date   ATRIAL FIBRILLATION ABLATION N/A 06/06/2019   Procedure: ATRIAL FIBRILLATION ABLATION;  Surgeon: Thompson Grayer, MD;  Location: [place] [facility];  Service: Cardiovascular;  Laterality: N/A;   BREAST SURGERY     CARDIAC  CATHETERIZATION     CARDIOVERSION N/A 10/14/2015   Procedure: CARDIOVERSION;  Surgeon: Lelon Perla, MD;  Location: [place];  Service: Cardiovascular;  Laterality: N/A;   COLONOSCOPY WITH PROPOFOL N/A 06/03/2015   Procedure: COLONOSCOPY WITH PROPOFOL;  Surgeon: Laurence Spates, MD;  Location: [facility] ENDOS[facility];  Service: Endoscopy;  Laterality: N/A;   COLONOSCOPY WITH PROPOFOL N/A 06/12/2018   Procedure: COLONOSCOPY WITH PROPOFOL;  Surgeon: Laurence Spates, MD;  Location: [facility] ENDOSCOPY;  Service: Endoscopy;  Laterality: N/A;   HOT HEMOSTASIS N/A 06/03/2015   Procedure: HOT HEMOSTASIS (ARGON PLASMA COAGULATION/BICAP);  Surgeon: Laurence Spates, MD;  Location: [facility];  Service: Endoscopy;  Laterality: N/A;   LOOP RECORDER INSERTION N/A 03/19/2018   Procedure: LOOP RECORDER INSERTION;  Surgeon: Thompson Grayer, MD;  Location: [place] [facility];  Service: Cardiovascular;  Laterality: N/A;    Current Outpatient Medications  Medication Sig Dispense Refill   acetaminophen (TYLENOL) 500 MG tablet Take 500 mg by mouth 2 (two) times daily.     amLODipine (NORVASC) 2.5 MG tablet Take 1 tablet (2.5 mg total) by mouth daily. 90 tablet 3   desonide (DESOWEN) 0.05 % cream Apply 1 application topically 2 (two) times daily as needed (dry skin). Behind ears.      doxazosin (CARDURA) 4 MG tablet Take 4 mg by mouth at bedtime.      levothyroxine (SYNTHROID) 125 MCG tablet Take 125 mcg by mouth daily.     loratadine (CLARITIN) 10 MG tablet Take 10 mg by mouth daily.      pravastatin (PRAVACHOL) 80 MG tablet Take 80 mg by mouth at bedtime.  quinapril (ACCUPRIL) 20 MG tablet Take 1 tablet (20 mg total) by mouth daily. 270 tablet 3   XARELTO 20 MG TABS tablet TAKE ONE TABLET BY MOUTH DAILY WITH SUPPER 30 tablet 5   No current facility-administered medications for this encounter.    Allergies  Allergen Reactions   Nsaids Other (See Comments)    REACTION: PER MD REQUEST NOT TO TAKE     Social History   Socioeconomic History   Marital status: Widowed    Spouse name: Not on file   Number of children: Not on file   Years of education: Not on file   Highest education level: Not on file  Occupational History   Not on file  Tobacco Use   Smoking status: Never Smoker   Smokeless tobacco: Never Used  Vaping Use   Vaping Use: Never used  Substance and Sexual Activity   Alcohol use: No    Alcohol/week: 0.0 standard drinks   Drug use: No   Sexual activity: Not on file  Other Topics Concern   Not on file  Social History Narrative   Not on file   Social Determinants of Health   Financial Resource Strain:    Difficulty of Paying Living Expenses: Not on file  Food Insecurity:    Worried About [place] in the Last Year: Not on file   Ran Out of Food in the Last Year: Not on file  Transportation Needs:    Lack of Transportation (Medical): Not on file   Lack of Transportation (Non-Medical): Not on file  Physical Activity:    Days of Exercise per Week: Not on file   Minutes of Exercise per Session: Not on file  Stress:    Feeling of Stress : Not on file  Social Connections:    Frequency of Communication with Friends and Family: Not on file   Frequency of Social Gatherings with Friends and Family: Not on file   Attends Religious Services: Not on file   Active Member of Clubs or Organizations: Not on file   Attends Archivist Meetings: Not on file   Marital Status: Not on file  Intimate Partner Violence:    Fear of Current or Ex-Partner: Not on file   Emotionally Abused: Not on file   Physically Abused: Not on file   Sexually Abused: Not on file    Family History  Problem Relation Age of Onset   Breast cancer Mother    Coronary artery disease Father     ROS- All systems are reviewed and negative except as per the HPI above  Physical Exam: Vitals:   02/18/20 0835  Weight: 113.7 kg  Height: 6\' 4"   (1.93 m)   Wt Readings from Last 3 Encounters:  02/18/20 113.7 kg  01/23/20 (!) 113 kg  12/23/19 114.5 kg    Labs: Lab Results  Component Value Date   NA 138 07/08/2019   K 4.2 07/08/2019   CL 107 07/08/2019   CO2 21 (L) 07/08/2019   GLUCOSE 124 (H) 07/08/2019   BUN 22 07/08/2019   CREATININE 2.00 (H) 07/08/2019   CALCIUM 8.9 07/08/2019   MG 1.8 09/04/2018   Lab Results  Component Value Date   INR 2.3 05/12/2019   No results found for: CHOL, HDL, LDLCALC, TRIG   GEN- The patient is well appearing, alert and oriented x 3 today.   Head- normocephalic, atraumatic Eyes-  Sclera clear, conjunctiva pink Ears- hearing intact Oropharynx- clear Neck- supple, no  JVP Lymph- no cervical lymphadenopathy Lungs- Clear to ausculation bilaterally, normal work of breathing Heart- Regular rate and rhythm, no murmurs, rubs or gallops, PMI not laterally displaced GI- soft, NT, ND, + BS Extremities- no clubbing, cyanosis, or edema MS- no significant deformity or atrophy Skin- no rash or lesion Psych- euthymic mood, full affect Neuro- strength and sensation are intact  EKG-NSR at 68 bpm, pr int 184 ms, qrs int 94 ms, qtc 450 ms    Assessment and Plan: 1. Afib Quiet since ablation and off Amiodarone since March 2021  2. CHA2DS2VASc score of 4 Continue xarelto 20 mg daily    3. HTN Stable  Followed closely by Dr. [name]   4. CAD No anginal symptoms   5. OSA Continue  cpap  F/u with Dr. [name] November 2021 as scheduled   Geroge Baseman. Teyon Odette, [facility] [address] [place], [place] [address] [phone]

## 2020-02-20 NOTE — Progress Notes (Signed)
Carelink Summary Report / Loop Recorder 

## 2020-02-22 DIAGNOSIS — I10 Essential (primary) hypertension: Secondary | ICD-10-CM | POA: Diagnosis not present

## 2020-02-22 DIAGNOSIS — E118 Type 2 diabetes mellitus with unspecified complications: Secondary | ICD-10-CM | POA: Diagnosis not present

## 2020-02-22 DIAGNOSIS — E782 Mixed hyperlipidemia: Secondary | ICD-10-CM | POA: Diagnosis not present

## 2020-02-22 DIAGNOSIS — N183 Chronic kidney disease, stage 3 unspecified: Secondary | ICD-10-CM | POA: Diagnosis not present

## 2020-02-22 DIAGNOSIS — I4891 Unspecified atrial fibrillation: Secondary | ICD-10-CM | POA: Diagnosis not present

## 2020-02-22 DIAGNOSIS — I480 Paroxysmal atrial fibrillation: Secondary | ICD-10-CM | POA: Diagnosis not present

## 2020-02-22 DIAGNOSIS — I25119 Atherosclerotic heart disease of native coronary artery with unspecified angina pectoris: Secondary | ICD-10-CM | POA: Diagnosis not present

## 2020-02-22 DIAGNOSIS — E7800 Pure hypercholesterolemia, unspecified: Secondary | ICD-10-CM | POA: Diagnosis not present

## 2020-02-22 DIAGNOSIS — E039 Hypothyroidism, unspecified: Secondary | ICD-10-CM | POA: Diagnosis not present

## 2020-02-22 DIAGNOSIS — N189 Chronic kidney disease, unspecified: Secondary | ICD-10-CM | POA: Diagnosis not present

## 2020-02-22 DIAGNOSIS — E1122 Type 2 diabetes mellitus with diabetic chronic kidney disease: Secondary | ICD-10-CM | POA: Diagnosis not present

## 2020-02-26 ENCOUNTER — Telehealth: Payer: Self-pay | Admitting: Pulmonary Disease

## 2020-02-26 NOTE — Telephone Encounter (Signed)
Copied from 12/30/19 CT chest results:  Most of the scarring/changes noted on CT from December 2020 has resolved, must have been related to amiodarone No evidence of fibrosis. -Mild changes of bronchiectasis/damage to smaller airways -which should not likely affect him -Tiny nodules noted 4 mm or less likely of no consequence but we may suggest follow-up CT in 1 year   Spoke with pt, aware of recs.  Nothing further needed at this time- will close encounter.

## 2020-02-27 DIAGNOSIS — H26492 Other secondary cataract, left eye: Secondary | ICD-10-CM | POA: Diagnosis not present

## 2020-03-01 ENCOUNTER — Other Ambulatory Visit: Payer: Self-pay | Admitting: Interventional Cardiology

## 2020-03-04 DIAGNOSIS — N183 Chronic kidney disease, stage 3 unspecified: Secondary | ICD-10-CM | POA: Diagnosis not present

## 2020-03-04 DIAGNOSIS — I10 Essential (primary) hypertension: Secondary | ICD-10-CM | POA: Diagnosis not present

## 2020-03-04 DIAGNOSIS — E782 Mixed hyperlipidemia: Secondary | ICD-10-CM | POA: Diagnosis not present

## 2020-03-04 DIAGNOSIS — I480 Paroxysmal atrial fibrillation: Secondary | ICD-10-CM | POA: Diagnosis not present

## 2020-03-04 DIAGNOSIS — E118 Type 2 diabetes mellitus with unspecified complications: Secondary | ICD-10-CM | POA: Diagnosis not present

## 2020-03-04 DIAGNOSIS — N189 Chronic kidney disease, unspecified: Secondary | ICD-10-CM | POA: Diagnosis not present

## 2020-03-04 DIAGNOSIS — E1122 Type 2 diabetes mellitus with diabetic chronic kidney disease: Secondary | ICD-10-CM | POA: Diagnosis not present

## 2020-03-04 DIAGNOSIS — I25119 Atherosclerotic heart disease of native coronary artery with unspecified angina pectoris: Secondary | ICD-10-CM | POA: Diagnosis not present

## 2020-03-04 DIAGNOSIS — I4891 Unspecified atrial fibrillation: Secondary | ICD-10-CM | POA: Diagnosis not present

## 2020-03-04 DIAGNOSIS — E039 Hypothyroidism, unspecified: Secondary | ICD-10-CM | POA: Diagnosis not present

## 2020-03-04 DIAGNOSIS — E7800 Pure hypercholesterolemia, unspecified: Secondary | ICD-10-CM | POA: Diagnosis not present

## 2020-03-19 ENCOUNTER — Encounter: Payer: Self-pay | Admitting: Pulmonary Disease

## 2020-03-19 ENCOUNTER — Ambulatory Visit (INDEPENDENT_AMBULATORY_CARE_PROVIDER_SITE_OTHER): Payer: Medicare Other | Admitting: Pulmonary Disease

## 2020-03-19 ENCOUNTER — Other Ambulatory Visit: Payer: Self-pay

## 2020-03-19 VITALS — BP 120/74 | HR 62 | Temp 97.0°F | Ht 76.0 in | Wt 251.0 lb

## 2020-03-19 DIAGNOSIS — J479 Bronchiectasis, uncomplicated: Secondary | ICD-10-CM | POA: Diagnosis not present

## 2020-03-19 DIAGNOSIS — Z23 Encounter for immunization: Secondary | ICD-10-CM

## 2020-03-19 DIAGNOSIS — J849 Interstitial pulmonary disease, unspecified: Secondary | ICD-10-CM | POA: Diagnosis not present

## 2020-03-19 NOTE — Addendum Note (Signed)
Addended by: Valerie Salts on: 03/19/2020 10:07 AM   Modules accepted: Orders

## 2020-03-19 NOTE — Assessment & Plan Note (Signed)
Improvement in ILD is consistent with amiodarone especially the time sequence of events. I am not impressed with the nodules noted by the radiologist.  Doubt we have to consider hypersensitivity pneumonitis here.
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
## 2020-03-19 NOTE — Patient Instructions (Signed)
CT appears improved FLu shot today High-resolution CT chest in July 2022 and follow-up after

## 2020-03-19 NOTE — Assessment & Plan Note (Signed)
He does have mild bronchiectasis on my review.  I doubt that this is of any consequence for him.  This may be related to aspiration insult given his dysphagia from radiation for throat cancer We will see if there is progression on his follow-up CT in a year's time

## 2020-03-21 LAB — CUP PACEART REMOTE DEVICE CHECK
Date Time Interrogation Session: 20210919003019
Implantable Pulse Generator Implant Date: 20190924

## 2020-03-22 ENCOUNTER — Ambulatory Visit (INDEPENDENT_AMBULATORY_CARE_PROVIDER_SITE_OTHER): Payer: Medicare Other | Admitting: Emergency Medicine

## 2020-03-22 DIAGNOSIS — I4819 Other persistent atrial fibrillation: Secondary | ICD-10-CM | POA: Diagnosis not present

## 2020-03-24 NOTE — Progress Notes (Signed)
Carelink Summary Report / Loop Recorder 

## 2020-03-30 DIAGNOSIS — Z23 Encounter for immunization: Secondary | ICD-10-CM | POA: Diagnosis not present

## 2020-04-06 DIAGNOSIS — N189 Chronic kidney disease, unspecified: Secondary | ICD-10-CM | POA: Diagnosis not present

## 2020-04-06 DIAGNOSIS — E118 Type 2 diabetes mellitus with unspecified complications: Secondary | ICD-10-CM | POA: Diagnosis not present

## 2020-04-06 DIAGNOSIS — I4891 Unspecified atrial fibrillation: Secondary | ICD-10-CM | POA: Diagnosis not present

## 2020-04-06 DIAGNOSIS — I480 Paroxysmal atrial fibrillation: Secondary | ICD-10-CM | POA: Diagnosis not present

## 2020-04-06 DIAGNOSIS — E039 Hypothyroidism, unspecified: Secondary | ICD-10-CM | POA: Diagnosis not present

## 2020-04-06 DIAGNOSIS — I10 Essential (primary) hypertension: Secondary | ICD-10-CM | POA: Diagnosis not present

## 2020-04-06 DIAGNOSIS — E7800 Pure hypercholesterolemia, unspecified: Secondary | ICD-10-CM | POA: Diagnosis not present

## 2020-04-06 DIAGNOSIS — N183 Chronic kidney disease, stage 3 unspecified: Secondary | ICD-10-CM | POA: Diagnosis not present

## 2020-04-06 DIAGNOSIS — I25119 Atherosclerotic heart disease of native coronary artery with unspecified angina pectoris: Secondary | ICD-10-CM | POA: Diagnosis not present

## 2020-04-06 DIAGNOSIS — E1122 Type 2 diabetes mellitus with diabetic chronic kidney disease: Secondary | ICD-10-CM | POA: Diagnosis not present

## 2020-04-06 DIAGNOSIS — E782 Mixed hyperlipidemia: Secondary | ICD-10-CM | POA: Diagnosis not present

## 2020-04-14 ENCOUNTER — Ambulatory Visit (INDEPENDENT_AMBULATORY_CARE_PROVIDER_SITE_OTHER): Payer: Medicare Other

## 2020-04-14 DIAGNOSIS — I4819 Other persistent atrial fibrillation: Secondary | ICD-10-CM | POA: Diagnosis not present

## 2020-04-14 LAB — CUP PACEART REMOTE DEVICE CHECK
Date Time Interrogation Session: 20211020022257
Implantable Pulse Generator Implant Date: 20190924

## 2020-04-20 NOTE — Progress Notes (Signed)
Carelink Summary Report / Loop Recorder 

## 2020-04-26 ENCOUNTER — Other Ambulatory Visit: Payer: Self-pay | Admitting: Interventional Cardiology

## 2020-04-27 NOTE — Telephone Encounter (Signed)
Pt last saw Roderic Palau, NP 02/18/20, last labs 07/08/19 Creat 2.0, age 69, weight 113.9kg, CrCl 56.95, based on CrCl pt is on appropriate dosage of Xarelto 20mg  QD.  Will refill rx.

## 2020-04-28 DIAGNOSIS — E559 Vitamin D deficiency, unspecified: Secondary | ICD-10-CM | POA: Diagnosis not present

## 2020-04-28 DIAGNOSIS — E039 Hypothyroidism, unspecified: Secondary | ICD-10-CM | POA: Diagnosis not present

## 2020-05-06 DIAGNOSIS — Z8669 Personal history of other diseases of the nervous system and sense organs: Secondary | ICD-10-CM | POA: Diagnosis not present

## 2020-05-06 DIAGNOSIS — Z85819 Personal history of malignant neoplasm of unspecified site of lip, oral cavity, and pharynx: Secondary | ICD-10-CM | POA: Diagnosis not present

## 2020-05-06 DIAGNOSIS — I10 Essential (primary) hypertension: Secondary | ICD-10-CM | POA: Diagnosis not present

## 2020-05-06 DIAGNOSIS — E039 Hypothyroidism, unspecified: Secondary | ICD-10-CM | POA: Diagnosis not present

## 2020-05-06 DIAGNOSIS — E559 Vitamin D deficiency, unspecified: Secondary | ICD-10-CM | POA: Diagnosis not present

## 2020-05-06 DIAGNOSIS — Z6831 Body mass index (BMI) 31.0-31.9, adult: Secondary | ICD-10-CM | POA: Diagnosis not present

## 2020-05-10 DIAGNOSIS — N183 Chronic kidney disease, stage 3 unspecified: Secondary | ICD-10-CM | POA: Diagnosis not present

## 2020-05-10 DIAGNOSIS — E1122 Type 2 diabetes mellitus with diabetic chronic kidney disease: Secondary | ICD-10-CM | POA: Diagnosis not present

## 2020-05-10 DIAGNOSIS — E782 Mixed hyperlipidemia: Secondary | ICD-10-CM | POA: Diagnosis not present

## 2020-05-10 DIAGNOSIS — E7800 Pure hypercholesterolemia, unspecified: Secondary | ICD-10-CM | POA: Diagnosis not present

## 2020-05-10 DIAGNOSIS — E118 Type 2 diabetes mellitus with unspecified complications: Secondary | ICD-10-CM | POA: Diagnosis not present

## 2020-05-10 DIAGNOSIS — E039 Hypothyroidism, unspecified: Secondary | ICD-10-CM | POA: Diagnosis not present

## 2020-05-10 DIAGNOSIS — I480 Paroxysmal atrial fibrillation: Secondary | ICD-10-CM | POA: Diagnosis not present

## 2020-05-10 DIAGNOSIS — N189 Chronic kidney disease, unspecified: Secondary | ICD-10-CM | POA: Diagnosis not present

## 2020-05-10 DIAGNOSIS — I10 Essential (primary) hypertension: Secondary | ICD-10-CM | POA: Diagnosis not present

## 2020-05-10 DIAGNOSIS — I25119 Atherosclerotic heart disease of native coronary artery with unspecified angina pectoris: Secondary | ICD-10-CM | POA: Diagnosis not present

## 2020-05-10 DIAGNOSIS — I4891 Unspecified atrial fibrillation: Secondary | ICD-10-CM | POA: Diagnosis not present

## 2020-05-14 ENCOUNTER — Other Ambulatory Visit: Payer: Self-pay

## 2020-05-14 ENCOUNTER — Encounter: Payer: Self-pay | Admitting: Podiatry

## 2020-05-14 ENCOUNTER — Ambulatory Visit (INDEPENDENT_AMBULATORY_CARE_PROVIDER_SITE_OTHER): Payer: Medicare Other | Admitting: Podiatry

## 2020-05-14 DIAGNOSIS — B351 Tinea unguium: Secondary | ICD-10-CM

## 2020-05-14 DIAGNOSIS — E1142 Type 2 diabetes mellitus with diabetic polyneuropathy: Secondary | ICD-10-CM | POA: Diagnosis not present

## 2020-05-14 DIAGNOSIS — M79675 Pain in left toe(s): Secondary | ICD-10-CM | POA: Diagnosis not present

## 2020-05-14 DIAGNOSIS — M79674 Pain in right toe(s): Secondary | ICD-10-CM | POA: Diagnosis not present

## 2020-05-14 DIAGNOSIS — D689 Coagulation defect, unspecified: Secondary | ICD-10-CM

## 2020-05-17 ENCOUNTER — Ambulatory Visit (INDEPENDENT_AMBULATORY_CARE_PROVIDER_SITE_OTHER): Payer: Medicare Other

## 2020-05-17 DIAGNOSIS — I4819 Other persistent atrial fibrillation: Secondary | ICD-10-CM

## 2020-05-17 LAB — CUP PACEART REMOTE DEVICE CHECK
Date Time Interrogation Session: 20211120013351
Implantable Pulse Generator Implant Date: 20190924

## 2020-05-19 NOTE — Progress Notes (Signed)
Carelink Summary Report / Loop Recorder 

## 2020-06-14 DIAGNOSIS — Z961 Presence of intraocular lens: Secondary | ICD-10-CM | POA: Diagnosis not present

## 2020-06-14 DIAGNOSIS — E113393 Type 2 diabetes mellitus with moderate nonproliferative diabetic retinopathy without macular edema, bilateral: Secondary | ICD-10-CM | POA: Diagnosis not present

## 2020-06-14 DIAGNOSIS — I10 Essential (primary) hypertension: Secondary | ICD-10-CM | POA: Diagnosis not present

## 2020-06-14 DIAGNOSIS — H35039 Hypertensive retinopathy, unspecified eye: Secondary | ICD-10-CM | POA: Diagnosis not present

## 2020-06-15 DIAGNOSIS — N183 Chronic kidney disease, stage 3 unspecified: Secondary | ICD-10-CM | POA: Diagnosis not present

## 2020-06-15 DIAGNOSIS — E039 Hypothyroidism, unspecified: Secondary | ICD-10-CM | POA: Diagnosis not present

## 2020-06-15 DIAGNOSIS — I480 Paroxysmal atrial fibrillation: Secondary | ICD-10-CM | POA: Diagnosis not present

## 2020-06-15 DIAGNOSIS — I4891 Unspecified atrial fibrillation: Secondary | ICD-10-CM | POA: Diagnosis not present

## 2020-06-15 DIAGNOSIS — E1122 Type 2 diabetes mellitus with diabetic chronic kidney disease: Secondary | ICD-10-CM | POA: Diagnosis not present

## 2020-06-15 DIAGNOSIS — N189 Chronic kidney disease, unspecified: Secondary | ICD-10-CM | POA: Diagnosis not present

## 2020-06-15 DIAGNOSIS — E118 Type 2 diabetes mellitus with unspecified complications: Secondary | ICD-10-CM | POA: Diagnosis not present

## 2020-06-15 DIAGNOSIS — E782 Mixed hyperlipidemia: Secondary | ICD-10-CM | POA: Diagnosis not present

## 2020-06-15 DIAGNOSIS — E7800 Pure hypercholesterolemia, unspecified: Secondary | ICD-10-CM | POA: Diagnosis not present

## 2020-06-15 DIAGNOSIS — I25119 Atherosclerotic heart disease of native coronary artery with unspecified angina pectoris: Secondary | ICD-10-CM | POA: Diagnosis not present

## 2020-06-15 DIAGNOSIS — I10 Essential (primary) hypertension: Secondary | ICD-10-CM | POA: Diagnosis not present

## 2020-06-15 LAB — CUP PACEART REMOTE DEVICE CHECK
Date Time Interrogation Session: 20211221013734
Implantable Pulse Generator Implant Date: 20190924

## 2020-06-21 ENCOUNTER — Ambulatory Visit (INDEPENDENT_AMBULATORY_CARE_PROVIDER_SITE_OTHER): Payer: Medicare Other

## 2020-06-21 DIAGNOSIS — I4819 Other persistent atrial fibrillation: Secondary | ICD-10-CM

## 2020-07-02 NOTE — Progress Notes (Signed)
Carelink Summary Report / Loop Recorder 

## 2020-07-05 ENCOUNTER — Other Ambulatory Visit: Payer: Self-pay | Admitting: *Deleted

## 2020-07-20 DIAGNOSIS — E1122 Type 2 diabetes mellitus with diabetic chronic kidney disease: Secondary | ICD-10-CM | POA: Diagnosis not present

## 2020-07-20 DIAGNOSIS — I10 Essential (primary) hypertension: Secondary | ICD-10-CM | POA: Diagnosis not present

## 2020-07-20 DIAGNOSIS — E782 Mixed hyperlipidemia: Secondary | ICD-10-CM | POA: Diagnosis not present

## 2020-07-20 DIAGNOSIS — N183 Chronic kidney disease, stage 3 unspecified: Secondary | ICD-10-CM | POA: Diagnosis not present

## 2020-07-20 DIAGNOSIS — E118 Type 2 diabetes mellitus with unspecified complications: Secondary | ICD-10-CM | POA: Diagnosis not present

## 2020-07-20 DIAGNOSIS — N189 Chronic kidney disease, unspecified: Secondary | ICD-10-CM | POA: Diagnosis not present

## 2020-07-20 DIAGNOSIS — E039 Hypothyroidism, unspecified: Secondary | ICD-10-CM | POA: Diagnosis not present

## 2020-07-20 DIAGNOSIS — I25119 Atherosclerotic heart disease of native coronary artery with unspecified angina pectoris: Secondary | ICD-10-CM | POA: Diagnosis not present

## 2020-07-20 DIAGNOSIS — E7800 Pure hypercholesterolemia, unspecified: Secondary | ICD-10-CM | POA: Diagnosis not present

## 2020-07-20 DIAGNOSIS — I4891 Unspecified atrial fibrillation: Secondary | ICD-10-CM | POA: Diagnosis not present

## 2020-07-26 ENCOUNTER — Ambulatory Visit (INDEPENDENT_AMBULATORY_CARE_PROVIDER_SITE_OTHER): Payer: Medicare Other

## 2020-07-26 DIAGNOSIS — I480 Paroxysmal atrial fibrillation: Secondary | ICD-10-CM | POA: Diagnosis not present

## 2020-07-26 LAB — CUP PACEART REMOTE DEVICE CHECK
Date Time Interrogation Session: 20220129231255
Implantable Pulse Generator Implant Date: 20190924
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
## 2020-07-27 ENCOUNTER — Encounter: Payer: Self-pay | Admitting: Interventional Cardiology

## 2020-07-27 ENCOUNTER — Ambulatory Visit (INDEPENDENT_AMBULATORY_CARE_PROVIDER_SITE_OTHER): Payer: Medicare Other | Admitting: Interventional Cardiology

## 2020-07-27 ENCOUNTER — Other Ambulatory Visit: Payer: Self-pay

## 2020-07-27 VITALS — BP 162/84 | HR 76 | Ht 76.0 in | Wt 249.6 lb

## 2020-07-27 DIAGNOSIS — G4733 Obstructive sleep apnea (adult) (pediatric): Secondary | ICD-10-CM | POA: Diagnosis not present

## 2020-07-27 DIAGNOSIS — I10 Essential (primary) hypertension: Secondary | ICD-10-CM | POA: Diagnosis not present

## 2020-07-27 DIAGNOSIS — I4819 Other persistent atrial fibrillation: Secondary | ICD-10-CM

## 2020-07-27 DIAGNOSIS — J849 Interstitial pulmonary disease, unspecified: Secondary | ICD-10-CM

## 2020-07-27 DIAGNOSIS — Z7189 Other specified counseling: Secondary | ICD-10-CM | POA: Diagnosis not present

## 2020-07-27 DIAGNOSIS — D6869 Other thrombophilia: Secondary | ICD-10-CM | POA: Diagnosis not present

## 2020-07-27 DIAGNOSIS — E119 Type 2 diabetes mellitus without complications: Secondary | ICD-10-CM | POA: Diagnosis not present

## 2020-07-27 NOTE — Patient Instructions (Signed)

## 2020-07-27 NOTE — Addendum Note (Signed)
Addended by: Loren Racer on: 07/27/2020 08:51 AM   Modules accepted: Orders

## 2020-07-29 DIAGNOSIS — I480 Paroxysmal atrial fibrillation: Secondary | ICD-10-CM | POA: Diagnosis not present

## 2020-07-29 DIAGNOSIS — E1122 Type 2 diabetes mellitus with diabetic chronic kidney disease: Secondary | ICD-10-CM | POA: Diagnosis not present

## 2020-07-29 DIAGNOSIS — E782 Mixed hyperlipidemia: Secondary | ICD-10-CM | POA: Diagnosis not present

## 2020-07-29 DIAGNOSIS — E039 Hypothyroidism, unspecified: Secondary | ICD-10-CM | POA: Diagnosis not present

## 2020-07-29 DIAGNOSIS — N183 Chronic kidney disease, stage 3 unspecified: Secondary | ICD-10-CM | POA: Diagnosis not present

## 2020-07-29 DIAGNOSIS — I10 Essential (primary) hypertension: Secondary | ICD-10-CM | POA: Diagnosis not present

## 2020-07-29 DIAGNOSIS — E7800 Pure hypercholesterolemia, unspecified: Secondary | ICD-10-CM | POA: Diagnosis not present

## 2020-07-29 DIAGNOSIS — I25119 Atherosclerotic heart disease of native coronary artery with unspecified angina pectoris: Secondary | ICD-10-CM | POA: Diagnosis not present

## 2020-08-04 NOTE — Progress Notes (Signed)
Carelink Summary Report / Loop Recorder 

## 2020-08-20 ENCOUNTER — Other Ambulatory Visit: Payer: Self-pay

## 2020-08-20 ENCOUNTER — Ambulatory Visit (INDEPENDENT_AMBULATORY_CARE_PROVIDER_SITE_OTHER): Payer: Medicare Other | Admitting: Podiatry

## 2020-08-20 ENCOUNTER — Encounter: Payer: Self-pay | Admitting: Podiatry

## 2020-08-20 DIAGNOSIS — B351 Tinea unguium: Secondary | ICD-10-CM

## 2020-08-20 DIAGNOSIS — M79675 Pain in left toe(s): Secondary | ICD-10-CM

## 2020-08-20 DIAGNOSIS — D689 Coagulation defect, unspecified: Secondary | ICD-10-CM

## 2020-08-20 DIAGNOSIS — E1142 Type 2 diabetes mellitus with diabetic polyneuropathy: Secondary | ICD-10-CM | POA: Diagnosis not present

## 2020-08-20 DIAGNOSIS — M79674 Pain in right toe(s): Secondary | ICD-10-CM

## 2020-08-20 NOTE — Progress Notes (Signed)
This patient returns to my office for at risk foot care.  This patient requires this care by a professional since this patient will be at risk due to having diabetes mellitus and coagulation defect.  Patient is taking xarelto.  This  patient is unable to cut nails himself since the patient cannot reach his nails.These nails are painful walking and wearing shoes.  This patient presents for at risk foot care today.  General Appearance  Alert, conversant and in no acute stress.  Vascular  Dorsalis pedis and posterior tibial  pulses are palpable  bilaterally.  Capillary return is within normal limits  bilaterally. Temperature is within normal limits  bilaterally.  Neurologic  Senn-Weinstein monofilament wire test within normal limits  bilaterally. Muscle power within normal limits bilaterally.  Nails Thick disfigured discolored nails with subungual debris  from hallux to fifth toes bilaterally. No evidence of bacterial infection or drainage bilaterally.  Orthopedic  No limitations of motion  feet .  No crepitus or effusions noted.  No bony pathology or digital deformities noted. Hallux malleus right.  HAV  B/L.  Hammer toes 2-5  B/l.  Skin  normotropic skin with no porokeratosis noted bilaterally.  No signs of infections or ulcers noted.     Onychomycosis  Pain in right toes  Pain in left toes  Consent was obtained for treatment procedures.   Mechanical debridement of nails 1-5  bilaterally performed with a nail nipper.  Filed with dremel without incident.    Return office visit     3 months                Told patient to return for periodic foot care and evaluation due to potential at risk complications.   Gardiner Barefoot DPM

## 2020-08-23 NOTE — Telephone Encounter (Signed)
Called patient to advise his ILR shows no alerts for time frame where he was not feeling well which was 08/22/20 early in the morning. Patient did have frequent PVC's noted during recordings. Patient reports his symptoms were lightheaded and off balance. Advised I will forward to Dr. [name] for review and we will call with any changes. ED precautions reviewed with patient. Patient agreeable to plan.

## 2020-08-25 LAB — CUP PACEART REMOTE DEVICE CHECK
Date Time Interrogation Session: 20220301231813
Implantable Pulse Generator Implant Date: 20190924

## 2020-08-27 DIAGNOSIS — Z1389 Encounter for screening for other disorder: Secondary | ICD-10-CM | POA: Diagnosis not present

## 2020-08-27 DIAGNOSIS — Z0000 Encounter for general adult medical examination without abnormal findings: Secondary | ICD-10-CM | POA: Diagnosis not present

## 2020-08-30 ENCOUNTER — Ambulatory Visit (INDEPENDENT_AMBULATORY_CARE_PROVIDER_SITE_OTHER): Payer: Medicare Other

## 2020-08-30 DIAGNOSIS — I4819 Other persistent atrial fibrillation: Secondary | ICD-10-CM | POA: Diagnosis not present

## 2020-09-08 DIAGNOSIS — N183 Chronic kidney disease, stage 3 unspecified: Secondary | ICD-10-CM | POA: Diagnosis not present

## 2020-09-08 DIAGNOSIS — I10 Essential (primary) hypertension: Secondary | ICD-10-CM | POA: Diagnosis not present

## 2020-09-08 DIAGNOSIS — E7800 Pure hypercholesterolemia, unspecified: Secondary | ICD-10-CM | POA: Diagnosis not present

## 2020-09-08 DIAGNOSIS — I480 Paroxysmal atrial fibrillation: Secondary | ICD-10-CM | POA: Diagnosis not present

## 2020-09-08 DIAGNOSIS — E1122 Type 2 diabetes mellitus with diabetic chronic kidney disease: Secondary | ICD-10-CM | POA: Diagnosis not present

## 2020-09-08 DIAGNOSIS — I25119 Atherosclerotic heart disease of native coronary artery with unspecified angina pectoris: Secondary | ICD-10-CM | POA: Diagnosis not present

## 2020-09-08 DIAGNOSIS — I4891 Unspecified atrial fibrillation: Secondary | ICD-10-CM | POA: Diagnosis not present

## 2020-09-08 DIAGNOSIS — E039 Hypothyroidism, unspecified: Secondary | ICD-10-CM | POA: Diagnosis not present

## 2020-09-08 DIAGNOSIS — E782 Mixed hyperlipidemia: Secondary | ICD-10-CM | POA: Diagnosis not present

## 2020-09-08 NOTE — Progress Notes (Signed)
Carelink Summary Report / Loop Recorder 

## 2020-09-16 DIAGNOSIS — N183 Chronic kidney disease, stage 3 unspecified: Secondary | ICD-10-CM | POA: Diagnosis not present

## 2020-09-16 DIAGNOSIS — E1122 Type 2 diabetes mellitus with diabetic chronic kidney disease: Secondary | ICD-10-CM | POA: Diagnosis not present

## 2020-09-16 DIAGNOSIS — I480 Paroxysmal atrial fibrillation: Secondary | ICD-10-CM | POA: Diagnosis not present

## 2020-09-16 DIAGNOSIS — E1129 Type 2 diabetes mellitus with other diabetic kidney complication: Secondary | ICD-10-CM | POA: Diagnosis not present

## 2020-09-16 DIAGNOSIS — N2581 Secondary hyperparathyroidism of renal origin: Secondary | ICD-10-CM | POA: Diagnosis not present

## 2020-09-16 DIAGNOSIS — I129 Hypertensive chronic kidney disease with stage 1 through stage 4 chronic kidney disease, or unspecified chronic kidney disease: Secondary | ICD-10-CM | POA: Diagnosis not present

## 2020-09-16 DIAGNOSIS — I951 Orthostatic hypotension: Secondary | ICD-10-CM | POA: Diagnosis not present

## 2020-09-28 ENCOUNTER — Other Ambulatory Visit: Payer: Self-pay | Admitting: Gastroenterology

## 2020-09-28 DIAGNOSIS — R1310 Dysphagia, unspecified: Secondary | ICD-10-CM | POA: Diagnosis not present

## 2020-09-28 DIAGNOSIS — K921 Melena: Secondary | ICD-10-CM | POA: Diagnosis not present

## 2020-09-28 DIAGNOSIS — Z86010 Personal history of colonic polyps: Secondary | ICD-10-CM | POA: Diagnosis not present

## 2020-09-29 ENCOUNTER — Telehealth: Payer: Self-pay | Admitting: *Deleted

## 2020-09-29 DIAGNOSIS — I25119 Atherosclerotic heart disease of native coronary artery with unspecified angina pectoris: Secondary | ICD-10-CM | POA: Diagnosis not present

## 2020-09-29 DIAGNOSIS — Z125 Encounter for screening for malignant neoplasm of prostate: Secondary | ICD-10-CM | POA: Diagnosis not present

## 2020-09-29 DIAGNOSIS — I10 Essential (primary) hypertension: Secondary | ICD-10-CM | POA: Diagnosis not present

## 2020-09-29 DIAGNOSIS — I480 Paroxysmal atrial fibrillation: Secondary | ICD-10-CM | POA: Diagnosis not present

## 2020-09-29 DIAGNOSIS — E1122 Type 2 diabetes mellitus with diabetic chronic kidney disease: Secondary | ICD-10-CM | POA: Diagnosis not present

## 2020-09-29 DIAGNOSIS — N183 Chronic kidney disease, stage 3 unspecified: Secondary | ICD-10-CM | POA: Diagnosis not present

## 2020-09-29 DIAGNOSIS — E039 Hypothyroidism, unspecified: Secondary | ICD-10-CM | POA: Diagnosis not present

## 2020-09-29 DIAGNOSIS — E7800 Pure hypercholesterolemia, unspecified: Secondary | ICD-10-CM | POA: Diagnosis not present

## 2020-09-29 DIAGNOSIS — D6869 Other thrombophilia: Secondary | ICD-10-CM | POA: Diagnosis not present

## 2020-09-29 NOTE — Telephone Encounter (Signed)
Pharmacy, can you please comment on how long Xarelto can be held for upcoming procedure?  Thank you! 

## 2020-09-29 NOTE — Telephone Encounter (Signed)
Left message to call back and ask to speak with pre-op team.  Darreld Mclean, PA-C 09/29/2020 3:28 PM

## 2020-09-29 NOTE — Telephone Encounter (Signed)
   Patient Name: [name]  DOB: December 04, 1950  MRN: [id number]   Primary Cardiologist: Sinclair Grooms, MD  Chart reviewed as part of pre-operative protocol coverage. Patient was last seen by Dr. [name] on 07/27/2020. Patient was contacted today for further pre-op evaluation. He is overall stable from a cardiac standpoint. He notes intermittent episodes of decreased energy which he attributes to atrial fibrillation. However, no palpitations, chest pain, shortness of breath, near syncope/syncope, or acute CHF symptoms. Able to complete >4.0 METS without any anginal symptoms. Given past medical history and time since last visit, based on ACC/AHA guidelines, [name] would be at acceptable risk for the planned procedure without further cardiovascular testing.   Per Pharmacy, patient can hold Xarelto for 2 days prior to procedure. This should be restarted as soon as able following procedure.   The patient was advised that if he develops new symptoms prior to surgery to contact our office to arrange for a follow-up visit, and he verbalized understanding.  I will route this recommendation to the requesting party via Epic fax function and remove from pre-op pool.  Please call with questions.  Darreld Mclean, PA-C 09/29/2020, 4:03 PM

## 2020-09-29 NOTE — Telephone Encounter (Signed)
Patient with diagnosis of afib on Xarelto for anticoagulation.    Procedure: colonoscopy/endoscopy Date of procedure: 12/01/20  CHA2DS2-VASc Score = 4  This indicates a 4.8% annual risk of stroke. The patient's score is based upon: CHF History: No HTN History: Yes Diabetes History: Yes Stroke History: No Vascular Disease History: Yes Age Score: 1 Gender Score: 0  CrCl 12mL/min using adjusted body weight Platelet count 171K  Per office protocol, patient can hold Xarelto for 2 days prior to procedure.

## 2020-09-29 NOTE — Telephone Encounter (Signed)
   [facility] Pre-operative Risk Assessment    Patient Name: [name]  DOB: 07-05-1950  MRN: [id number]   HEARTCARE STAFF: - Please ensure there is not already an duplicate clearance open for this procedure. - Under Visit Info/Reason for Call, type in Other and utilize the format Clearance MM/DD/YY or Clearance TBD. Do not use dashes or single digits. - If request is for dental extraction, please clarify the # of teeth to be extracted.  Request for surgical clearance:  1. What type of surgery is being performed? CALLED THE OFFICE AND CONFIRMED PROCEDURE IS FOR COLONOSCOPY AND ENDOSCOPY   2. When is this surgery scheduled? 12/01/20   3. What type of clearance is required (medical clearance vs. Pharmacy clearance to hold med vs. Both)? BOTH  4. Are there any medications that need to be held prior to surgery and how long? [place]   5. Practice name and name of physician performing surgery? [facility]; DR. MAGOD   6. What is the office phone number? [phone]   7.   What is the office fax number? [phone]  8.   Anesthesia type (None, local, MAC, general) ? PROPOFOL   [name] 09/29/2020, 11:08 AM  _________________________________________________________________   (provider comments below)

## 2020-10-02 LAB — CUP PACEART REMOTE DEVICE CHECK
Date Time Interrogation Session: 20220402001923
Implantable Pulse Generator Implant Date: 20190924

## 2020-10-04 ENCOUNTER — Ambulatory Visit (INDEPENDENT_AMBULATORY_CARE_PROVIDER_SITE_OTHER): Payer: Medicare Other

## 2020-10-04 DIAGNOSIS — I4819 Other persistent atrial fibrillation: Secondary | ICD-10-CM | POA: Diagnosis not present

## 2020-10-04 DIAGNOSIS — K921 Melena: Secondary | ICD-10-CM | POA: Diagnosis not present

## 2020-10-07 DIAGNOSIS — E1122 Type 2 diabetes mellitus with diabetic chronic kidney disease: Secondary | ICD-10-CM | POA: Diagnosis not present

## 2020-10-07 DIAGNOSIS — N183 Chronic kidney disease, stage 3 unspecified: Secondary | ICD-10-CM | POA: Diagnosis not present

## 2020-10-07 DIAGNOSIS — E782 Mixed hyperlipidemia: Secondary | ICD-10-CM | POA: Diagnosis not present

## 2020-10-07 DIAGNOSIS — I25119 Atherosclerotic heart disease of native coronary artery with unspecified angina pectoris: Secondary | ICD-10-CM | POA: Diagnosis not present

## 2020-10-07 DIAGNOSIS — I10 Essential (primary) hypertension: Secondary | ICD-10-CM | POA: Diagnosis not present

## 2020-10-07 DIAGNOSIS — E039 Hypothyroidism, unspecified: Secondary | ICD-10-CM | POA: Diagnosis not present

## 2020-10-07 DIAGNOSIS — I480 Paroxysmal atrial fibrillation: Secondary | ICD-10-CM | POA: Diagnosis not present

## 2020-10-12 DIAGNOSIS — H1849 Other corneal degeneration: Secondary | ICD-10-CM | POA: Diagnosis not present

## 2020-10-12 DIAGNOSIS — I10 Essential (primary) hypertension: Secondary | ICD-10-CM | POA: Diagnosis not present

## 2020-10-12 DIAGNOSIS — H35039 Hypertensive retinopathy, unspecified eye: Secondary | ICD-10-CM | POA: Diagnosis not present

## 2020-10-12 DIAGNOSIS — E113293 Type 2 diabetes mellitus with mild nonproliferative diabetic retinopathy without macular edema, bilateral: Secondary | ICD-10-CM | POA: Diagnosis not present

## 2020-10-13 DIAGNOSIS — Z23 Encounter for immunization: Secondary | ICD-10-CM | POA: Diagnosis not present

## 2020-10-19 NOTE — Progress Notes (Signed)
Carelink Summary Report / Loop Recorder 

## 2020-10-27 ENCOUNTER — Telehealth: Payer: Self-pay | Admitting: Interventional Cardiology

## 2020-10-27 MED ORDER — RIVAROXABAN 20 MG PO TABS: ORAL_TABLET | ORAL | 5 refills | Status: AC

## 2020-10-27 NOTE — Telephone Encounter (Signed)
*  STAT* If patient is at the pharmacy, call can be transferred to refill team.   1. Which medications need to be refilled? (please list name of each medication and dose if known) Xarelto  2. Which pharmacy/location (including street and city if local pharmacy) is medication to be sent to? [facility]  3. Do they need a 30 day or 90 day supply? [place]

## 2020-10-27 NOTE — Telephone Encounter (Addendum)
Pt last saw Dr [name] 07/27/20, last labs 09/28/20 Creat 1.66 at [facility] per [facility], age 70, weight 113.2kg, CrCl 67.25, based on CrCl pt is on appropriate dosage of Xarelto 20mg  QD.

## 2020-11-03 DIAGNOSIS — I10 Essential (primary) hypertension: Secondary | ICD-10-CM | POA: Diagnosis not present

## 2020-11-03 DIAGNOSIS — E559 Vitamin D deficiency, unspecified: Secondary | ICD-10-CM | POA: Diagnosis not present

## 2020-11-03 DIAGNOSIS — E039 Hypothyroidism, unspecified: Secondary | ICD-10-CM | POA: Diagnosis not present

## 2020-11-08 ENCOUNTER — Ambulatory Visit (INDEPENDENT_AMBULATORY_CARE_PROVIDER_SITE_OTHER): Payer: Medicare Other

## 2020-11-08 DIAGNOSIS — I4819 Other persistent atrial fibrillation: Secondary | ICD-10-CM | POA: Diagnosis not present

## 2020-11-09 LAB — CUP PACEART REMOTE DEVICE CHECK
Date Time Interrogation Session: 20220514231153
Implantable Pulse Generator Implant Date: 20190924

## 2020-11-10 DIAGNOSIS — E1122 Type 2 diabetes mellitus with diabetic chronic kidney disease: Secondary | ICD-10-CM | POA: Diagnosis not present

## 2020-11-10 DIAGNOSIS — I4891 Unspecified atrial fibrillation: Secondary | ICD-10-CM | POA: Diagnosis not present

## 2020-11-10 DIAGNOSIS — I10 Essential (primary) hypertension: Secondary | ICD-10-CM | POA: Diagnosis not present

## 2020-11-10 DIAGNOSIS — E559 Vitamin D deficiency, unspecified: Secondary | ICD-10-CM | POA: Diagnosis not present

## 2020-11-10 DIAGNOSIS — I480 Paroxysmal atrial fibrillation: Secondary | ICD-10-CM | POA: Diagnosis not present

## 2020-11-10 DIAGNOSIS — I25119 Atherosclerotic heart disease of native coronary artery with unspecified angina pectoris: Secondary | ICD-10-CM | POA: Diagnosis not present

## 2020-11-10 DIAGNOSIS — Z85819 Personal history of malignant neoplasm of unspecified site of lip, oral cavity, and pharynx: Secondary | ICD-10-CM | POA: Diagnosis not present

## 2020-11-10 DIAGNOSIS — N183 Chronic kidney disease, stage 3 unspecified: Secondary | ICD-10-CM | POA: Diagnosis not present

## 2020-11-10 DIAGNOSIS — E7800 Pure hypercholesterolemia, unspecified: Secondary | ICD-10-CM | POA: Diagnosis not present

## 2020-11-10 DIAGNOSIS — R7303 Prediabetes: Secondary | ICD-10-CM | POA: Diagnosis not present

## 2020-11-10 DIAGNOSIS — Z8669 Personal history of other diseases of the nervous system and sense organs: Secondary | ICD-10-CM | POA: Diagnosis not present

## 2020-11-10 DIAGNOSIS — Z6830 Body mass index (BMI) 30.0-30.9, adult: Secondary | ICD-10-CM | POA: Diagnosis not present

## 2020-11-10 DIAGNOSIS — E782 Mixed hyperlipidemia: Secondary | ICD-10-CM | POA: Diagnosis not present

## 2020-11-10 DIAGNOSIS — E039 Hypothyroidism, unspecified: Secondary | ICD-10-CM | POA: Diagnosis not present

## 2020-11-17 ENCOUNTER — Telehealth: Payer: Self-pay | Admitting: Interventional Cardiology

## 2020-11-17 DIAGNOSIS — S51012A Laceration without foreign body of left elbow, initial encounter: Secondary | ICD-10-CM | POA: Diagnosis not present

## 2020-11-17 DIAGNOSIS — M25512 Pain in left shoulder: Secondary | ICD-10-CM | POA: Diagnosis not present

## 2020-11-17 NOTE — Telephone Encounter (Signed)
Pt reports restless night last night.  He awoke this morning with things knocked over and his arm scuffed up.  He also mentions a brick that he uses for something in his room was on the other side of the room this morning and he has no clue how that happened. He is calling wondering if he is having a stroke. Denies facial drooping, visual disturbances/changes, numbness, confusion, trouble speaking/understanding, dizziness/balance issues. Advised if any of those symptoms occur to call 911. Pt advised to call PCP to discuss further. Patient verbalized understanding and agreeable to plan.

## 2020-11-17 NOTE — Telephone Encounter (Signed)
New message:    Patient calling stating that when he woke up his left arm is hurting and he can barley move it. Please advise.

## 2020-11-19 ENCOUNTER — Ambulatory Visit: Payer: Medicare Other | Admitting: Podiatry

## 2020-11-29 ENCOUNTER — Inpatient Hospital Stay (HOSPITAL_COMMUNITY): Payer: Medicare Other

## 2020-11-29 ENCOUNTER — Inpatient Hospital Stay (HOSPITAL_COMMUNITY)
Admission: EM | Admit: 2020-11-29 | Discharge: 2020-12-24 | DRG: 064 | Disposition: E | Payer: Medicare Other | Attending: Pulmonary Disease | Admitting: Pulmonary Disease

## 2020-11-29 ENCOUNTER — Emergency Department (HOSPITAL_COMMUNITY): Payer: Medicare Other

## 2020-11-29 DIAGNOSIS — I4819 Other persistent atrial fibrillation: Secondary | ICD-10-CM | POA: Diagnosis present

## 2020-11-29 DIAGNOSIS — I468 Cardiac arrest due to other underlying condition: Secondary | ICD-10-CM | POA: Diagnosis not present

## 2020-11-29 DIAGNOSIS — Z4682 Encounter for fitting and adjustment of non-vascular catheter: Secondary | ICD-10-CM | POA: Diagnosis not present

## 2020-11-29 DIAGNOSIS — Z8249 Family history of ischemic heart disease and other diseases of the circulatory system: Secondary | ICD-10-CM | POA: Diagnosis not present

## 2020-11-29 DIAGNOSIS — J9601 Acute respiratory failure with hypoxia: Secondary | ICD-10-CM | POA: Diagnosis present

## 2020-11-29 DIAGNOSIS — J9811 Atelectasis: Secondary | ICD-10-CM | POA: Diagnosis not present

## 2020-11-29 DIAGNOSIS — G9382 Brain death: Secondary | ICD-10-CM | POA: Diagnosis not present

## 2020-11-29 DIAGNOSIS — I9589 Other hypotension: Secondary | ICD-10-CM | POA: Diagnosis present

## 2020-11-29 DIAGNOSIS — I618 Other nontraumatic intracerebral hemorrhage: Secondary | ICD-10-CM | POA: Diagnosis present

## 2020-11-29 DIAGNOSIS — G935 Compression of brain: Secondary | ICD-10-CM | POA: Diagnosis present

## 2020-11-29 DIAGNOSIS — Z923 Personal history of irradiation: Secondary | ICD-10-CM | POA: Diagnosis not present

## 2020-11-29 DIAGNOSIS — Z526 Liver donor: Secondary | ICD-10-CM

## 2020-11-29 DIAGNOSIS — S0003XA Contusion of scalp, initial encounter: Secondary | ICD-10-CM | POA: Diagnosis not present

## 2020-11-29 DIAGNOSIS — J690 Pneumonitis due to inhalation of food and vomit: Secondary | ICD-10-CM | POA: Diagnosis present

## 2020-11-29 DIAGNOSIS — Z7901 Long term (current) use of anticoagulants: Secondary | ICD-10-CM | POA: Diagnosis not present

## 2020-11-29 DIAGNOSIS — I10 Essential (primary) hypertension: Secondary | ICD-10-CM | POA: Diagnosis present

## 2020-11-29 DIAGNOSIS — I2510 Atherosclerotic heart disease of native coronary artery without angina pectoris: Secondary | ICD-10-CM | POA: Diagnosis present

## 2020-11-29 DIAGNOSIS — R29731 NIHSS score 31: Secondary | ICD-10-CM | POA: Diagnosis present

## 2020-11-29 DIAGNOSIS — S066X0A Traumatic subarachnoid hemorrhage without loss of consciousness, initial encounter: Secondary | ICD-10-CM | POA: Diagnosis not present

## 2020-11-29 DIAGNOSIS — Z7984 Long term (current) use of oral hypoglycemic drugs: Secondary | ICD-10-CM

## 2020-11-29 DIAGNOSIS — G936 Cerebral edema: Secondary | ICD-10-CM | POA: Diagnosis present

## 2020-11-29 DIAGNOSIS — I619 Nontraumatic intracerebral hemorrhage, unspecified: Secondary | ICD-10-CM | POA: Diagnosis not present

## 2020-11-29 DIAGNOSIS — S2220XA Unspecified fracture of sternum, initial encounter for closed fracture: Secondary | ICD-10-CM | POA: Diagnosis not present

## 2020-11-29 DIAGNOSIS — R4182 Altered mental status, unspecified: Secondary | ICD-10-CM | POA: Diagnosis present

## 2020-11-29 DIAGNOSIS — K219 Gastro-esophageal reflux disease without esophagitis: Secondary | ICD-10-CM | POA: Diagnosis present

## 2020-11-29 DIAGNOSIS — Z944 Liver transplant status: Secondary | ICD-10-CM | POA: Diagnosis not present

## 2020-11-29 DIAGNOSIS — Z79899 Other long term (current) drug therapy: Secondary | ICD-10-CM

## 2020-11-29 DIAGNOSIS — E1165 Type 2 diabetes mellitus with hyperglycemia: Secondary | ICD-10-CM | POA: Diagnosis present

## 2020-11-29 DIAGNOSIS — Z20822 Contact with and (suspected) exposure to covid-19: Secondary | ICD-10-CM | POA: Diagnosis present

## 2020-11-29 DIAGNOSIS — E039 Hypothyroidism, unspecified: Secondary | ICD-10-CM | POA: Diagnosis present

## 2020-11-29 DIAGNOSIS — R40243 Glasgow coma scale score 3-8, unspecified time: Secondary | ICD-10-CM

## 2020-11-29 DIAGNOSIS — J9600 Acute respiratory failure, unspecified whether with hypoxia or hypercapnia: Secondary | ICD-10-CM | POA: Diagnosis not present

## 2020-11-29 DIAGNOSIS — Z803 Family history of malignant neoplasm of breast: Secondary | ICD-10-CM | POA: Diagnosis not present

## 2020-11-29 DIAGNOSIS — J9690 Respiratory failure, unspecified, unspecified whether with hypoxia or hypercapnia: Secondary | ICD-10-CM | POA: Diagnosis not present

## 2020-11-29 DIAGNOSIS — M47812 Spondylosis without myelopathy or radiculopathy, cervical region: Secondary | ICD-10-CM | POA: Diagnosis not present

## 2020-11-29 DIAGNOSIS — N179 Acute kidney failure, unspecified: Secondary | ICD-10-CM

## 2020-11-29 DIAGNOSIS — J90 Pleural effusion, not elsewhere classified: Secondary | ICD-10-CM | POA: Diagnosis not present

## 2020-11-29 DIAGNOSIS — G4733 Obstructive sleep apnea (adult) (pediatric): Secondary | ICD-10-CM | POA: Diagnosis not present

## 2020-11-29 DIAGNOSIS — J384 Edema of larynx: Secondary | ICD-10-CM | POA: Diagnosis present

## 2020-11-29 DIAGNOSIS — R911 Solitary pulmonary nodule: Secondary | ICD-10-CM | POA: Diagnosis not present

## 2020-11-29 DIAGNOSIS — Z529 Donor of unspecified organ or tissue: Secondary | ICD-10-CM

## 2020-11-29 DIAGNOSIS — Z09 Encounter for follow-up examination after completed treatment for conditions other than malignant neoplasm: Secondary | ICD-10-CM

## 2020-11-29 DIAGNOSIS — Z79890 Hormone replacement therapy (postmenopausal): Secondary | ICD-10-CM

## 2020-11-29 DIAGNOSIS — Z85818 Personal history of malignant neoplasm of other sites of lip, oral cavity, and pharynx: Secondary | ICD-10-CM

## 2020-11-29 DIAGNOSIS — I480 Paroxysmal atrial fibrillation: Secondary | ICD-10-CM | POA: Diagnosis not present

## 2020-11-29 DIAGNOSIS — I517 Cardiomegaly: Secondary | ICD-10-CM | POA: Diagnosis not present

## 2020-11-29 DIAGNOSIS — Z515 Encounter for palliative care: Secondary | ICD-10-CM

## 2020-11-29 DIAGNOSIS — Z981 Arthrodesis status: Secondary | ICD-10-CM | POA: Diagnosis not present

## 2020-11-29 DIAGNOSIS — K8020 Calculus of gallbladder without cholecystitis without obstruction: Secondary | ICD-10-CM | POA: Diagnosis not present

## 2020-11-29 DIAGNOSIS — Z66 Do not resuscitate: Secondary | ICD-10-CM | POA: Diagnosis not present

## 2020-11-29 DIAGNOSIS — G9340 Encephalopathy, unspecified: Secondary | ICD-10-CM | POA: Diagnosis present

## 2020-11-29 DIAGNOSIS — I491 Atrial premature depolarization: Secondary | ICD-10-CM | POA: Diagnosis not present

## 2020-11-29 DIAGNOSIS — R000 Tachycardia, unspecified: Secondary | ICD-10-CM | POA: Diagnosis not present

## 2020-11-29 DIAGNOSIS — E785 Hyperlipidemia, unspecified: Secondary | ICD-10-CM | POA: Diagnosis present

## 2020-11-29 DIAGNOSIS — R4020 Unspecified coma: Secondary | ICD-10-CM | POA: Diagnosis not present

## 2020-11-29 DIAGNOSIS — R578 Other shock: Secondary | ICD-10-CM | POA: Diagnosis present

## 2020-11-29 DIAGNOSIS — R404 Transient alteration of awareness: Secondary | ICD-10-CM | POA: Diagnosis not present

## 2020-11-29 DIAGNOSIS — Z9221 Personal history of antineoplastic chemotherapy: Secondary | ICD-10-CM

## 2020-11-29 DIAGNOSIS — N281 Cyst of kidney, acquired: Secondary | ICD-10-CM | POA: Diagnosis not present

## 2020-11-29 LAB — COMPREHENSIVE METABOLIC PANEL
ALT: 14 U/L (ref 0–44)
AST: 27 U/L (ref 15–41)
Albumin: 3.8 g/dL (ref 3.5–5.0)
Alkaline Phosphatase: 143 U/L — ABNORMAL HIGH (ref 38–126)
Anion gap: 14 (ref 5–15)
BUN: 39 mg/dL — ABNORMAL HIGH (ref 8–23)
CO2: 22 mmol/L (ref 22–32)
Calcium: 9.3 mg/dL (ref 8.9–10.3)
Chloride: 100 mmol/L (ref 98–111)
Creatinine, Ser: 1.69 mg/dL — ABNORMAL HIGH (ref 0.61–1.24)
GFR, Estimated: 43 mL/min — ABNORMAL LOW (ref >60)
Glucose, Bld: 335 mg/dL — ABNORMAL HIGH (ref 70–99)
Potassium: 4.9 mmol/L (ref 3.5–5.1)
Sodium: 136 mmol/L (ref 135–145)
Total Bilirubin: 0.8 mg/dL (ref 0.3–1.2)
Total Protein: 6.7 g/dL (ref 6.5–8.1)

## 2020-11-29 LAB — CBC WITH DIFFERENTIAL/PLATELET
Abs Immature Granulocytes: 0.11 10*3/uL — ABNORMAL HIGH (ref 0.00–0.07)
Basophils Absolute: 0 10*3/uL (ref 0.0–0.1)
Basophils Relative: 0 %
Eosinophils Absolute: 0 10*3/uL (ref 0.0–0.5)
Eosinophils Relative: 0 %
HCT: 45.2 % (ref 39.0–52.0)
Hemoglobin: 15.3 g/dL (ref 13.0–17.0)
Immature Granulocytes: 1 %
Lymphocytes Relative: 2 %
Lymphs Abs: 0.4 10*3/uL — ABNORMAL LOW (ref 0.7–4.0)
MCH: 30.4 pg (ref 26.0–34.0)
MCHC: 33.8 g/dL (ref 30.0–36.0)
MCV: 89.9 fL (ref 80.0–100.0)
Monocytes Absolute: 1.6 10*3/uL — ABNORMAL HIGH (ref 0.1–1.0)
Monocytes Relative: 8 %
Neutro Abs: 18.9 10*3/uL — ABNORMAL HIGH (ref 1.7–7.7)
Neutrophils Relative %: 89 %
Platelets: 277 10*3/uL (ref 150–400)
RBC: 5.03 MIL/uL (ref 4.22–5.81)
RDW: 12.7 % (ref 11.5–15.5)
WBC: 21 10*3/uL — ABNORMAL HIGH (ref 4.0–10.5)
nRBC: 0 % (ref 0.0–0.2)

## 2020-11-29 LAB — I-STAT ARTERIAL BLOOD GAS, ED
Acid-Base Excess: 0 mmol/L (ref 0.0–2.0)
Bicarbonate: 23.6 mmol/L (ref 20.0–28.0)
Calcium, Ion: 1.16 mmol/L (ref 1.15–1.40)
HCT: 41 % (ref 39.0–52.0)
Hemoglobin: 13.9 g/dL (ref 13.0–17.0)
O2 Saturation: 100 %
Patient temperature: 98.4
Potassium: 4 mmol/L (ref 3.5–5.1)
Sodium: 138 mmol/L (ref 135–145)
TCO2: 25 mmol/L (ref 22–32)
pCO2 arterial: 33 mmHg (ref 32.0–48.0)
pH, Arterial: 7.461 — ABNORMAL HIGH (ref 7.350–7.450)
pO2, Arterial: 339 mmHg — ABNORMAL HIGH (ref 83.0–108.0)

## 2020-11-29 LAB — LACTIC ACID, PLASMA: Lactic Acid, Venous: 3.7 mmol/L (ref 0.5–1.9)

## 2020-11-29 LAB — RAPID URINE DRUG SCREEN, HOSP PERFORMED
Amphetamines: NOT DETECTED
Barbiturates: NOT DETECTED
Benzodiazepines: NOT DETECTED
Cocaine: NOT DETECTED
Opiates: NOT DETECTED
Tetrahydrocannabinol: NOT DETECTED

## 2020-11-29 LAB — I-STAT CHEM 8, ED
BUN: 38 mg/dL — ABNORMAL HIGH (ref 8–23)
Calcium, Ion: 1.15 mmol/L (ref 1.15–1.40)
Chloride: 104 mmol/L (ref 98–111)
Creatinine, Ser: 1.4 mg/dL — ABNORMAL HIGH (ref 0.61–1.24)
Glucose, Bld: 327 mg/dL — ABNORMAL HIGH (ref 70–99)
HCT: 46 % (ref 39.0–52.0)
Hemoglobin: 15.6 g/dL (ref 13.0–17.0)
Potassium: 4.1 mmol/L (ref 3.5–5.1)
Sodium: 139 mmol/L (ref 135–145)
TCO2: 22 mmol/L (ref 22–32)

## 2020-11-29 LAB — CBG MONITORING, ED: Glucose-Capillary: 295 mg/dL — ABNORMAL HIGH (ref 70–99)

## 2020-11-29 LAB — URINALYSIS, COMPLETE (UACMP) WITH MICROSCOPIC
Bilirubin Urine: NEGATIVE
Glucose, UA: >=500 mg/dL — AB
Ketones, ur: 5 mg/dL — AB
Leukocytes,Ua: NEGATIVE
Nitrite: NEGATIVE
Protein, ur: 100 mg/dL — AB
Specific Gravity, Urine: 1.021 (ref 1.005–1.030)
pH: 5 (ref 5.0–8.0)

## 2020-11-29 LAB — ETHANOL: Alcohol, Ethyl (B): <10 mg/dL (ref <10)

## 2020-11-29 LAB — APTT: aPTT: 31 seconds (ref 24–36)

## 2020-11-29 LAB — GLUCOSE, CAPILLARY
Glucose-Capillary: 253 mg/dL — ABNORMAL HIGH (ref 70–99)
Glucose-Capillary: 299 mg/dL — ABNORMAL HIGH (ref 70–99)

## 2020-11-29 LAB — HIV ANTIBODY (ROUTINE TESTING W REFLEX): HIV Screen 4th Generation wRfx: NONREACTIVE

## 2020-11-29 LAB — TSH: TSH: 0.477 u[IU]/mL (ref 0.350–4.500)

## 2020-11-29 LAB — CK: Total CK: 882 U/L — ABNORMAL HIGH (ref 49–397)

## 2020-11-29 MED ORDER — PANTOPRAZOLE SODIUM 40 MG IV SOLR
40.0000 mg | Freq: Every day | INTRAVENOUS | Status: DC
  Administered 2020-11-29: 40 mg via INTRAVENOUS
  Filled 2020-11-29: qty 40

## 2020-11-29 MED ORDER — POLYETHYLENE GLYCOL 3350 17 G PO PACK: 17.0000 g | PACK | Freq: Every day | ORAL | Status: DC

## 2020-11-29 MED ORDER — FENTANYL 2500MCG IN NS 250ML (10MCG/ML) PREMIX INFUSION
25.0000 ug/h | INTRAVENOUS | Status: DC
  Administered 2020-11-29: 25 ug/h via INTRAVENOUS
  Filled 2020-11-29: qty 250

## 2020-11-29 MED ORDER — NICARDIPINE HCL IN NACL 20-0.86 MG/200ML-% IV SOLN
3.0000 mg/h | INTRAVENOUS | Status: DC
  Filled 2020-11-29: qty 200

## 2020-11-29 MED ORDER — LACTATED RINGERS IV SOLN
INTRAVENOUS | Status: DC
  Administered 2020-11-30: 50 mL/h via INTRAVENOUS

## 2020-11-29 MED ORDER — CHLORHEXIDINE GLUCONATE CLOTH 2 % EX PADS
6.0000 | MEDICATED_PAD | Freq: Every day | CUTANEOUS | Status: DC
  Administered 2020-11-29 – 2020-12-01 (×3): 6 via TOPICAL

## 2020-11-29 MED ORDER — SODIUM CHLORIDE 0.9% FLUSH
10.0000 mL | Freq: Two times a day (BID) | INTRAVENOUS | Status: DC
  Administered 2020-11-29 – 2020-11-30 (×2): 10 mL

## 2020-11-29 MED ORDER — FENTANYL BOLUS VIA INFUSION
25.0000 ug | INTRAVENOUS | Status: DC | PRN
Start: 2020-11-29 — End: 2020-11-30
  Administered 2020-11-30: 50 ug via INTRAVENOUS
  Administered 2020-11-30 (×2): 25 ug via INTRAVENOUS
  Administered 2020-11-30: 50 ug via INTRAVENOUS
  Filled 2020-11-29: qty 100

## 2020-11-29 MED ORDER — FENTANYL CITRATE (PF) 100 MCG/2ML IJ SOLN: 50.0000 ug | INTRAMUSCULAR | Status: DC | PRN

## 2020-11-29 MED ORDER — PIPERACILLIN-TAZOBACTAM 3.375 G IVPB 30 MIN
3.3750 g | Freq: Once | INTRAVENOUS | Status: AC
  Administered 2020-11-29: 3.375 g via INTRAVENOUS
  Filled 2020-11-29: qty 50

## 2020-11-29 MED ORDER — LEVOTHYROXINE SODIUM 25 MCG PO TABS
125.0000 ug | ORAL_TABLET | Freq: Every day | ORAL | Status: DC
  Administered 2020-11-30: 125 ug
  Filled 2020-11-29: qty 1

## 2020-11-29 MED ORDER — VANCOMYCIN HCL 1500 MG/300ML IV SOLN: 1500.0000 mg | INTRAVENOUS | Status: DC

## 2020-11-29 MED ORDER — INSULIN ASPART 100 UNIT/ML IJ SOLN
0.0000 [IU] | INTRAMUSCULAR | Status: DC
  Administered 2020-11-29 (×2): 8 [IU] via SUBCUTANEOUS
  Administered 2020-11-30 (×2): 2 [IU] via SUBCUTANEOUS
  Administered 2020-11-30: 3 [IU] via SUBCUTANEOUS
  Administered 2020-11-30 – 2020-12-01 (×2): 8 [IU] via SUBCUTANEOUS
  Administered 2020-12-01: 3 [IU] via SUBCUTANEOUS
  Administered 2020-12-01: 11 [IU] via SUBCUTANEOUS
  Administered 2020-12-01: 5 [IU] via SUBCUTANEOUS
  Administered 2020-12-01: 3 [IU] via SUBCUTANEOUS

## 2020-11-29 MED ORDER — CHLORHEXIDINE GLUCONATE 0.12% ORAL RINSE (MEDLINE KIT)
15.0000 mL | Freq: Two times a day (BID) | OROMUCOSAL | Status: DC
  Administered 2020-11-29 – 2020-12-01 (×5): 15 mL via OROMUCOSAL

## 2020-11-29 MED ORDER — ORAL CARE MOUTH RINSE
15.0000 mL | OROMUCOSAL | Status: DC
  Administered 2020-11-29 – 2020-12-02 (×22): 15 mL via OROMUCOSAL

## 2020-11-29 MED ORDER — SODIUM CHLORIDE 3 % IV SOLN
INTRAVENOUS | Status: DC
  Administered 2020-11-30: 75 mL/h via INTRAVENOUS
  Filled 2020-11-29 (×4): qty 500

## 2020-11-29 MED ORDER — SODIUM CHLORIDE 0.9 % IV SOLN
INTRAVENOUS | Status: DC | PRN
  Administered 2020-11-29: 250 mL via INTRAVENOUS

## 2020-11-29 MED ORDER — PIPERACILLIN-TAZOBACTAM 3.375 G IVPB
3.3750 g | Freq: Three times a day (TID) | INTRAVENOUS | Status: DC
  Administered 2020-11-30 – 2020-12-01 (×6): 3.375 g via INTRAVENOUS
  Filled 2020-11-29 (×8): qty 50

## 2020-11-29 MED ORDER — PROPOFOL 1000 MG/100ML IV EMUL
5.0000 ug/kg/min | INTRAVENOUS | Status: DC
  Administered 2020-11-29: 5 ug/kg/min via INTRAVENOUS
  Administered 2020-11-30: 25 ug/kg/min via INTRAVENOUS
  Filled 2020-11-29 (×2): qty 100

## 2020-11-29 MED ORDER — ROCURONIUM BROMIDE 50 MG/5ML IV SOLN
INTRAVENOUS | Status: AC | PRN
  Administered 2020-11-29: 100 mg via INTRAVENOUS

## 2020-11-29 MED ORDER — DOCUSATE SODIUM 100 MG PO CAPS: 100.0000 mg | ORAL_CAPSULE | Freq: Two times a day (BID) | ORAL | Status: DC | PRN

## 2020-11-29 MED ORDER — SODIUM CHLORIDE 0.9% FLUSH
10.0000 mL | INTRAVENOUS | Status: DC | PRN
Start: 2020-11-29 — End: 2020-12-05

## 2020-11-29 MED ORDER — LEVETIRACETAM IN NACL 500 MG/100ML IV SOLN
500.0000 mg | Freq: Two times a day (BID) | INTRAVENOUS | Status: DC
  Administered 2020-11-29 – 2020-11-30 (×2): 500 mg via INTRAVENOUS
  Filled 2020-11-29 (×2): qty 100

## 2020-11-29 MED ORDER — FENTANYL CITRATE (PF) 100 MCG/2ML IJ SOLN
25.0000 ug | Freq: Once | INTRAMUSCULAR | Status: DC
Start: 2020-11-29 — End: 2020-11-30

## 2020-11-29 MED ORDER — HEPARIN SODIUM (PORCINE) 5000 UNIT/ML IJ SOLN
5000.0000 [IU] | Freq: Three times a day (TID) | INTRAMUSCULAR | Status: DC
Start: 2020-11-29 — End: 2020-11-29

## 2020-11-29 MED ORDER — LABETALOL HCL 5 MG/ML IV SOLN
10.0000 mg | Freq: Once | INTRAVENOUS | Status: AC
  Administered 2020-11-29: 10 mg via INTRAVENOUS
  Filled 2020-11-29: qty 4

## 2020-11-29 MED ORDER — EMPTY CONTAINERS FLEXIBLE MISC
1800.0000 mg | Freq: Once | Status: AC
  Administered 2020-11-29: 1800 mg via INTRAVENOUS
  Filled 2020-11-29: qty 180

## 2020-11-29 MED ORDER — VANCOMYCIN HCL 2000 MG/400ML IV SOLN
2000.0000 mg | Freq: Once | INTRAVENOUS | Status: AC
  Administered 2020-11-29: 2000 mg via INTRAVENOUS
  Filled 2020-11-29: qty 400

## 2020-11-29 MED ORDER — CLEVIDIPINE BUTYRATE 0.5 MG/ML IV EMUL
0.0000 mg/h | INTRAVENOUS | Status: DC
  Administered 2020-11-30: 1 mg/h via INTRAVENOUS
  Filled 2020-11-29 (×2): qty 50

## 2020-11-29 MED ORDER — POLYETHYLENE GLYCOL 3350 17 G PO PACK: 17.0000 g | PACK | Freq: Every day | ORAL | Status: DC | PRN

## 2020-11-29 MED ORDER — DOCUSATE SODIUM 50 MG/5ML PO LIQD
100.0000 mg | Freq: Two times a day (BID) | ORAL | Status: DC
  Administered 2020-11-29: 100 mg
  Filled 2020-11-29: qty 10

## 2020-11-29 MED ORDER — ETOMIDATE 2 MG/ML IV SOLN
INTRAVENOUS | Status: AC | PRN
  Administered 2020-11-29: 20 mg via INTRAVENOUS

## 2020-11-29 NOTE — ED Triage Notes (Signed)
Pt from [facility] via ems; found on floor unresponsive; LKW noon Saturday; pt noted to have dark emesis, secretions; pt suctioned and bagged by ems; pupils non reactive, rhonchi noted, PVCs on ekg; hx throat cancer, afib, dm; pt on blood thinner  cbg 380 146/62 Hr 94

## 2020-11-29 NOTE — Progress Notes (Signed)
Pt transported to ct then to 29m with no complications.

## 2020-11-29 NOTE — Progress Notes (Signed)
eLink Physician-Brief Progress Note Patient Name: KEYLER HOGE DOB: Nov 21, 1950 MRN: [id number]   Date of Service  12/14/2020  HPI/Events of Note  70 yr old male found down at home by his sister, last seen at 76 PM on 73 th. Hx of a fib, Stage 4 throat cancer in remission. Now in ICU on vent. For possible asp pneumonitis, encephalopathy. LP in AM.   Camera: Discussed with RN. VS stable. SBP 140, sats 99% on 650 ( VT < 8 ml/ibw).  On low dose propofol. Nothing needed at this time as per RN  Data: Reviewed  Got a call from radiology: CTH : large left ICB with shift, rt SAH, occipital infarcts.  CxR RUL aspiration or atelectatic collapse, on Vent.    eICU Interventions  - discussed with CCM team. Dr Smith/NP Shearon Stalls going to call Neurosurgery stat. Meantime I have ordered Andexanat IV for Xarelto reversal - on abx - on SCD - sz precautions. Will order keppra as precaution. - keep SBP < 140 - CBG goals < 180 - lung protective ventilation.       Intervention Category Major Interventions: Change in mental status - evaluation and management;Respiratory failure - evaluation and management Evaluation Type: New Patient Evaluation  Elmer Sow 12/10/2020, 9:24 PM

## 2020-11-29 NOTE — ED Notes (Signed)
Report called to [facility], RN.

## 2020-11-29 NOTE — Progress Notes (Signed)
CT head with extremely large intraparenchymal hemorrhage with IVH and midline shift.  Stat page to [place].  Adnexxa ordered by E link doc.  [name] MD PCCM

## 2020-11-29 NOTE — H&P (Signed)
NAME:  [name], MRN:  [id number], DOB:  08/01/1950, LOS: 0 ADMISSION DATE:  12/16/2020, CONSULTATION DATE:  12/19/2020 REFERRING MD:  Almyra Free CHIEF COMPLAINT:  AMS   History of Present Illness:  [name] is a 70 y.o. male who has PMH as outlined below.  He presented to [facility] ED 6/6 after being found down in his home by his sister. Sister states she communicates with him almost daily and last communication was Saturday 6/4.  At the time, he had told her that he was feeling fine and somewhat better (hadn't been feeling well for a few days prior to that).  Sister states she texted him at 0800on day of presentation 6/6 and when she still did not receive a response by 1700, she went to his home and that's when she found him down.  He had a shirt on but no pants.  His head was somewhat under a table and he had some blankets near by.  Sister unsure what had happened. She did talk to neighbors who stated that they saw pt 1 day prior to arrival on 6/5 walking to mailbox and then later that afternoon also get out of his car and walk into his house.  He had not acted out of the ordinary at the time.  In the ED, he had minimally responsive pupils and remained unresponsive.  He was subsequently intubated.  He had dried blood about the nares and mouth but no obvious bleeding.  CT head is pending. ISTAT labs only noteable for mild AKI and hyperglycemia.  Pertinent  Medical History:  has Paroxysmal atrial fibrillation ([place]); Essential hypertension; Uncomplicated type 2 diabetes mellitus ([place]); Obstructive sleep apnea; Pharyngeal cancer ([place]); Hyperlipidemia; Hypothyroidism; Persistent atrial fibrillation ([place]); Orthostatic hypotension; Pressure injury of skin; Secondary hypercoagulable state ([place]); ILD (interstitial lung disease) ([place]); Bronchiectasis without complication ([place]); and Altered mental status on their problem list.  Significant Hospital Events: Including procedures, antibiotic start and stop dates in  addition to other pertinent events   6/6 > admit.  Micro Data: Blood 6/6 >  Sputum 6/6 >  Flu 6/6 >  SARS CoV2 6/6 >   Antibiotics: Vanc 6/6 >  Zosyn 6/6 >   Interim History / Subjective:  Unresponsive on vent.  On 5 mcg/kg/min Propofol.  Objective:  Blood pressure (!) 171/99, pulse 85, temperature 98.4 F (36.9 C), resp. rate 18, height 6\' 4"  (1.93 m), weight 113.2 kg, SpO2 100 %.    Vent Mode: PRVC FiO2 (%):  [100 %] 100 % Set Rate:  [18 bmp] 18 bmp Vt Set:  [540 mL-690 mL] 690 mL PEEP:  [5 cmH20] 5 cmH20 Plateau Pressure:  [14 cmH20-19 cmH20] 19 cmH20  No intake or output data in the 24 hours ending 12/19/2020 2028 Filed Weights   11/28/2020 1841  Weight: 113.2 kg    Examination: General: Adult male, critically ill. Neuro: Sedated, not responsive. Small hematoma to right temporal lobe. HEENT: Dried blood about nares, no active bleeding. Sclerae anicteric. Pupils sluggish. Cardiovascular: RRR, no M/R/G.  Lungs: Respirations even and unlabored.  CTA bilaterally, No W/R/R. Abdomen: BS x 4, soft, NT/ND.  Musculoskeletal: No gross deformities, no edema.  Skin: Intact, warm, no rashes.  Labs/imaging personally reviewed:  CT head 6/6 >  CXR 6/6 > atelectasis, mild edema, possible RUL infiltrate.  Assessment & Plan:   Acute hypoxic respiratory failure - 2/2 AMS.  S/p intubation in ED. - Full vent support. - Wean as mental status allows. - Bronchial  hygiene. - Follow CXR.  Concern for PNA - CXR with possible RUL infiltrate and labs noteable for leukocytosis with neutrophil predominance. - Empiric Vanc / Zosyn for now. - Follow cultures. - Check PCT, low threshold to stop abx if low.  Acute encephalopathy - unclear etiology.  Downtime unknown, last seen his normal state roughly ~28 hours prior. - STAT CT head pending. - F/u all labs including UDS. - Avoid sedating meds. - Hold anticoagulation until head CT back.  AKI. - Gentle hydration. - Follow BMP.  Hx PAF  (on Xarelto), HTN, HLD, CAD. - Hold home Xarelto. - If head CT negative, can consider heparin gtt for now. - Hold home Pravastatin, [name], Amlodipine.  Hx DM. - SSI. - Hold home Glmepiride.  Hx hypothyroidism. - Continue home Synthroid. - Assess TSH.  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 - No interventions required.    Best practice (evaluated daily):  Diet:  NPO Pain/Anxiety/Delirium protocol (if indicated): Yes (RASS goal -1) VAP protocol (if indicated): Yes DVT prophylaxis: Contraindicated - on hold until CT head back. GI prophylaxis: PPI Glucose control:  SSI Yes Central venous access:  N/A Arterial line:  N/A Foley:  Yes, and it is still needed Mobility:  bed rest  PT consulted: N/A Last date of multidisciplinary goals of care discussion: None yet.  Spoke with sister Mia over the phone. Code Status:  full code Disposition: ICU  Labs   CBC: Recent Labs  Lab 12/14/2020 1900 12/17/2020 1951  WBC 21.0*  --   NEUTROABS 18.9*  --   HGB 15.3 15.6  HCT 45.2 46.0  MCV 89.9  --   PLT 277  --     Basic Metabolic Panel: Recent Labs  Lab 11/25/2020 1951  NA 139  K 4.1  CL 104  GLUCOSE 327*  BUN 38*  CREATININE 1.40*   GFR: Estimated Creatinine Clearance: 68.6 mL/min (A) (by C-G formula based on SCr of 1.4 mg/dL (H)). Recent Labs  Lab 12/05/2020 1900  WBC 21.0*    Liver Function Tests: No results for input(s): AST, ALT, ALKPHOS, BILITOT, PROT, ALBUMIN in the last 168 hours. No results for input(s): LIPASE, AMYLASE in the last 168 hours. No results for input(s): AMMONIA in the last 168 hours.  ABG    Component Value Date/Time   TCO2 22 12/03/2020 1951     Coagulation Profile: No results for input(s): INR, PROTIME in the last 168 hours.  Cardiac Enzymes: No results for input(s): CKTOTAL, CKMB, CKMBINDEX, TROPONINI in the last 168 hours.  HbA1C: No results found for: HGBA1C  CBG: Recent Labs  Lab 12/16/2020 1911  GLUCAP 295*     Review of Systems:   Unable to obtain as pt is encephalopathic.  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   Surgical History:   Past Surgical History:  Procedure Laterality Date  . ATRIAL FIBRILLATION ABLATION N/A 06/06/2019   Procedure: ATRIAL FIBRILLATION ABLATION;  Surgeon: Thompson Grayer, MD;  Location: [facility];  Service: Cardiovascular;  Laterality: N/A;  . BREAST SURGERY    . CARDIAC CATHETERIZATION    .  CARDIOVERSION N/A 10/14/2015   Procedure: CARDIOVERSION;  Surgeon: Lelon Perla, MD;  Location: [facility] ENDOSCOPY;  Service: Cardiovascular;  Laterality: N/A;  . COLONOSCOPY WITH PROPOFOL N/A 06/03/2015   Procedure: COLONOSCOPY WITH PROPOFOL;  Surgeon: Laurence Spates, MD;  Location: [facility] ENDOSCOPY;  Service: Endoscopy;  Laterality: N/A;  . COLONOSCOPY WITH PROPOFOL N/A 06/12/2018   Procedure: COLONOSCOPY WITH PROPOFOL;  Surgeon: Laurence Spates, MD;  Location: [facility] ENDOSCOPY;  Service: Endoscopy;  Laterality: N/A;  . HOT HEMOSTASIS N/A 06/03/2015   Procedure: HOT HEMOSTASIS (ARGON PLASMA COAGULATION/BICAP);  Surgeon: Laurence Spates, MD;  Location: [facility] ENDOSCOPY;  Service: Endoscopy;  Laterality: N/A;  . LOOP RECORDER INSERTION N/A 03/19/2018   Procedure: LOOP RECORDER INSERTION;  Surgeon: Thompson Grayer, MD;  Location: [facility];  Service: Cardiovascular;  Laterality: N/A;     Social History:   reports that  he has never smoked. He has never used smokeless tobacco. He reports that he does not drink alcohol and does not use drugs.   Family History:  His family history includes Breast cancer in his mother; Coronary artery disease in his father.   Allergies Allergies  Allergen Reactions  . Nsaids Other (See Comments)    REACTION: PER MD REQUEST NOT TO TAKE  . Amlodipine Besylate     Other reaction(s): sluggish     Home Medications  Prior to Admission medications   Medication Sig Start Date End Date Taking? Authorizing Provider  acetaminophen (TYLENOL) 500 MG tablet Take 500 mg by mouth 2 (two) times daily.    [provider]  amLODipine (NORVASC) 2.5 MG tablet Take 2.5 mg by mouth daily.    [provider]  desonide (DESOWEN) 0.05 % cream Apply 1 application topically 2 (two) times daily as needed (dry skin). Behind ears.    [provider]  glimepiride (AMARYL) 1 MG tablet Take 1 mg by mouth daily with breakfast.    [provider]  levothyroxine (SYNTHROID) 125 MCG tablet Take 125 mcg by mouth daily before breakfast. 09/30/19   [provider]  loratadine (CLARITIN) 10 MG tablet Take 10 mg by mouth daily.     [provider]  Omega-3 Fatty Acids (FISH OIL) 1000 MG CAPS Take 2,000 mg by mouth daily. With Vit D    [provider]  pravastatin (PRAVACHOL) 80 MG tablet Take 80 mg by mouth at bedtime.     [provider]  quinapril (ACCUPRIL) 20 MG tablet Take 1 tablet (20 mg total) by mouth daily. 03/03/20   Belva Crome, MD  rivaroxaban (XARELTO) 20 MG TABS tablet TAKE ONE TABLET BY MOUTH DAILY WITH SUPPER Patient taking differently: Take 20 mg by mouth daily with supper. TAKE ONE TABLET BY MOUTH DAILY WITH SUPPER 10/27/20   Belva Crome, MD     Critical care time: 40 min.   Montey Hora, [facility] For pager details, please see AMION or use Epic chat  After 1900, please call [facility]  for cross coverage needs 12/09/2020, 8:28 PM

## 2020-11-29 NOTE — ED Provider Notes (Addendum)
[facility] EMERGENCY DEPARTMENT Provider Note   CSN: [id number] Arrival date & time: 12/13/2020  0263     History Chief Complaint  Patient presents with   Altered Mental Status    [name] is a 70 y.o. male.  HPI: Patient is a 70 year old male with a reported history of throat cancer, atrial fibrillation on blood thinner, diabetes who was found down in the home of his apartment today approximately 2 hours prior to arrival by family member.  He was last seen normal yesterday morning walking out to his mailbox to get the mail. EMS reported his best GCS was 3.  They ventilated him with BVM in route to the hospital.  Reported his blood sugar was 300.  History limited by patient mental status.  Level 5 caveat applies.     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 Valere Dross and [facility]    Sleep apnea    tonsillar ca dx'd 02/2006   xrt/chemo comp 78/5885   Uncomplicated type 2 diabetes mellitus ([place]) 03/10/2014    Patient Active Problem List   Diagnosis Date Noted   Donor for liver transplant    Altered mental status 12/17/2020   Acute respiratory failure ([place])    Encephalopathy acute    Bronchiectasis without complication ([place]) 02/77/4128   ILD (interstitial lung disease) ([place]) 12/23/2019   Secondary hypercoagulable state ([place]) 07/08/2019   Pressure injury of skin 09/04/2018   AKI (acute  kidney injury) ([place])    Orthostatic hypotension 08/28/2018   Persistent atrial fibrillation ([place]) 11/17/2015   Essential hypertension 78/67/6720   Uncomplicated type 2 diabetes mellitus ([place]) 03/10/2014   Obstructive sleep apnea 03/10/2014   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   Hyperlipidemia 03/10/2014   Hypothyroidism 03/10/2014   Paroxysmal atrial fibrillation ([place]) 04/02/2013    Past Surgical History:  Procedure Laterality Date   ATRIAL FIBRILLATION ABLATION N/A 06/06/2019   Procedure: ATRIAL FIBRILLATION ABLATION;  Surgeon: Thompson Grayer, MD;  Location: [place] [facility];  Service: Cardiovascular;  Laterality: N/A;   BREAST SURGERY     CARDIAC CATHETERIZATION     CARDIOVERSION N/A 10/14/2015   Procedure: CARDIOVERSION;  Surgeon: Lelon Perla, MD;  Location: [place];  Service: Cardiovascular;  Laterality: N/A;   COLONOSCOPY WITH PROPOFOL N/A 06/03/2015   Procedure: COLONOSCOPY WITH PROPOFOL;  Surgeon: Laurence Spates, MD;  Location: [facility];  Service: Endoscopy;  Laterality: N/A;   COLONOSCOPY WITH PROPOFOL N/A 06/12/2018   Procedure: COLONOSCOPY WITH PROPOFOL;  Surgeon: Laurence Spates, MD;  Location: [facility];  Service: Endoscopy;  Laterality: N/A;   HOT HEMOSTASIS N/A 06/03/2015   Procedure: HOT HEMOSTASIS (ARGON PLASMA COAGULATION/BICAP);  Surgeon: Laurence Spates, MD;  Location: [facility];  Service: Endoscopy;  Laterality: N/A;   LOOP RECORDER INSERTION N/A 03/19/2018   Procedure: LOOP RECORDER INSERTION;  Surgeon: Thompson Grayer, MD;  Location: [place] [facility];  Service: Cardiovascular;  Laterality: N/A;  ORGAN PROCUREMENT N/A 12-22-2020   Procedure: ORGAN PROCUREMENT LIVER ONLY;  Surgeon: Md, Organ Recovery, MD;  Location: [facility];  Service: General;  Laterality: N/A;       Family History  Problem Relation Age of Onset   Breast cancer Mother    Coronary artery disease Father     Social History   Tobacco Use   Smoking status: Never   Smokeless  tobacco: Never  Vaping Use   Vaping Use: Never used  Substance Use Topics   Alcohol use: No    Alcohol/week: 0.0 standard drinks   Drug use: No    Home Medications Prior to Admission medications   Medication Sig Start Date End Date Taking? Authorizing Provider  acetaminophen (TYLENOL) 500 MG tablet Take 500 mg by mouth every 6 (six) hours as needed for moderate pain.   Yes [provider]  amLODipine (NORVASC) 2.5 MG tablet Take 2.5 mg by mouth daily.   Yes [provider]  cholecalciferol (VITAMIN D3) 25 MCG (1000 UNIT) tablet Take 1,000 Units by mouth daily.   Yes [provider]  glimepiride (AMARYL) 1 MG tablet Take 1 mg by mouth daily with breakfast.   Yes [provider]  levothyroxine (SYNTHROID) 125 MCG tablet Take 125 mcg by mouth daily before breakfast. 09/30/19  Yes [provider]  loratadine (CLARITIN) 10 MG tablet Take 10 mg by mouth daily as needed for allergies.   Yes [provider]  Omega-3 Fatty Acids (FISH OIL) 1000 MG CAPS Take 2,000 mg by mouth daily.   Yes [provider]  pravastatin (PRAVACHOL) 80 MG tablet Take 80 mg by mouth at bedtime.    Yes [provider]  quinapril (ACCUPRIL) 20 MG tablet Take 1 tablet (20 mg total) by mouth daily. 03/03/20  Yes Belva Crome, MD  rivaroxaban (XARELTO) 20 MG TABS tablet TAKE ONE TABLET BY MOUTH DAILY WITH SUPPER Patient taking differently: Take 20 mg by mouth daily with supper. TAKE ONE TABLET BY MOUTH DAILY WITH SUPPER 10/27/20  Yes Belva Crome, MD    Allergies    Nsaids and Amlodipine besylate  Review of Systems   Review of Systems  Unable to perform ROS: Patient unresponsive   Physical Exam Updated Vital Signs BP 120/72   Pulse 78   Temp 97.88 F (36.6 C)   Resp 12   Ht 6\' 4"  (1.93 m)   Wt 101.5 kg   SpO2 100%   BMI 27.24 kg/m   Physical Exam Constitutional:      Appearance: He is overweight. He is ill-appearing and toxic-appearing.      Interventions: Face mask in place.  HENT:     Head: No right periorbital erythema or left periorbital erythema.      Right Ear: No hemotympanum.     Left Ear: No hemotympanum.     Nose:     Comments:  Dried blood about the face and bilateral nares. No active hemorrhage    Mouth/Throat:     Mouth: No injury.  Eyes:     Pupils: Pupils are equal.     Right eye: Pupil is round, reactive and not sluggish.     Left eye: Pupil is sluggish. Pupil is round and reactive.  Neck:     Trachea: Abnormal tracheal secretions present.  Cardiovascular:     Rate and Rhythm: Tachycardia present.     Pulses:          Radial pulses are 2+ on the  right side and 2+ on the left side.       Dorsalis pedis pulses are 2+ on the right side and 2+ on the left side.     Heart sounds: Normal heart sounds.  Pulmonary:     Effort: Bradypnea present.     Breath sounds: Decreased air movement (bilaterally) present.     Comments:  Having agonal respirations though breathing on his own. Chest:     Comments:  Ecchymosis with abrasion to the R axilla Abdominal:     General: Abdomen is flat.     Palpations: Abdomen is soft.  Musculoskeletal:     Cervical back: No rigidity.     Right lower leg: No edema.     Left lower leg: No edema.  Neurological:     Mental Status: He is unresponsive.     GCS: GCS eye subscore is 1. GCS verbal subscore is 1. GCS motor subscore is 1.     Comments:  Blinks to threat bilaterally.  Intermittently moving the LEFT upper extremity, very slightly however and otherwise no eliciting movements to other extremities with noxious stimuli.    ED Results / Procedures / Treatments   Labs (all labs ordered are listed, but only abnormal results are displayed) Labs Reviewed  COMPREHENSIVE METABOLIC PANEL - Abnormal; Notable for the following components:      Result Value   Glucose, Bld 335 (*)    BUN 39 (*)    Creatinine, Ser 1.69 (*)    Alkaline Phosphatase 143 (*)    GFR,  Estimated 43 (*)    All other components within normal limits  CBC WITH DIFFERENTIAL/PLATELET - Abnormal; Notable for the following components:   WBC 21.0 (*)    Neutro Abs 18.9 (*)    Lymphs Abs 0.4 (*)    Monocytes Absolute 1.6 (*)    Abs Immature Granulocytes 0.11 (*)    All other components within normal limits  URINALYSIS, COMPLETE (UACMP) WITH MICROSCOPIC - Abnormal; Notable for the following components:   Glucose, UA >=500 (*)    Hgb urine dipstick MODERATE (*)    Ketones, ur 5 (*)    Protein, ur 100 (*)    Bacteria, UA RARE (*)    All other components within normal limits  LACTIC ACID, PLASMA - Abnormal; Notable for the following components:   Lactic Acid, Venous 3.7 (*)    All other components within normal limits  CBC - Abnormal; Notable for the following components:   WBC 18.1 (*)    All other components within normal limits  BASIC METABOLIC PANEL - Abnormal; Notable for the following components:   Glucose, Bld 284 (*)    BUN 40 (*)    Creatinine, Ser 1.55 (*)    GFR, Estimated 48 (*)    All other components within normal limits  HEMOGLOBIN A1C - Abnormal; Notable for the following components:   Hgb A1c MFr Bld 6.9 (*)    All other components within normal limits  CK - Abnormal; Notable for the following components:   Total CK 882 (*)    All other components within normal limits  GLUCOSE, CAPILLARY - Abnormal; Notable for the following components:   Glucose-Capillary 253 (*)    All other components within normal limits  PROTIME-INR - Abnormal; Notable for the following components:   Prothrombin Time 15.3 (*)    All other components within normal limits  GLUCOSE, CAPILLARY - Abnormal; Notable for the following components:   Glucose-Capillary 299 (*)  All other components within normal limits  SODIUM - Abnormal; Notable for the following components:   Sodium 148 (*)    All other components within normal limits  GLUCOSE, CAPILLARY - Abnormal; Notable for the  following components:   Glucose-Capillary 257 (*)    All other components within normal limits  GLUCOSE, CAPILLARY - Abnormal; Notable for the following components:   Glucose-Capillary 200 (*)    All other components within normal limits  GLUCOSE, CAPILLARY - Abnormal; Notable for the following components:   Glucose-Capillary 108 (*)    All other components within normal limits  COMPREHENSIVE METABOLIC PANEL - Abnormal; Notable for the following components:   Sodium 148 (*)    Chloride 117 (*)    Glucose, Bld 144 (*)    BUN 52 (*)    Creatinine, Ser 2.84 (*)    Calcium 8.5 (*)    Total Protein 5.5 (*)    Albumin 2.9 (*)    GFR, Estimated 23 (*)    All other components within normal limits  GLUCOSE, CAPILLARY - Abnormal; Notable for the following components:   Glucose-Capillary 131 (*)    All other components within normal limits  BILIRUBIN, DIRECT - Abnormal; Notable for the following components:   Bilirubin, Direct 0.3 (*)    All other components within normal limits  GLUCOSE, CAPILLARY - Abnormal; Notable for the following components:   Glucose-Capillary 139 (*)    All other components within normal limits  COMPREHENSIVE METABOLIC PANEL - Abnormal; Notable for the following components:   Sodium 148 (*)    Chloride 121 (*)    CO2 18 (*)    Glucose, Bld 187 (*)    BUN 49 (*)    Creatinine, Ser 2.61 (*)    Calcium 8.5 (*)    Total Protein 5.6 (*)    Albumin 3.0 (*)    GFR, Estimated 26 (*)    All other components within normal limits  BILIRUBIN, DIRECT - Abnormal; Notable for the following components:   Bilirubin, Direct 0.5 (*)    All other components within normal limits  PROTIME-INR - Abnormal; Notable for the following components:   Prothrombin Time 17.4 (*)    INR 1.4 (*)    All other components within normal limits  GLUCOSE, CAPILLARY - Abnormal; Notable for the following components:   Glucose-Capillary 161 (*)    All other components within normal limits  CBC  WITH DIFFERENTIAL/PLATELET - Abnormal; Notable for the following components:   WBC 14.5 (*)    RBC 3.84 (*)    Hemoglobin 11.4 (*)    HCT 34.3 (*)    Neutro Abs 13.5 (*)    Lymphs Abs 0.3 (*)    Abs Immature Granulocytes 0.10 (*)    All other components within normal limits  GLUCOSE, CAPILLARY - Abnormal; Notable for the following components:   Glucose-Capillary 206 (*)    All other components within normal limits  URINALYSIS, ROUTINE W REFLEX MICROSCOPIC - Abnormal; Notable for the following components:   Hgb urine dipstick MODERATE (*)    Ketones, ur 5 (*)    Leukocytes,Ua TRACE (*)    Bacteria, UA RARE (*)    All other components within normal limits  GLUCOSE, CAPILLARY - Abnormal; Notable for the following components:   Glucose-Capillary 250 (*)    All other components within normal limits  GLUCOSE, CAPILLARY - Abnormal; Notable for the following components:   Glucose-Capillary 289 (*)    All other components within normal  limits  COMPREHENSIVE METABOLIC PANEL - Abnormal; Notable for the following components:   Sodium 149 (*)    Chloride 119 (*)    CO2 18 (*)    Glucose, Bld 362 (*)    BUN 51 (*)    Creatinine, Ser 2.73 (*)    Calcium 8.7 (*)    Total Protein 5.6 (*)    Albumin 2.8 (*)    Total Bilirubin 1.3 (*)    GFR, Estimated 24 (*)    All other components within normal limits  PROTIME-INR - Abnormal; Notable for the following components:   Prothrombin Time 16.8 (*)    INR 1.4 (*)    All other components within normal limits  GLUCOSE, CAPILLARY - Abnormal; Notable for the following components:   Glucose-Capillary 316 (*)    All other components within normal limits  GLUCOSE, CAPILLARY - Abnormal; Notable for the following components:   Glucose-Capillary 327 (*)    All other components within normal limits  GLUCOSE, CAPILLARY - Abnormal; Notable for the following components:   Glucose-Capillary 314 (*)    All other components within normal limits  GLUCOSE,  CAPILLARY - Abnormal; Notable for the following components:   Glucose-Capillary 296 (*)    All other components within normal limits  BASIC METABOLIC PANEL - Abnormal; Notable for the following components:   Sodium 149 (*)    Potassium 3.4 (*)    Chloride 121 (*)    CO2 19 (*)    Glucose, Bld 328 (*)    BUN 52 (*)    Creatinine, Ser 2.71 (*)    Calcium 8.6 (*)    GFR, Estimated 25 (*)    All other components within normal limits  CBC - Abnormal; Notable for the following components:   WBC 24.4 (*)    RBC 3.88 (*)    Hemoglobin 11.5 (*)    HCT 36.2 (*)    All other components within normal limits  GLUCOSE, CAPILLARY - Abnormal; Notable for the following components:   Glucose-Capillary 277 (*)    All other components within normal limits  GLUCOSE, CAPILLARY - Abnormal; Notable for the following components:   Glucose-Capillary 265 (*)    All other components within normal limits  GLUCOSE, CAPILLARY - Abnormal; Notable for the following components:   Glucose-Capillary 244 (*)    All other components within normal limits  GLUCOSE, CAPILLARY - Abnormal; Notable for the following components:   Glucose-Capillary 178 (*)    All other components within normal limits  GLUCOSE, CAPILLARY - Abnormal; Notable for the following components:   Glucose-Capillary 177 (*)    All other components within normal limits  GLUCOSE, CAPILLARY - Abnormal; Notable for the following components:   Glucose-Capillary 124 (*)    All other components within normal limits  CBG MONITORING, ED - Abnormal; Notable for the following components:   Glucose-Capillary 295 (*)    All other components within normal limits  I-STAT CHEM 8, ED - Abnormal; Notable for the following components:   BUN 38 (*)    Creatinine, Ser 1.40 (*)    Glucose, Bld 327 (*)    All other components within normal limits  I-STAT ARTERIAL BLOOD GAS, ED - Abnormal; Notable for the following components:   pH, Arterial 7.461 (*)    pO2,  Arterial 339 (*)    All other components within normal limits  POCT I-STAT 7, (LYTES, BLD GAS, ICA,H+H) - Abnormal; Notable for the following components:   pCO2 arterial 28.8 (*)  pO2, Arterial 55 (*)    Bicarbonate 19.2 (*)    TCO2 20 (*)    Acid-base deficit 4.0 (*)    All other components within normal limits  POCT I-STAT 7, (LYTES, BLD GAS, ICA,H+H) - Abnormal; Notable for the following components:   pH, Arterial 7.333 (*)    Acid-base deficit 4.0 (*)    Sodium 150 (*)    HCT 34.0 (*)    Hemoglobin 11.6 (*)    All other components within normal limits  POCT I-STAT 7, (LYTES, BLD GAS, ICA,H+H) - Abnormal; Notable for the following components:   pH, Arterial 7.544 (*)    pCO2 arterial 24.0 (*)    pO2, Arterial 122 (*)    TCO2 21 (*)    Sodium 147 (*)    HCT 31.0 (*)    Hemoglobin 10.5 (*)    All other components within normal limits  POCT I-STAT 7, (LYTES, BLD GAS, ICA,H+H) - Abnormal; Notable for the following components:   Bicarbonate 18.9 (*)    TCO2 20 (*)    Acid-base deficit 5.0 (*)    Sodium 149 (*)    HCT 34.0 (*)    Hemoglobin 11.6 (*)    All other components within normal limits  POCT I-STAT 7, (LYTES, BLD GAS, ICA,H+H) - Abnormal; Notable for the following components:   pH, Arterial 7.238 (*)    pCO2 arterial 54.5 (*)    pO2, Arterial 62 (*)    Acid-base deficit 5.0 (*)    Sodium 149 (*)    HCT 34.0 (*)    Hemoglobin 11.6 (*)    All other components within normal limits  POCT I-STAT 7, (LYTES, BLD GAS, ICA,H+H) - Abnormal; Notable for the following components:   pH, Arterial 7.303 (*)    pO2, Arterial 121 (*)    TCO2 21 (*)    Acid-base deficit 6.0 (*)    Sodium 151 (*)    HCT 32.0 (*)    Hemoglobin 10.9 (*)    All other components within normal limits  POCT I-STAT 7, (LYTES, BLD GAS, ICA,H+H) - Abnormal; Notable for the following components:   pO2, Arterial 144 (*)    Bicarbonate 19.4 (*)    TCO2 20 (*)    Acid-base deficit 6.0 (*)    Sodium 151  (*)    HCT 32.0 (*)    Hemoglobin 10.9 (*)    All other components within normal limits  POCT I-STAT 7, (LYTES, BLD GAS, ICA,H+H) - Abnormal; Notable for the following components:   pH, Arterial 7.342 (*)    pO2, Arterial 112 (*)    Bicarbonate 18.6 (*)    TCO2 20 (*)    Acid-base deficit 7.0 (*)    Sodium 152 (*)    HCT 33.0 (*)    Hemoglobin 11.2 (*)    All other components within normal limits  CULTURE, BLOOD (ROUTINE X 2)  CULTURE, BLOOD (ROUTINE X 2)  URINE CULTURE  SARS CORONAVIRUS 2 (TAT 6-24 HRS)  CULTURE, BLOOD (ROUTINE X 2)  CULTURE, BLOOD (ROUTINE X 2)  MRSA PCR SCREENING  URINE CULTURE  RESP PANEL BY RT-PCR (FLU A&B, COVID) ARPGX2  RAPID URINE DRUG SCREEN, HOSP PERFORMED  ETHANOL  APTT  HIV ANTIBODY (ROUTINE TESTING W REFLEX)  MAGNESIUM  PHOSPHORUS  TSH  TRIGLYCERIDES  GLUCOSE, CAPILLARY  APTT  APTT  TYPE AND SCREEN  ABO/RH  SURGICAL PATHOLOGY    EKG EKG Interpretation  Date/Time:  Monday November 29 2020 18:44:30  EDT Ventricular Rate:  92 PR Interval:  168 QRS Duration: 89 QT Interval:  366 QTC Calculation: 453 R Axis:   -15 Text Interpretation: Sinus rhythm Atrial premature complex Borderline left axis deviation Confirmed by Veryl Speak [phone]) on 12/01/2020 1:01:08 AM  Radiology No results found.   Procedures Procedure Name: Intubation Date/Time: 12/10/2020 7:09 PM Performed by: Pearson Grippe, DO Pre-anesthesia Checklist: Patient identified, Patient being monitored, Emergency Drugs available, Timeout performed and Suction available Oxygen Delivery Method: Ambu bag Preoxygenation: Pre-oxygenation with 100% oxygen Induction Type: Rapid sequence Ventilation: Two handed mask ventilation required Laryngoscope Size: Glidescope and 3 Grade View: Grade II Tube size: 7.5 mm Number of attempts: 1 Airway Equipment and Method: Rigid stylet Placement Confirmation: ETT inserted through vocal cords under direct vision, Positive ETCO2, CO2 detector  and Breath sounds checked- equal and bilateral Secured at: 24 cm Tube secured with: ETT holder Dental Injury: Teeth and Oropharynx as per pre-operative assessment  Difficulty Due To: Difficult Airway-due to vocal cord/laryngeal edema Comments: View was grade 2 and not difficult to obtain however passing ETT past the cords came with some moderate resistance.    .Critical Care  Date/Time: 01/03/2021 12:18 AM Performed by: Luna Fuse, MD Authorized by: Luna Fuse, MD   Critical care provider statement:    Critical care time (minutes):  30   Critical care time was exclusive of:  Separately billable procedures and treating other patients and teaching time   Critical care was necessary to treat or prevent imminent or life-threatening deterioration of the following conditions:  Respiratory failure and CNS failure or compromise   Medications Ordered in ED Medications  etomidate (AMIDATE) injection (20 mg Intravenous Given 11/26/2020 1855)  rocuronium (ZEMURON) injection (100 mg Intravenous Given 12/20/2020 1855)  labetalol (NORMODYNE) injection 10 mg (10 mg Intravenous Given 12/03/2020 1930)  vancomycin (VANCOREADY) IVPB 2000 mg/400 mL (0 mg Intravenous Stopped 12/19/2020 0110)  piperacillin-tazobactam (ZOSYN) IVPB 3.375 g (3.375 g Intravenous New Bag/Given 12/21/2020 2152)  coag fact Xa recombinant (ANDEXXA) high dose infusion 1800 mg (1,800 mg Intravenous New Bag/Given 12/21/2020 2259)  iohexol (OMNIPAQUE) 300 MG/ML solution 100 mL (100 mLs Intravenous Contrast Given 12/19/2020 2042)  methylPREDNISolone sodium succinate (SOLU-MEDROL) 1,000 mg in sodium chloride 0.9 % 50 mL IVPB (0 mg Intravenous Stopped 12/01/20 0021)  albumin human 5 % solution 25 g (0 g Intravenous Stopped 12/01/20 0633)  lactated ringers bolus 500 mL (500 mLs Intravenous New Bag/Given 12/01/20 4967)    ED Course  I have reviewed the triage vital signs and the nursing notes.  Pertinent labs & imaging results that were available during my care  of the patient were reviewed by me and considered in my medical decision making (see chart for details).    MDM Rules/Calculators/A&P                          This is a 70 year old male with history as above who presented to the emergency department unconscious via EMS getting oxygenated and ventilated with BVM. He was found down by family member today approximately 2 hours prior to arrival.  Last known normal was yesterday morning. Patient normally alert and oriented x4 and does all of his daily ADLs.  In the emergency department he was GCS of 3.  He had a obvious hematoma on the right scalp.  He was breathing on his own however not protecting his airway. I called the patient's sister who was already arriving at the  hospital.  We discussed the patient's wishes and if he had any living will or previous advance care documents.  She is not aware of any previous discussions but she told me that he would want everything done including intubation, mechanical ventilation and CPR. I did give a single dose of 4 mg IV Narcan even though presentation was not consistent with opioid toxicity.  He had no response after the Narcan. He was intubated per procedure note above.  Primary concern at this time is traumatic vs spontaneous intracranial hemorrhage especially in the setting of systemic anticoagulation on Xarelto. CT head ordered. Also considering sepsis, metabolic/electrolyte derangement.  ED course: Became hypertensive and tachycardic after intubation. Was coughing on the ETT. I had initially only ordered for prn fentanyl for sedation based on his poor neurologic exam prior to intubation however my impression is agitation on the vent. I ordered for propofol infusion and a single dose of IV labetalol. 2000 - ICU team has evaluated and will admit. 2015 - still has not been to the [facility] yet. Family now at bedside. Updated her on the plan. She said she found him underneath a table in his  home.  Patient will be transported to [place] and then to his ICU bed since one is available.  Final Clinical Impression(s) / ED Diagnoses Final diagnoses:  Glasgow coma scale total score 3-8, unspecified coma timing ([place])  Acute respiratory failure, unspecified whether with hypoxia or hypercapnia [facility])    Rx / DC Orders ED Discharge Orders     None        Pearson Grippe, DO 12/23/2020 2241    Luna Fuse, MD 12/10/20 2694    Luna Fuse, MD 01/03/21 [phone]

## 2020-11-29 NOTE — ED Notes (Signed)
4 mg narcan given by Valene Bors, rn

## 2020-11-29 NOTE — Care Plan (Signed)
Discussed case with Dr. Christella Noa.  Due to bleed location, evacuation likely to be neurologically devastating.  Recommend supportive care including blood pressure control, xarelto reversal, seizure suppression, and supportive care.  Discussed with sister at bedside and we agreed to give a few days to see if has any signs of improvement.  She did agree to DNR given underlying grim prognosis.  [name] MD PCCM

## 2020-11-29 NOTE — Progress Notes (Deleted)
Discussed case with Dr. Christella Noa.  Due to bleed location, evacuation likely to be neurologically devastating.  Recommend supportive care including blood pressure control, xarelto reversal, seizure suppression, and supportive care.  Discussed with sister at bedside and we agreed to give a few days to see if has any signs of improvement.  She did agree to DNR given underlying grim prognosis.  [name] MD PCCM

## 2020-11-29 NOTE — Progress Notes (Signed)
Pharmacy Antibiotic Note  [name] is a 70 y.o. male admitted on 12/06/2020 with pneumonia.  Pharmacy has been consulted for vancomycin and zosyn dosing. -WBC= 21, afeb -SCr= 1.4 (last was 2.0 in 05/2020)  Plan: Zosyn 3.375g IV q8h (4 hour infusion).  Vancomycin 2000mg  IV x1 followed by 1500mg  IV q24h (estimated AUC= 499) -Will follow renal function, cultures and clinical progress    Height: 6\' 4"  (193 cm) Weight: 113.2 kg (249 lb 9 oz) IBW/kg (Calculated) : 86.8  Temp (24hrs), Avg:98.2 F (36.8 C), Min:97.9 F (36.6 C), Max:98.4 F (36.9 C)  Recent Labs  Lab 12/21/2020 1900 12/21/2020 1951  WBC 21.0*  --   CREATININE 1.69* 1.40*  LATICACIDVEN 3.7*  --     Estimated Creatinine Clearance: 68.6 mL/min (A) (by C-G formula based on SCr of 1.4 mg/dL (H)).    Allergies  Allergen Reactions  . Nsaids Other (See Comments)    REACTION: PER MD REQUEST NOT TO TAKE  . Amlodipine Besylate     Other reaction(s): sluggish      Thank you for allowing pharmacy to be a part of this patient's care.  Hildred Laser, PharmD Clinical Pharmacist **Pharmacist phone directory can now be found on [email or web address] ([email or web address]).  Listed under [place].

## 2020-11-29 NOTE — Progress Notes (Signed)
Dictation on: 11/28/2020 11:14 PM by: Ashok Pall [[id number]]

## 2020-11-30 ENCOUNTER — Inpatient Hospital Stay (HOSPITAL_COMMUNITY): Payer: Medicare Other

## 2020-11-30 ENCOUNTER — Other Ambulatory Visit (HOSPITAL_COMMUNITY): Payer: Medicare Other

## 2020-11-30 DIAGNOSIS — N281 Cyst of kidney, acquired: Secondary | ICD-10-CM | POA: Diagnosis not present

## 2020-11-30 DIAGNOSIS — Z4682 Encounter for fitting and adjustment of non-vascular catheter: Secondary | ICD-10-CM | POA: Diagnosis not present

## 2020-11-30 DIAGNOSIS — I619 Nontraumatic intracerebral hemorrhage, unspecified: Secondary | ICD-10-CM | POA: Diagnosis not present

## 2020-11-30 DIAGNOSIS — S2220XA Unspecified fracture of sternum, initial encounter for closed fracture: Secondary | ICD-10-CM | POA: Diagnosis not present

## 2020-11-30 DIAGNOSIS — G9340 Encephalopathy, unspecified: Secondary | ICD-10-CM

## 2020-11-30 DIAGNOSIS — K8020 Calculus of gallbladder without cholecystitis without obstruction: Secondary | ICD-10-CM | POA: Diagnosis not present

## 2020-11-30 DIAGNOSIS — J90 Pleural effusion, not elsewhere classified: Secondary | ICD-10-CM | POA: Diagnosis not present

## 2020-11-30 DIAGNOSIS — J9811 Atelectasis: Secondary | ICD-10-CM | POA: Diagnosis not present

## 2020-11-30 DIAGNOSIS — R911 Solitary pulmonary nodule: Secondary | ICD-10-CM | POA: Diagnosis not present

## 2020-11-30 DIAGNOSIS — N179 Acute kidney failure, unspecified: Secondary | ICD-10-CM

## 2020-11-30 DIAGNOSIS — J9601 Acute respiratory failure with hypoxia: Secondary | ICD-10-CM

## 2020-11-30 DIAGNOSIS — Z526 Liver donor: Secondary | ICD-10-CM

## 2020-11-30 DIAGNOSIS — J9690 Respiratory failure, unspecified, unspecified whether with hypoxia or hypercapnia: Secondary | ICD-10-CM | POA: Diagnosis not present

## 2020-11-30 DIAGNOSIS — Z944 Liver transplant status: Secondary | ICD-10-CM | POA: Diagnosis not present

## 2020-11-30 LAB — COMPREHENSIVE METABOLIC PANEL
ALT: 12 U/L (ref 0–44)
AST: 25 U/L (ref 15–41)
Albumin: 2.9 g/dL — ABNORMAL LOW (ref 3.5–5.0)
Alkaline Phosphatase: 104 U/L (ref 38–126)
Anion gap: 9 (ref 5–15)
BUN: 52 mg/dL — ABNORMAL HIGH (ref 8–23)
CO2: 22 mmol/L (ref 22–32)
Calcium: 8.5 mg/dL — ABNORMAL LOW (ref 8.9–10.3)
Chloride: 117 mmol/L — ABNORMAL HIGH (ref 98–111)
Creatinine, Ser: 2.84 mg/dL — ABNORMAL HIGH (ref 0.61–1.24)
GFR, Estimated: 23 mL/min — ABNORMAL LOW (ref >60)
Glucose, Bld: 144 mg/dL — ABNORMAL HIGH (ref 70–99)
Potassium: 3.9 mmol/L (ref 3.5–5.1)
Sodium: 148 mmol/L — ABNORMAL HIGH (ref 135–145)
Total Bilirubin: 0.8 mg/dL (ref 0.3–1.2)
Total Protein: 5.5 g/dL — ABNORMAL LOW (ref 6.5–8.1)

## 2020-11-30 LAB — POCT I-STAT 7, (LYTES, BLD GAS, ICA,H+H)
Acid-base deficit: 4 mmol/L — ABNORMAL HIGH (ref 0.0–2.0)
Acid-base deficit: 1 mmol/L (ref 0.0–2.0)
Acid-base deficit: 4 mmol/L — ABNORMAL HIGH (ref 0.0–2.0)
Acid-base deficit: 5 mmol/L — ABNORMAL HIGH (ref 0.0–2.0)
Acid-base deficit: 5 mmol/L — ABNORMAL HIGH (ref 0.0–2.0)
Bicarbonate: 19.2 mmol/L — ABNORMAL LOW (ref 20.0–28.0)
Bicarbonate: 20.7 mmol/L (ref 20.0–28.0)
Bicarbonate: 21.5 mmol/L (ref 20.0–28.0)
Bicarbonate: 18.9 mmol/L — ABNORMAL LOW (ref 20.0–28.0)
Bicarbonate: 23.2 mmol/L (ref 20.0–28.0)
Calcium, Ion: 1.18 mmol/L (ref 1.15–1.40)
Calcium, Ion: 1.22 mmol/L (ref 1.15–1.40)
Calcium, Ion: 1.26 mmol/L (ref 1.15–1.40)
Calcium, Ion: 1.24 mmol/L (ref 1.15–1.40)
Calcium, Ion: 1.27 mmol/L (ref 1.15–1.40)
HCT: 41 % (ref 39.0–52.0)
HCT: 31 % — ABNORMAL LOW (ref 39.0–52.0)
HCT: 34 % — ABNORMAL LOW (ref 39.0–52.0)
HCT: 34 % — ABNORMAL LOW (ref 39.0–52.0)
HCT: 34 % — ABNORMAL LOW (ref 39.0–52.0)
Hemoglobin: 13.9 g/dL (ref 13.0–17.0)
Hemoglobin: 10.5 g/dL — ABNORMAL LOW (ref 13.0–17.0)
Hemoglobin: 11.6 g/dL — ABNORMAL LOW (ref 13.0–17.0)
Hemoglobin: 11.6 g/dL — ABNORMAL LOW (ref 13.0–17.0)
Hemoglobin: 11.6 g/dL — ABNORMAL LOW (ref 13.0–17.0)
O2 Saturation: 90 %
O2 Saturation: 99 %
O2 Saturation: 98 %
O2 Saturation: 98 %
O2 Saturation: 86 %
Patient temperature: 36.5
Patient temperature: 98.6
Patient temperature: 98.6
Patient temperature: 98.6
Patient temperature: 37.1
Potassium: 3.5 mmol/L (ref 3.5–5.1)
Potassium: 3.8 mmol/L (ref 3.5–5.1)
Potassium: 3.5 mmol/L (ref 3.5–5.1)
Potassium: 3.7 mmol/L (ref 3.5–5.1)
Potassium: 3.6 mmol/L (ref 3.5–5.1)
Sodium: 142 mmol/L (ref 135–145)
Sodium: 147 mmol/L — ABNORMAL HIGH (ref 135–145)
Sodium: 150 mmol/L — ABNORMAL HIGH (ref 135–145)
Sodium: 149 mmol/L — ABNORMAL HIGH (ref 135–145)
Sodium: 149 mmol/L — ABNORMAL HIGH (ref 135–145)
TCO2: 20 mmol/L — ABNORMAL LOW (ref 22–32)
TCO2: 21 mmol/L — ABNORMAL LOW (ref 22–32)
TCO2: 23 mmol/L (ref 22–32)
TCO2: 20 mmol/L — ABNORMAL LOW (ref 22–32)
TCO2: 25 mmol/L (ref 22–32)
pCO2 arterial: 28.8 mmHg — ABNORMAL LOW (ref 32.0–48.0)
pCO2 arterial: 24 mmHg — ABNORMAL LOW (ref 32.0–48.0)
pCO2 arterial: 40.6 mmHg (ref 32.0–48.0)
pCO2 arterial: 32.5 mmHg (ref 32.0–48.0)
pCO2 arterial: 54.5 mmHg — ABNORMAL HIGH (ref 32.0–48.0)
pH, Arterial: 7.429 (ref 7.350–7.450)
pH, Arterial: 7.544 — ABNORMAL HIGH (ref 7.350–7.450)
pH, Arterial: 7.333 — ABNORMAL LOW (ref 7.350–7.450)
pH, Arterial: 7.373 (ref 7.350–7.450)
pH, Arterial: 7.238 — ABNORMAL LOW (ref 7.350–7.450)
pO2, Arterial: 55 mmHg — ABNORMAL LOW (ref 83.0–108.0)
pO2, Arterial: 122 mmHg — ABNORMAL HIGH (ref 83.0–108.0)
pO2, Arterial: 105 mmHg (ref 83.0–108.0)
pO2, Arterial: 104 mmHg (ref 83.0–108.0)
pO2, Arterial: 62 mmHg — ABNORMAL LOW (ref 83.0–108.0)

## 2020-11-30 LAB — CBC
HCT: 40.6 % (ref 39.0–52.0)
Hemoglobin: 14 g/dL (ref 13.0–17.0)
MCH: 31.1 pg (ref 26.0–34.0)
MCHC: 34.5 g/dL (ref 30.0–36.0)
MCV: 90.2 fL (ref 80.0–100.0)
Platelets: 232 10*3/uL (ref 150–400)
RBC: 4.5 MIL/uL (ref 4.22–5.81)
RDW: 12.7 % (ref 11.5–15.5)
WBC: 18.1 10*3/uL — ABNORMAL HIGH (ref 4.0–10.5)
nRBC: 0 % (ref 0.0–0.2)

## 2020-11-30 LAB — BASIC METABOLIC PANEL
Anion gap: 10 (ref 5–15)
BUN: 40 mg/dL — ABNORMAL HIGH (ref 8–23)
CO2: 23 mmol/L (ref 22–32)
Calcium: 8.9 mg/dL (ref 8.9–10.3)
Chloride: 104 mmol/L (ref 98–111)
Creatinine, Ser: 1.55 mg/dL — ABNORMAL HIGH (ref 0.61–1.24)
GFR, Estimated: 48 mL/min — ABNORMAL LOW (ref >60)
Glucose, Bld: 284 mg/dL — ABNORMAL HIGH (ref 70–99)
Potassium: 4.1 mmol/L (ref 3.5–5.1)
Sodium: 137 mmol/L (ref 135–145)

## 2020-11-30 LAB — ABO/RH
ABO/RH(D): A POS
PT AG Type: POSITIVE

## 2020-11-30 LAB — SODIUM: Sodium: 148 mmol/L — ABNORMAL HIGH (ref 135–145)

## 2020-11-30 LAB — GLUCOSE, CAPILLARY
Glucose-Capillary: 257 mg/dL — ABNORMAL HIGH (ref 70–99)
Glucose-Capillary: 200 mg/dL — ABNORMAL HIGH (ref 70–99)
Glucose-Capillary: 108 mg/dL — ABNORMAL HIGH (ref 70–99)
Glucose-Capillary: 82 mg/dL (ref 70–99)
Glucose-Capillary: 131 mg/dL — ABNORMAL HIGH (ref 70–99)
Glucose-Capillary: 139 mg/dL — ABNORMAL HIGH (ref 70–99)

## 2020-11-30 LAB — SARS CORONAVIRUS 2 (TAT 6-24 HRS): SARS Coronavirus 2: NEGATIVE

## 2020-11-30 LAB — MAGNESIUM: Magnesium: 2.1 mg/dL (ref 1.7–2.4)

## 2020-11-30 LAB — TRIGLYCERIDES: Triglycerides: 90 mg/dL (ref <150)

## 2020-11-30 LAB — PROTIME-INR
INR: 1.2 (ref 0.8–1.2)
Prothrombin Time: 15.3 seconds — ABNORMAL HIGH (ref 11.4–15.2)

## 2020-11-30 LAB — BILIRUBIN, DIRECT: Bilirubin, Direct: 0.3 mg/dL — ABNORMAL HIGH (ref 0.0–0.2)

## 2020-11-30 LAB — PHOSPHORUS: Phosphorus: 2.6 mg/dL (ref 2.5–4.6)

## 2020-11-30 LAB — MRSA PCR SCREENING: MRSA by PCR: NEGATIVE

## 2020-11-30 MED ORDER — NOREPINEPHRINE 4 MG/250ML-% IV SOLN: 0.0000 ug/min | INTRAVENOUS | Status: DC

## 2020-11-30 MED ORDER — IOHEXOL 300 MG/ML  SOLN
100.0000 mL | Freq: Once | INTRAMUSCULAR | Status: AC | PRN
  Administered 2020-11-30: 100 mL via INTRAVENOUS

## 2020-11-30 MED ORDER — ALBUMIN HUMAN 5 % IV SOLN
25.0000 g | Freq: Once | INTRAVENOUS | Status: AC
  Administered 2020-11-30: 25 g via INTRAVENOUS
  Filled 2020-11-30: qty 500

## 2020-11-30 MED ORDER — SODIUM CHLORIDE 0.9 % IV SOLN: 250.0000 mL | INTRAVENOUS | Status: DC

## 2020-11-30 MED ORDER — SODIUM CHLORIDE 0.9 % IV SOLN: INTRAVENOUS | Status: DC | PRN

## 2020-11-30 MED ORDER — SODIUM CHLORIDE 0.9 % IV SOLN
1000.0000 mg | Freq: Once | INTRAVENOUS | Status: AC
  Administered 2020-11-30: 1000 mg via INTRAVENOUS
  Filled 2020-11-30: qty 8

## 2020-11-30 MED ORDER — NOREPINEPHRINE 4 MG/250ML-% IV SOLN
0.0000 ug/min | INTRAVENOUS | Status: DC
  Administered 2020-11-30: 17 ug/min via INTRAVENOUS
  Administered 2020-11-30: 2 ug/min via INTRAVENOUS
  Administered 2020-12-01: 1 ug/min via INTRAVENOUS
  Administered 2020-12-01: 7 ug/min via INTRAVENOUS
  Filled 2020-11-30 (×4): qty 250

## 2020-11-30 MED ORDER — LACTATED RINGERS IV BOLUS: 500.0000 mL | Freq: Once | INTRAVENOUS | Status: DC

## 2020-11-30 NOTE — Progress Notes (Signed)
[facility] PCCM  Met with family and CDS The only way the patient would be an organ donation candidate would be if we declare him brain dead.   Currently his blood pressure is low, so will need to start norepinephrine ASAP and then perform an apnea test per [facility] policy to assess for brain death.  Roselie Awkward, MD [place] PCCM Pager: [phone] Cell: [phone] If no response, please call [phone] until 7pm After 7:00 pm call Elink  [phone]

## 2020-11-30 NOTE — Progress Notes (Signed)
RT note-ventilator changes made prior to repeat apnea test. Apnea test was performed per protocol. Patient is now resumed with current vent settings prior to test.

## 2020-11-30 NOTE — Progress Notes (Signed)
Notified and spoke to [place] RN about Patient's soft blood pressures. SBP's in the 80's. E-Link stated not wanting to start pressors due to labile blood pressures and patient being on Cleviprex earlier in the shift. Will continue to monitor closely.

## 2020-11-30 NOTE — Progress Notes (Signed)
[facility] PCCM  Apnea test performed, pCO2 was low and did not rise > 32mmHg Decreased RR, will repeat  Roselie Awkward, MD [place] PCCM Pager: [phone] Cell: [phone] If no response, please call [phone] until 7pm After 7:00 pm call Elink  [phone]

## 2020-11-30 NOTE — Progress Notes (Signed)
Adult Brain Death Determination  Time of Examination: 12/21/2020 6:41 PM  A. No Evidence of /Cause of Reversible CNS Depression  1. Core temperature must be greater >36 degrees. Last temp: Temp: 98.78 F (37.1 C) (Note: If unable to achieve normothermia after 12 hours of temperature management, may consider proceeding with Brain Death Evaluation.):    yes  2. Evidence of severe metabolic perturbations that could potentate CNS depression. Consider glucose, Na, creatinine, PaCO2, SaO2.:    Absent  3. Evidence of drugs, by history or measurement, that could potentiate central nervous system depression: narcotics, ethanol, benzodiazepines, barbiturates, neuromuscular blockade.:     Absent  B. Absence of Cortical Function  1. GCS = 3:    yes  C. Absence of Brain Stem Reflexes and Responses  1. Pupils light-fixed    yes  2. Corneal reflexes:    Absent  3. Response to upper and lower airway stimulation, such as pharyngeal and endotracheal suctioning.:    Absent  4. Ocular response to head turning (eye movement).    Absent  D. Absence of Spontaneous Respirations  (Apnea test performed per Brain Death Policy. If not met due to hemodynamic/ventilatory instability, then perform EEG, TCD, or cerebral blow flow studies.)  1.   Spontaneous Respirations   Absent  2.   PaCO2 at start of apnea test:  32  3.   PaCO2 at end of apnea test:  54  4.   CO2 rise of 20 or greater from baseline:   yes  E. Document Confirmatory Test Utilized: (Optional) Nuclear cerebral flow, cerebral angiography (CT/MR angio), transcranial Doppler ultrasound, EEG, SSEP (record results).  1. Test results (if available):  n/a  Patient pronounced dead by neurological criteria at 1825 PM on 6/7.  Roselie Awkward, MD 11/25/2020 6:41 PM

## 2020-11-30 NOTE — Consult Note (Signed)
NEUROLOGY CONSULTATION NOTE   Date of service: November 30, 2020 Patient Name: [name] MRN:  [id number] DOB:  03/13/1951 Reason for consult: "IPH with SAH, assess for Hyperosmolar therapy" Requesting Provider: Candee Furbish, MD _ _ _   _ __   _ __ _ _  __ __   _ __   __ _  History of Present Illness  DOUGLAS SMOLINSKY is a 70 y.o. male with PMH significant for CAD, DM2, GERD, HLD, HTN, OSA, on Xarelto for Afibb who was found down at home with a LKW of 1600 on 11/28/20 with a hematoma on his R scalp.   Sister reports that Rocket lives independently, but they are constantly in touch over phone or text.  She did not hear from Shanon Brow on Sunday or on Monday.  She left early from work on Monday to check in on him and found him passed out on the living room floor with his head and shoulder wedged under a small table.  He had dried blood on his mouth and his face.  He also had a scalp hematoma.  He was not responsive so she called EMS and he was brought into the hospital.  Layman was intubated for failure to protect his airway.  A CT head without contrast was obtained and demonstrated a large left intraparenchymal hematoma in the left frontotemporal lobe about 91 cc in volume with clear extension into the left lateral ventricle.  There was also about 12 mm left-to-right midline shift with moderate surrounding edema.  In addition, he was noted to have subarachnoid hemorrhage throughout the right cerebral hemisphere involving temporal, occipital and frontal lobe with a scalp hematoma over the right parietal lobe.  Xarelto was reversed with Andexanet and he was admitted to the ICU.  Case was discussed with neurosurgery team and risk of intervention was thought to outweigh any potential benefit.  Neurology was consulted for management of cerebral edema and for further work-up and management.  MRS: 0 ICH Score: 4 NIHSS components Score: Comment  1a Level of Conscious 0[] 1[] 2[] 3[x]     1b LOC Questions 0[]  1[] 2[x]      1c LOC Commands 0[] 1[] 2[x]      2 Best Gaze 0[x] 1[] 2[]      3 Visual 0[] 1[] 2[] 3[x]     4 Facial Palsy 0[x] 1[] 2[] 3[]     5a Motor Arm - left 0[] 1[] 2[] 3[] 4[x] UN[]   5b Motor Arm - Right 0[] 1[] 2[] 3[] 4[x] UN[]   6a Motor Leg - Left 0[] 1[] 2[] 3[] 4[x] UN[]   6b Motor Leg - Right 0[] 1[] 2[] 3[] 4[x] UN[]   7 Limb Ataxia 0[x] 1[] 2[] 3[] UN[]    8 Sensory 0[] 1[] 2[x] UN[]     9 Best Language 0[] 1[] 2[] 3[x]     10 Dysarthria 0[] 1[] 2[] UN[x]     11  Extinct. and Inattention 0[x]  1[]  2[]       TOTAL: 31     ROS   Unable to obtain due to intubation and sedation.  Past History   Past Medical History:  Diagnosis Date  . CAD (coronary artery disease)    70% LCx stenosis, treated medically  . Diabetes mellitus   . Encounter for therapeutic drug monitoring 07/22/2013  . Essential hypertension 03/10/2014  . GERD (gastroesophageal reflux disease)   . Hyperlipidemia 03/10/2014  . Hypertension   .  Hypotension   . Hypothyroidism 03/10/2014  . Left atrial enlargement   . Obstructive sleep apnea 03/10/2014  . On Coumadin for atrial fibrillation ([place]) 04/02/2013  . Persistent atrial fibrillation ([place])   . 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 Valere Dross and [facility]   . Sleep apnea   . 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 Uncomplicated type 2 diabetes mellitus ([place]) 03/10/2014   Past Surgical History:  Procedure Laterality Date  . ATRIAL FIBRILLATION ABLATION N/A 06/06/2019   Procedure: ATRIAL FIBRILLATION ABLATION;  Surgeon: Thompson Grayer, MD;  Location: [facility];  Service: Cardiovascular;  Laterality: N/A;  . BREAST SURGERY    . CARDIAC CATHETERIZATION    . CARDIOVERSION N/A 10/14/2015   Procedure: CARDIOVERSION;  Surgeon: Lelon Perla, MD;  Location: [facility] ENDOSCOPY;  Service: Cardiovascular;  Laterality: N/A;  . COLONOSCOPY WITH PROPOFOL N/A 06/03/2015    Procedure: COLONOSCOPY WITH PROPOFOL;  Surgeon: Laurence Spates, MD;  Location: [facility] ENDOSCOPY;  Service: Endoscopy;  Laterality: N/A;  . COLONOSCOPY WITH PROPOFOL N/A 06/12/2018   Procedure: COLONOSCOPY WITH PROPOFOL;  Surgeon: Laurence Spates, MD;  Location: [facility] ENDOSCOPY;  Service: Endoscopy;  Laterality: N/A;  . HOT HEMOSTASIS N/A 06/03/2015   Procedure: HOT HEMOSTASIS (ARGON PLASMA COAGULATION/BICAP);  Surgeon: Laurence Spates, MD;  Location: [facility];  Service: Endoscopy;  Laterality: N/A;  . LOOP RECORDER INSERTION N/A 03/19/2018   Procedure: LOOP RECORDER INSERTION;  Surgeon: Thompson Grayer, MD;  Location: [facility];  Service: Cardiovascular;  Laterality: N/A;   Family History  Problem Relation Age of Onset  . Breast cancer Mother   . Coronary artery disease Father    Social History   Socioeconomic History  . Marital status: Widowed    Spouse name: Not on file  . Number of children: Not on file  . Years of education: Not on file  . Highest education level: Not on file  Occupational History  . Not on file  Tobacco Use  . Smoking status: Never Smoker  . Smokeless tobacco: Never Used  Vaping Use  . Vaping Use: Never used  Substance and Sexual Activity  . Alcohol use: No    Alcohol/week: 0.0 standard drinks  . Drug use: No  . Sexual activity: Not on file  Other Topics Concern  . Not on file  Social History Narrative  . Not on file   Social Determinants of Health   Financial Resource Strain: Not on file  Food Insecurity: Not on file  Transportation Needs: Not on file  Physical Activity: Not on file  Stress: Not on file  Social Connections: Not on file   Allergies  Allergen Reactions  . Nsaids Other (See Comments)    REACTION: PER MD REQUEST NOT TO TAKE  . Amlodipine Besylate     Other reaction(s): sluggish    Medications   Medications Prior to Admission  Medication Sig Dispense Refill Last Dose  . acetaminophen (TYLENOL) 500 MG tablet Take 500 mg by  mouth 2 (two) times daily.     Marland Kitchen amLODipine (NORVASC) 2.5 MG tablet Take 2.5 mg by mouth daily.     Marland Kitchen desonide (DESOWEN) 0.05 % cream Apply 1 application topically 2 (two) times daily as needed (dry skin). Behind ears.     Marland Kitchen glimepiride (AMARYL) 1 MG tablet Take 1 mg by mouth daily with breakfast.     . levothyroxine (SYNTHROID) 125 MCG tablet Take 125  mcg by mouth daily before breakfast.     . loratadine (CLARITIN) 10 MG tablet Take 10 mg by mouth daily.      . Omega-3 Fatty Acids (FISH OIL) 1000 MG CAPS Take 2,000 mg by mouth daily. With Vit D     . pravastatin (PRAVACHOL) 80 MG tablet Take 80 mg by mouth at bedtime.      . quinapril (ACCUPRIL) 20 MG tablet Take 1 tablet (20 mg total) by mouth daily. 90 tablet 3   . rivaroxaban (XARELTO) 20 MG TABS tablet TAKE ONE TABLET BY MOUTH DAILY WITH SUPPER (Patient taking differently: Take 20 mg by mouth daily with supper. TAKE ONE TABLET BY MOUTH DAILY WITH SUPPER) 30 tablet 5      Vitals   Vitals:   12/14/2020 0015 12/19/2020 0030 11/26/2020 0045 11/25/2020 0100  BP: (!) 167/76 (!) 152/60 (!) 162/58 (!) 127/58  Pulse: 63 62 (!) 58 69  Resp: (!) 7 13 (!) 7 16  Temp: 98.78 F (37.1 C) 98.78 F (37.1 C) 98.78 F (37.1 C) 98.78 F (37.1 C)  TempSrc:      SpO2: 100% 100% 100% 100%  Weight:      Height:         Body mass index is 26.75 kg/m.  Physical Exam   General: Laying comfortably in bed; intubated. HENT: Dried blood on the mouth. Normal oropharynx and mucosa. Normal external appearance of ears and nose. Neck: Supple, no pain or tenderness CV: No JVD. No peripheral edema. Pulmonary: Symmetric Chest rise. Not breathing over the vent. Abdomen: Soft to touch, non-tender. Ext: No cyanosis, edema, or deformity Skin: No rash. Normal palpation of skin.  Musculoskeletal: Normal digits and nails by inspection. No clubbing.  Neurologic Examination on propofol 30 and fentanyl 100  Mental status/Cognition:  No response to voice, no response to  loud clap, no response to noxious stimuli.  Brainstem reflexes: Corneals: Negative Pupils: Oblong, 6 mm bilaterally, no response to light. Cough: Absent Gag: Absent.  Motor/sensory: No response to noxious stimuli in any of the extremities.  Reflexes:  Right Left Comments  Pectoralis      Biceps (C5/6) 1 1   Brachioradialis (C5/6) 1 1    Triceps (C6/7) 1 1    Patellar (L3/4) 1 1    Achilles (S1)      Hoffman      Plantar     Jaw jerk    Coordination/Complex Motor:  Unable to assess.  Labs   CBC:  Recent Labs  Lab 12/03/2020 1900 12/23/2020 1951 12/19/2020 2026  WBC 21.0*  --   --   NEUTROABS 18.9*  --   --   HGB 15.3 15.6 13.9  HCT 45.2 46.0 41.0  MCV 89.9  --   --   PLT 277  --   --     Basic Metabolic Panel:  Lab Results  Component Value Date   NA 138 12/15/2020   K 4.0 12/11/2020   CO2 22 12/14/2020   GLUCOSE 327 (H) 12/13/2020   BUN 38 (H) 12/01/2020   CREATININE 1.40 (H) 12/15/2020   CALCIUM 9.3 12/01/2020   GFRNONAA 43 (L) 12/23/2020   GFRAA 39 (L) 07/08/2019   Lipid Panel: No results found for: LDLCALC HgbA1c: No results found for: HGBA1C Urine Drug Screen:     Component Value Date/Time   LABOPIA NONE DETECTED 12/18/2020 1900   COCAINSCRNUR NONE DETECTED 12/06/2020 1900   LABBENZ NONE DETECTED 11/25/2020 1900   AMPHETMU NONE DETECTED  12/03/2020 1900   THCU NONE DETECTED 12/12/2020 1900   LABBARB NONE DETECTED 12/20/2020 1900    Alcohol Level     Component Value Date/Time   ETH <10 12/04/2020 1901    CT Head without contrast: 1. Large intraparenchymal hematoma in the left frontotemporal lobe measuring 6.1 x 5.5 x 5.2 cm (91cc), with clear extension into the left lateral ventricle. Moderate surrounding edema. 12 mm left-to-right midline shift. Moderate intraventricular blood layering in the right greater than left lateral ventricles. 2. Scattered subarachnoid hemorrhage throughout the right cerebral hemisphere involving the temporal and  occipital lobes, as well as frontal lobe.  MRI Brain: pending  Impression   JERAD DUNLAP is a 70 y.o. male on Xarelto for Afibb who was found passed out in his apartment with [facility] demonstrating a large L IPH with an ICH score of 4, he is s/p reversal of Xarelto with Andexanet. I think the L frontotemporal IPH is probably a hemorrhagic stroke and the R SAH is probably partially traumatic.  Neurosurgery consulted and thought to be a poor surgical candidate. He was started on Hypertonic Saline.  His eaxm is very poor with absent brainstem reflexes and no purposeful movements.  I discussed with patient's sister that there is a good chance he may not survive this and even if he does, he will likely have significant disability going forward. Sister opted to make him comfort care citingthat he is fiercely independent and this would not an acceptable quality of life for him.  Primary Diagnosis:   Intracerebral Hemorrhage  Secondary Diagnosis: Brain compression, Brain herniation, Cerebral edema, Essential (primary) hypertension, Paroxysmal atrial fibrillation and Type 2 diabetes mellitus w/o complications  Recommendations  - Was started on Hypertonic saline 3% with Q6H Sodium checks. - Sister at bedside, opted for comfort care. - Neurology inpatient team will signoff. Please feel free to contact us with any questions or concerns. ______________________________________________________________________   Thank you for the opportunity to take part in the care of this patient. If you have any further questions, please contact the neurology consultation attending.  Signed,  [place] Pager Number [phone] _ _ _   _ __   _ __ _ _  __ __   _ __   __ _

## 2020-11-30 NOTE — Procedures (Signed)
Arterial Catheter Insertion Procedure Note  [name]  [id number]  [birth year]  Date:12/09/2020  Time:10:49 PM    Provider Performing: Montey Hora    Procedure: Insertion of Arterial Line [id number]) with US guidance (21308)   Indication(s) Blood pressure monitoring and/or need for frequent ABGs  Consent Unable to obtain consent due to emergent nature of procedure.  Anesthesia None   Time Out Verified patient identification, verified procedure, site/side was marked, verified correct patient position, special equipment/implants available, medications/allergies/relevant history reviewed, required imaging and test results available.   Sterile Technique Maximal sterile technique including full sterile barrier drape, hand hygiene, sterile gown, sterile gloves, mask, hair covering, sterile ultrasound probe cover (if used).   Procedure Description Area of catheter insertion was cleaned with chlorhexidine and draped in sterile fashion. With real-time ultrasound guidance an arterial catheter was placed into the right radial artery.  Appropriate arterial tracings confirmed on monitor.     Complications/Tolerance None; patient tolerated the procedure well.   EBL Minimal   Specimen(s) None  Montey Hora, PA - [facility] For pager details, please see AMION or use Epic chat  After 1900, please call [place] for cross coverage needs 12/17/2020, 10:49 PM

## 2020-11-30 NOTE — Progress Notes (Signed)
eLink Physician-Brief Progress Note Patient Name: [name] DOB: 26-Oct-1950 MRN: [id number]   Date of Service  12/18/2020  HPI/Events of Note  HR 40's. SBP 130 notified by [facility] RN.  Attaching Dr [name] earlier notes: Discussed case with Dr. Christella Noa.  Due to bleed location, evacuation likely to be neurologically devastating.  Recommend supportive care including blood pressure control, xarelto reversal, seizure suppression, and supportive care.  Discussed with sister at bedside and we agreed to give a few days to see if has any signs of improvement.  She did agree to DNR given underlying grim prognosis.  [name] MD PCCM            eICU Interventions  Continue current care. Bed side RN updating [name]'s sister.      Intervention Category Intermediate Interventions: Arrhythmia - evaluation and management  Elmer Sow 12/04/2020, 1:27 AM

## 2020-11-30 NOTE — Progress Notes (Signed)
Attending:    Subjective: 70 y/o male found down on 6/6 with large intracerebral hemorrhage  GCS score 3 this morning Soft blood pressure this morning Bradycardia this morning, HR in 50's Have been discussing comfort measures with sister: she would like an update today  Objective: Vitals:   11/24/2020 0710 12/07/2020 0715 11/29/2020 0720 12/12/2020 0755  BP:      Pulse:      Resp: _0 Temp:      TempSrc:      SpO2:    100%  Weight:      Height:       Vent Mode: PRVC FiO2 (%):  [40 %-100 %] 50 % Set Rate:  [16 bmp-18 bmp] 16 bmp Vt Set:  [540 mL-680 mL] 680 mL PEEP:  [5 cmH20] 5 cmH20 Plateau Pressure:  [14 cmH20-19 cmH20] 18 cmH20  Intake/Output Summary (Last 24 hours) at 11/27/2020 0912 Last data filed at 12/13/2020 0700 Gross per 24 hour  Intake 1711.33 ml  Output 295 ml  Net 1416.33 ml    General:  In bed on vent HENT: NCAT ETT in place PULM: CTA B, vent supported breathing CV: RRR, no mgr GI: BS+, soft, nontender MSK: normal bulk and tone Neuro: no pupillary light response, no movement to pain, no eye opening, GCS 3    CBC    Component Value Date/Time   WBC 18.1 (H) 11/27/2020 0034   RBC 4.50 12/03/2020 0034   HGB 13.9 12/20/2020 0334   HGB 13.0 06/03/2019 0748   HGB 15.1 10/17/2010 0751   HCT 41.0 12/20/2020 0334   HCT 38.1 06/03/2019 0748   HCT 44.0 10/17/2010 0751   PLT 232 12/10/2020 0034   PLT 173 06/03/2019 0748   MCV 90.2 12/12/2020 0034   MCV 91 06/03/2019 0748   MCV 87.8 10/17/2010 0751   MCH 31.1 11/24/2020 0034   MCHC 34.5 12/10/2020 0034   RDW 12.7 11/26/2020 0034   RDW 13.6 06/03/2019 0748   RDW 12.8 10/17/2010 0751   LYMPHSABS 0.4 (L) 12/11/2020 1900   LYMPHSABS 0.9 06/03/2019 0748   LYMPHSABS 0.9 10/17/2010 0751   MONOABS 1.6 (H) 12/13/2020 1900   MONOABS 0.7 10/17/2010 0751   EOSABS 0.0 12/10/2020 1900   EOSABS 0.3 06/03/2019 0748   BASOSABS 0.0 12/10/2020 1900   BASOSABS 0.0 06/03/2019 0748   BASOSABS 0.0 10/17/2010 0751     BMET    Component Value Date/Time   NA 142 12/23/2020 0334   NA 137 06/03/2019 0748   NA 136 10/17/2010 0857   K 3.5 12/01/2020 0334   K 4.6 10/17/2010 0857   CL 104 12/17/2020 0034   CL 101 10/17/2010 0857   CO2 23 11/27/2020 0034   CO2 28 10/17/2010 0857   GLUCOSE 284 (H) 11/28/2020 0034   GLUCOSE 122 (H) 10/17/2010 0857   BUN 40 (H) 12/23/2020 0034   BUN 25 06/03/2019 0748   BUN 21 10/17/2010 0857   CREATININE 1.55 (H) 12/17/2020 0034   CREATININE 1.57 (H) 12/01/2015 0737   CALCIUM 8.9 12/08/2020 0034   CALCIUM 9.5 10/17/2010 0857   GFRNONAA 48 (L) 12/19/2020 0034   GFRAA 39 (L) 07/08/2019 0850    CXR images right upper lobe atelectasis  Impression/Plan:  Large intracerebral hemorrhage with midline shift, on eliquis at baseline > continue hypertonic saline, discuss comfort measures with his sister today  Possible pneumonia > continue antibiotics for now  Acute respiratory failure with hypoxemia > continuing full vent support until  the family has met with [facility], then plan one way extubation  Goals of care: met with family this morning, after further discussion they want to withdraw care as they state that he would not want to live like this.  Plan comfort measure, withdrawal of care.  My cc time 33 minutes  Roselie Awkward, MD [place] PCCM Pager: [phone] Cell: [phone] After 3pm or if no response, call [phone]

## 2020-11-30 NOTE — Progress Notes (Signed)
STROKE TEAM PROGRESS NOTE   SUBJECTIVE Patient's neurological condition remains poor and he is comatose and unresponsive with fixed dilated pupils and unreactive cornea no cough or gag reflex.  Family has agreed to DNR and comfort care.  Sister and husband at the bedside.  OBJECTIVE Vitals:   11/24/2020 1645 12/16/2020 1700  BP: (!) 86/58 (!) 98/59  Pulse: 84 90  Resp: 16 14  Temp: 98.6 F (37 C) 98.6 F (37 C)  SpO2: 100% 100%     Physical Exam Obese middle-aged Caucasian male who is intubated not sedated.  . Afebrile. Head is nontraumatic. Neck is supple without bruit.    Cardiac exam no murmur or gallop. Lungs are clear to auscultation. Distal pulses are well felt. Neurological Exam : Patient is intubated.  Not sedated.  Eyes are closed.  Does not follow any commands.  Pupils 5 mm fixed unreactive.  Corneal reflexes absent.  Doll's eye reflex is absent.  No cough or gag.  No respiratory effort above ventilator setting.  No response to sternal rub.  Reflexes depressed.  Plantars mute bilaterally.  Pertinent Laboratory Studies (past 3 days) / Diagnostics (past 24h) Recent Labs    12/01/2020 1900 12/04/2020 1951 12/08/2020 2026 12/03/2020 0034 12/22/2020 0334 11/24/2020 1322  WBC 21.0*  --   --  18.1*  --   --   HGB 15.3 15.6 13.9 14.0 13.9  --   PLT 277  --   --  232  --   --   NA 136 139 138 137 142 148*  K 4.9 4.1 4.0 4.1 3.5  --   CREATININE 1.69* 1.40*  --  1.55*  --   --   GLUCOSE 335* 327*  --  284*  --   --     CT HEAD WO CONTRAST  Result Date: 12/22/2020 CLINICAL DATA:  Neuro deficit, acute, stroke suspected Mental status change, unknown cause Found unresponsive. EXAM: CT HEAD WITHOUT CONTRAST TECHNIQUE: Contiguous axial images were obtained from the base of the skull through the vertex without intravenous contrast. COMPARISON:  Head CT 08/22/2018 FINDINGS: Brain: Large intraparenchymal hematoma in the left frontotemporal lobe measures 6.1 x 5.5 x 5.2 cm (volume = 91 cm^3). There  is clear extension into the left lateral ventricle with blood in the dependent occipital horns. Moderate surrounding edema. There is 12 mm left-to-right midline shift. There is scattered subarachnoid hemorrhage throughout the right cerebral hemisphere involving the temporal and occipital lobes, as well as posterior frontal lobe. Possible small right occipital infarct, age indeterminate. The basilar cisterns remain patent. Vascular: Slight generalized increased density of the intracranial structures without focal hyperdense vessel. Skull: No fracture or focal lesion. Sinuses/Orbits: Near complete opacification of left mastoid air cells, chronic. Partial opacification of right mastoid air cells, chronic. Diffuse mucosal thickening of ethmoid air cells with small fluid level in the right maxillary sinus. Bilateral cataract resection. Other: Small right frontal scalp hematoma. IMPRESSION: 1. Large intraparenchymal hematoma in the left frontotemporal lobe measuring 6.1 x 5.5 x 5.2 cm (91cc), with clear extension into the left lateral ventricle. Moderate surrounding edema. 12 mm left-to-right midline shift. Moderate intraventricular blood layering in the right greater than left lateral ventricles. 2. Scattered subarachnoid hemorrhage throughout the right cerebral hemisphere involving the temporal and occipital lobes, as well as frontal lobe. 3. Critical Value/emergent results were called by telephone at the time of interpretation on 12/07/2020 at 9:32 pm to provider Dr Prudencio Burly , who verbally acknowledged these results. Electronically Signed  By: Keith Rake M.D.   On: 12/06/2020 21:33   CT CERVICAL SPINE WO CONTRAST  Result Date: 11/28/2020 CLINICAL DATA:  Found unresponsive on floor. EXAM: CT CERVICAL SPINE WITHOUT CONTRAST TECHNIQUE: Multidetector CT imaging of the cervical spine was performed without intravenous contrast. Multiplanar CT image reconstructions were also generated. COMPARISON:  None. FINDINGS:  Alignment: Normal. Skull base and vertebrae: No acute fracture. No primary bone lesion or focal pathologic process. Non fusion posterior arch of C1, variant anatomy. There is also non fusion of the spinous process of C5 and C6. Soft tissues and spinal canal: No prevertebral fluid or swelling. No visible canal hematoma. Disc levels: Mild degenerative disc disease at C5-C6. There is multilevel facet hypertrophy. Upper chest: Suspected right upper lobe collapse with complete consolidation in the anterior right lung apex. Endotracheal and enteric tubes in place, the upper thoracic esophagus is fluid-filled. Other: None. IMPRESSION: 1. Mild degenerative change in the cervical spine without acute fracture or subluxation. 2. Suspected right upper lobe collapse with complete consolidation of lung in the anterior right lung apex. Electronically Signed   By: Keith Rake M.D.   On: 12/12/2020 21:33   DG Chest Port 1 View  Result Date: 11/25/2020 CLINICAL DATA:  Respiratory failure. EXAM: PORTABLE CHEST 1 VIEW COMPARISON:  Chest x-ray 12/04/2020. FINDINGS: Endotracheal tube, NG tube in stable position. Cardiac monitor device noted stable position. Heart size normal. Progressive atelectasis right upper lobe. Tiny pulmonary nodules previously noted on CT of 12/30/2019 best identified by prior CT. Small right pleural effusion. No pneumothorax. No acute bony abnormality identified. IMPRESSION: 1.  Lines and tubes in stable position. 2. Progressive right upper lobe atelectasis. Small right pleural effusion. Follow-up chest x-rays recommended to demonstrate clearing Electronically Signed   By: Marcello Moores  Register   On: 12/14/2020 06:36   DG Chest Port 1 View  Result Date: 12/16/2020 CLINICAL DATA:  Intubation. EXAM: PORTABLE CHEST 1 VIEW COMPARISON:  No recent exams.  Radiograph 08/28/2018. FINDINGS: Planted loop recorder projects over the left chest. Endotracheal tube tip 4.9 cm from the carina. Enteric tube in place with  tip below the diaphragm not included in the field of view. Low lung volumes. Chronic elevation of right hemidiaphragm. Mild cardiomegaly with stable mediastinal contours. There are ill-defined opacities in the right suprahilar lung extending to the apex. No large pleural effusion or pneumothorax. No pulmonary edema. IMPRESSION: 1. Endotracheal tube tip 4.9 cm from the carina. Enteric tube in place with tip below the diaphragm not included in the field of view. 2. Low lung volumes. Ill-defined opacities in the right suprahilar lung extending to the apex, may represent atelectasis or pneumonia. Electronically Signed   By: Keith Rake M.D.   On: 12/03/2020 19:35     ASSESSMENT Mr. FELIS QUILLIN is a 70 y.o. male on Xarelto for [employer] who was found passed out in his apartment with [facility] demonstrating a large L IPH with an ICH score of 4, he is s/p reversal of Xarelto with Andexanet. I think the L frontotemporal IPH is probably a hemorrhagic stroke and the R SAH is probably partially traumatic.  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  His prognosis quite terrible and is unlikely to survive and family has agreed to DNR and comfort care measures and will likely have ventilator withdrawn soon.    RECOMMENDATIONS A long discussion with patient's family at the bedside and discussed his poor prognosis and massive catastrophic nonsurvivable intracranial hemorrhage and agree  with plan of care for withdrawal of ventilator support which will be done later today. This patient is critically ill and at significant risk of neurological worsening, death and care requires constant monitoring of vital signs, hemodynamics,respiratory and cardiac monitoring, extensive review of multiple databases, frequent neurological assessment, discussion with family, other specialists and medical decision making of high complexity.I have made any additions or clarifications directly to the  above note.This critical care time does not reflect procedure time, or teaching time or supervisory time of PA/NP/Med Resident etc but could involve care discussion time.  I spent 30 minutes of neurocritical care time  in the care of  this patient.    Antony Contras, [facility] day # [place], MD [place] for Schedule & Pager information 11/25/2020 5:02 PM    To contact Stroke Continuity provider, please refer to [email or web address] After hours, contact General Neurology

## 2020-11-30 NOTE — Progress Notes (Signed)
Notified ELink about Patient's HR irregular fluctuating 30's-60's. Pupil's now unequal and non-reactive. Increased dyssynchrony on the vent. Awaiting ground team, will continue to monitor closely.  Also called Patient's sister and updated her. Patient's sister on the way to be with patient at bedside.

## 2020-11-30 NOTE — Progress Notes (Signed)
Aline placement attempted x3 by RT x2 unsuccessful right radial. CCM notified

## 2020-11-30 NOTE — TOC CAGE-AID Note (Signed)
Transition of Care [facility]) - CAGE-AID Screening   Patient Details  Name: [name] MRN: [id number] Date of Birth: [birth year]  Transition of Care [facility]) CM/SW Contact:    Clovis Cao, RN Phone Number: [phone] 12/08/2020, 8:51 AM   Clinical Narrative: Pt on ventilator and unable to participate   CAGE-AID Screening: Substance Abuse Screening unable to be completed due to: : Patient unable to participate

## 2020-11-30 NOTE — Progress Notes (Signed)
Pt transported from 462M Rm 10 to CT and back to 462M Rm 10. No complications noted

## 2020-11-30 NOTE — Progress Notes (Signed)
PCCM Interval Progress Note  Asked to talk to with pt's sister regarding possibly transitioning to comfort care. Sister not quite ready to do so and unsure of path she should take.  I supported a DNR status but told her she should discuss comfort care with her husband and children before making that big of a decision and to ensure that family was on board so that she could be at peace with things.  She agreed and is planning to discuss with husband and children in the morning.  For now, continue current supportive measures with DNR in place in the event of arrest.  Frequent PVC's noted. Discussed with sister that he may not survive through the night and she understands and confirms DNR status.   Montey Hora, [facility] For pager details, please see AMION or use Epic chat  After 1900, please call [place] for cross coverage needs 12/12/2020, 3:22 AM

## 2020-11-30 NOTE — Progress Notes (Addendum)
RT  Note- Apnea test performed with Dr. Lake Bells at the bedside, full test time was achieved, ABG's performed, patient was placed back on ventilator. 1st ABG PH- 7.54            PCo2-24            Po2- 122           Hco3-21 2nd ABG  PH-7.33               Pco2-41               Po2-105              Hco3-21

## 2020-12-01 ENCOUNTER — Other Ambulatory Visit (HOSPITAL_COMMUNITY): Payer: Medicare Other

## 2020-12-01 ENCOUNTER — Encounter (HOSPITAL_COMMUNITY): Admission: RE | Payer: Self-pay | Source: Home / Self Care

## 2020-12-01 ENCOUNTER — Ambulatory Visit (HOSPITAL_COMMUNITY): Admission: RE | Admit: 2020-12-01 | Payer: Medicare Other | Source: Home / Self Care | Admitting: Gastroenterology

## 2020-12-01 DIAGNOSIS — G9382 Brain death: Secondary | ICD-10-CM | POA: Diagnosis not present

## 2020-12-01 DIAGNOSIS — J9600 Acute respiratory failure, unspecified whether with hypoxia or hypercapnia: Secondary | ICD-10-CM | POA: Diagnosis not present

## 2020-12-01 DIAGNOSIS — Z526 Liver donor: Secondary | ICD-10-CM | POA: Diagnosis not present

## 2020-12-01 DIAGNOSIS — K759 Inflammatory liver disease, unspecified: Secondary | ICD-10-CM | POA: Diagnosis not present

## 2020-12-01 DIAGNOSIS — R40243 Glasgow coma scale score 3-8, unspecified time: Secondary | ICD-10-CM

## 2020-12-01 LAB — POCT I-STAT 7, (LYTES, BLD GAS, ICA,H+H)
Acid-base deficit: 6 mmol/L — ABNORMAL HIGH (ref 0.0–2.0)
Acid-base deficit: 6 mmol/L — ABNORMAL HIGH (ref 0.0–2.0)
Acid-base deficit: 7 mmol/L — ABNORMAL HIGH (ref 0.0–2.0)
Bicarbonate: 20 mmol/L (ref 20.0–28.0)
Bicarbonate: 19.4 mmol/L — ABNORMAL LOW (ref 20.0–28.0)
Bicarbonate: 18.6 mmol/L — ABNORMAL LOW (ref 20.0–28.0)
Calcium, Ion: 1.3 mmol/L (ref 1.15–1.40)
Calcium, Ion: 1.28 mmol/L (ref 1.15–1.40)
Calcium, Ion: 1.28 mmol/L (ref 1.15–1.40)
HCT: 32 % — ABNORMAL LOW (ref 39.0–52.0)
HCT: 32 % — ABNORMAL LOW (ref 39.0–52.0)
HCT: 33 % — ABNORMAL LOW (ref 39.0–52.0)
Hemoglobin: 10.9 g/dL — ABNORMAL LOW (ref 13.0–17.0)
Hemoglobin: 10.9 g/dL — ABNORMAL LOW (ref 13.0–17.0)
Hemoglobin: 11.2 g/dL — ABNORMAL LOW (ref 13.0–17.0)
O2 Saturation: 98 %
O2 Saturation: 99 %
O2 Saturation: 98 %
Patient temperature: 36.3
Patient temperature: 35.9
Patient temperature: 36.5
Potassium: 4.3 mmol/L (ref 3.5–5.1)
Potassium: 4.3 mmol/L (ref 3.5–5.1)
Potassium: 4.4 mmol/L (ref 3.5–5.1)
Sodium: 151 mmol/L — ABNORMAL HIGH (ref 135–145)
Sodium: 151 mmol/L — ABNORMAL HIGH (ref 135–145)
Sodium: 152 mmol/L — ABNORMAL HIGH (ref 135–145)
TCO2: 21 mmol/L — ABNORMAL LOW (ref 22–32)
TCO2: 20 mmol/L — ABNORMAL LOW (ref 22–32)
TCO2: 20 mmol/L — ABNORMAL LOW (ref 22–32)
pCO2 arterial: 40 mmHg (ref 32.0–48.0)
pCO2 arterial: 33.7 mmHg (ref 32.0–48.0)
pCO2 arterial: 34.2 mmHg (ref 32.0–48.0)
pH, Arterial: 7.303 — ABNORMAL LOW (ref 7.350–7.450)
pH, Arterial: 7.363 (ref 7.350–7.450)
pH, Arterial: 7.342 — ABNORMAL LOW (ref 7.350–7.450)
pO2, Arterial: 121 mmHg — ABNORMAL HIGH (ref 83.0–108.0)
pO2, Arterial: 144 mmHg — ABNORMAL HIGH (ref 83.0–108.0)
pO2, Arterial: 112 mmHg — ABNORMAL HIGH (ref 83.0–108.0)

## 2020-12-01 LAB — HEMOGLOBIN A1C
Hgb A1c MFr Bld: 6.9 % — ABNORMAL HIGH (ref 4.8–5.6)
Mean Plasma Glucose: 151 mg/dL

## 2020-12-01 LAB — CBC
HCT: 36.2 % — ABNORMAL LOW (ref 39.0–52.0)
Hemoglobin: 11.5 g/dL — ABNORMAL LOW (ref 13.0–17.0)
MCH: 29.6 pg (ref 26.0–34.0)
MCHC: 31.8 g/dL (ref 30.0–36.0)
MCV: 93.3 fL (ref 80.0–100.0)
Platelets: 224 10*3/uL (ref 150–400)
RBC: 3.88 MIL/uL — ABNORMAL LOW (ref 4.22–5.81)
RDW: 13.9 % (ref 11.5–15.5)
WBC: 24.4 10*3/uL — ABNORMAL HIGH (ref 4.0–10.5)
nRBC: 0 % (ref 0.0–0.2)

## 2020-12-01 LAB — COMPREHENSIVE METABOLIC PANEL
ALT: 15 U/L (ref 0–44)
ALT: 14 U/L (ref 0–44)
AST: 31 U/L (ref 15–41)
AST: 25 U/L (ref 15–41)
Albumin: 3 g/dL — ABNORMAL LOW (ref 3.5–5.0)
Albumin: 2.8 g/dL — ABNORMAL LOW (ref 3.5–5.0)
Alkaline Phosphatase: 92 U/L (ref 38–126)
Alkaline Phosphatase: 89 U/L (ref 38–126)
Anion gap: 9 (ref 5–15)
Anion gap: 12 (ref 5–15)
BUN: 49 mg/dL — ABNORMAL HIGH (ref 8–23)
BUN: 51 mg/dL — ABNORMAL HIGH (ref 8–23)
CO2: 18 mmol/L — ABNORMAL LOW (ref 22–32)
CO2: 18 mmol/L — ABNORMAL LOW (ref 22–32)
Calcium: 8.5 mg/dL — ABNORMAL LOW (ref 8.9–10.3)
Calcium: 8.7 mg/dL — ABNORMAL LOW (ref 8.9–10.3)
Chloride: 121 mmol/L — ABNORMAL HIGH (ref 98–111)
Chloride: 119 mmol/L — ABNORMAL HIGH (ref 98–111)
Creatinine, Ser: 2.61 mg/dL — ABNORMAL HIGH (ref 0.61–1.24)
Creatinine, Ser: 2.73 mg/dL — ABNORMAL HIGH (ref 0.61–1.24)
GFR, Estimated: 26 mL/min — ABNORMAL LOW (ref >60)
GFR, Estimated: 24 mL/min — ABNORMAL LOW (ref >60)
Glucose, Bld: 187 mg/dL — ABNORMAL HIGH (ref 70–99)
Glucose, Bld: 362 mg/dL — ABNORMAL HIGH (ref 70–99)
Potassium: 3.9 mmol/L (ref 3.5–5.1)
Potassium: 4.3 mmol/L (ref 3.5–5.1)
Sodium: 148 mmol/L — ABNORMAL HIGH (ref 135–145)
Sodium: 149 mmol/L — ABNORMAL HIGH (ref 135–145)
Total Bilirubin: 1.2 mg/dL (ref 0.3–1.2)
Total Bilirubin: 1.3 mg/dL — ABNORMAL HIGH (ref 0.3–1.2)
Total Protein: 5.6 g/dL — ABNORMAL LOW (ref 6.5–8.1)
Total Protein: 5.6 g/dL — ABNORMAL LOW (ref 6.5–8.1)

## 2020-12-01 LAB — URINALYSIS, ROUTINE W REFLEX MICROSCOPIC
Bilirubin Urine: NEGATIVE
Glucose, UA: NEGATIVE mg/dL
Ketones, ur: 5 mg/dL — AB
Nitrite: NEGATIVE
Protein, ur: NEGATIVE mg/dL
Specific Gravity, Urine: 1.024 (ref 1.005–1.030)
pH: 5 (ref 5.0–8.0)

## 2020-12-01 LAB — GLUCOSE, CAPILLARY
Glucose-Capillary: 161 mg/dL — ABNORMAL HIGH (ref 70–99)
Glucose-Capillary: 206 mg/dL — ABNORMAL HIGH (ref 70–99)
Glucose-Capillary: 250 mg/dL — ABNORMAL HIGH (ref 70–99)
Glucose-Capillary: 277 mg/dL — ABNORMAL HIGH (ref 70–99)
Glucose-Capillary: 289 mg/dL — ABNORMAL HIGH (ref 70–99)
Glucose-Capillary: 296 mg/dL — ABNORMAL HIGH (ref 70–99)
Glucose-Capillary: 314 mg/dL — ABNORMAL HIGH (ref 70–99)
Glucose-Capillary: 316 mg/dL — ABNORMAL HIGH (ref 70–99)
Glucose-Capillary: 327 mg/dL — ABNORMAL HIGH (ref 70–99)

## 2020-12-01 LAB — APTT
aPTT: 29 seconds (ref 24–36)
aPTT: 29 seconds (ref 24–36)

## 2020-12-01 LAB — CBC WITH DIFFERENTIAL/PLATELET
Abs Immature Granulocytes: 0.1 10*3/uL — ABNORMAL HIGH (ref 0.00–0.07)
Basophils Absolute: 0 10*3/uL (ref 0.0–0.1)
Basophils Relative: 0 %
Eosinophils Absolute: 0 10*3/uL (ref 0.0–0.5)
Eosinophils Relative: 0 %
HCT: 34.3 % — ABNORMAL LOW (ref 39.0–52.0)
Hemoglobin: 11.4 g/dL — ABNORMAL LOW (ref 13.0–17.0)
Immature Granulocytes: 1 %
Lymphocytes Relative: 2 %
Lymphs Abs: 0.3 10*3/uL — ABNORMAL LOW (ref 0.7–4.0)
MCH: 29.7 pg (ref 26.0–34.0)
MCHC: 33.2 g/dL (ref 30.0–36.0)
MCV: 89.3 fL (ref 80.0–100.0)
Monocytes Absolute: 0.6 10*3/uL (ref 0.1–1.0)
Monocytes Relative: 4 %
Neutro Abs: 13.5 10*3/uL — ABNORMAL HIGH (ref 1.7–7.7)
Neutrophils Relative %: 93 %
Platelets: 152 10*3/uL (ref 150–400)
RBC: 3.84 MIL/uL — ABNORMAL LOW (ref 4.22–5.81)
RDW: 13.4 % (ref 11.5–15.5)
WBC: 14.5 10*3/uL — ABNORMAL HIGH (ref 4.0–10.5)
nRBC: 0 % (ref 0.0–0.2)

## 2020-12-01 LAB — RESP PANEL BY RT-PCR (FLU A&B, COVID) ARPGX2
Influenza A by PCR: NEGATIVE
Influenza B by PCR: NEGATIVE
SARS Coronavirus 2 by RT PCR: NEGATIVE

## 2020-12-01 LAB — URINE CULTURE: Culture: NO GROWTH

## 2020-12-01 LAB — PROTIME-INR
INR: 1.4 — ABNORMAL HIGH (ref 0.8–1.2)
INR: 1.4 — ABNORMAL HIGH (ref 0.8–1.2)
Prothrombin Time: 17.4 seconds — ABNORMAL HIGH (ref 11.4–15.2)
Prothrombin Time: 16.8 seconds — ABNORMAL HIGH (ref 11.4–15.2)

## 2020-12-01 LAB — BILIRUBIN, DIRECT: Bilirubin, Direct: 0.5 mg/dL — ABNORMAL HIGH (ref 0.0–0.2)

## 2020-12-01 SURGERY — COLONOSCOPY WITH PROPOFOL
Anesthesia: Monitor Anesthesia Care

## 2020-12-01 MED ORDER — METOPROLOL TARTRATE 5 MG/5ML IV SOLN
INTRAVENOUS | Status: AC
  Administered 2020-12-01: 2.5 mg via INTRAVENOUS
  Filled 2020-12-01: qty 5

## 2020-12-01 MED ORDER — DEXTROSE 50 % IV SOLN: 0.0000 mL | INTRAVENOUS | Status: DC | PRN

## 2020-12-01 MED ORDER — LACTATED RINGERS IV BOLUS
500.0000 mL | Freq: Once | INTRAVENOUS | Status: AC
  Administered 2020-12-01: 500 mL via INTRAVENOUS

## 2020-12-01 MED ORDER — METOPROLOL TARTRATE 5 MG/5ML IV SOLN
2.5000 mg | Freq: Four times a day (QID) | INTRAVENOUS | Status: DC | PRN
Start: 2020-12-01 — End: 2020-12-05

## 2020-12-01 MED ORDER — INSULIN REGULAR(HUMAN) IN NACL 100-0.9 UT/100ML-% IV SOLN
INTRAVENOUS | Status: DC
  Administered 2020-12-01: 11.5 [IU]/h via INTRAVENOUS
  Administered 2020-12-02: 7 [IU]/h via INTRAVENOUS
  Filled 2020-12-01 (×2): qty 100

## 2020-12-01 MED ORDER — LIDOCAINE HCL (PF) 1 % IJ SOLN
INTRAMUSCULAR | Status: AC
  Filled 2020-12-01: qty 30

## 2020-12-01 MED ORDER — SODIUM CHLORIDE 0.45 % IV SOLN: INTRAVENOUS | Status: DC

## 2020-12-01 NOTE — Progress Notes (Signed)
Carelink Summary Report / Loop Recorder 

## 2020-12-01 NOTE — Progress Notes (Signed)
Inpatient Diabetes Program Recommendations  AACE/ADA: New Consensus Statement on Inpatient Glycemic Control (2015)  Target Ranges:  Prepandial:   less than 140 mg/dL      Peak postprandial:   less than 180 mg/dL (1-2 hours)      Critically ill patients:  140 - 180 mg/dL   Lab Results  Component Value Date   GLUCAP 250 (H) 12/01/2020   HGBA1C 6.9 (H) 12/07/2020    Review of Glycemic Control Results for OREY, MOURE (MRN [id number]) as of 12/01/2020 14:55  Ref. Range 12/01/2020 07:02 12/01/2020 11:26  Glucose-Capillary Latest Ref Range: 70 - 99 mg/dL 206 (H) 250 (H)    Inpatient Diabetes Program Recommendations:     Please consider IV insulin for optimal glucose control for possible organ donation.  Will continue to follow while inpatient.  Thank you, Reche Dixon, RN, BSN Diabetes Coordinator Inpatient Diabetes Program [phone] (team pager from 8a-5p)

## 2020-12-01 NOTE — Progress Notes (Signed)
eLink Physician-Brief Progress Note Patient Name: OAKLEY KOSSMAN DOB: 12-Nov-1950 MRN: [id number]   Date of Service  12/01/2020  HPI/Events of Note  Brain dead patient awaiting organ donation, he is hyperglycemic despite CBG with SQ SSI orders.  eICU Interventions  Insulin gtt ordered.        Kerry Kass Koki Buxton 12/01/2020, 8:24 PM

## 2020-12-01 NOTE — Procedures (Signed)
Interventional Radiology Procedure Note  Procedure: Ultrasound guided non-focal liver biopsy  Findings: Please refer to procedural dictation for full description. 18 ga core x2.  Samples placed on saline-soaked Telfa.  Complications: None immediate  Estimated Blood Loss: < 5 mL  Recommendations: Follow Pathology results.   Ruthann Cancer, MD

## 2020-12-01 NOTE — Progress Notes (Signed)
NAME:  [name], MRN:  [id number], DOB:  05/12/1951, LOS: 2 ADMISSION DATE:  12/05/2020, CONSULTATION DATE:  12/18/2020 REFERRING MD:  Almyra Free CHIEF COMPLAINT:  AMS   History of Present Illness:  [name] is a 70 y.o. male who has PMH as outlined below.  He presented to [facility] ED 6/6 after being found down in his home by his sister. Sister states she communicates with him almost daily and last communication was Saturday 6/4.  At the time, he had told her that he was feeling fine and somewhat better (hadn't been feeling well for a few days prior to that).  Sister states she texted him at 0800on day of presentation 6/6 and when she still did not receive a response by 1700, she went to his home and that's when she found him down.  He had a shirt on but no pants.  His head was somewhat under a table and he had some blankets near by.  Sister unsure what had happened. She did talk to neighbors who stated that they saw pt 1 day prior to arrival on 6/5 walking to mailbox and then later that afternoon also get out of his car and walk into his house.  He had not acted out of the ordinary at the time.  In the ED, he had minimally responsive pupils and remained unresponsive.  He was subsequently intubated.  He had dried blood about the nares and mouth but no obvious bleeding.  CT head is pending. ISTAT labs only noteable for mild AKI and hyperglycemia.  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  Significant Hospital Events: Including procedures, antibiotic start and stop dates in addition to other pertinent events   6/6 > admit. 10-Dec-2022 declared brain dead 12-10-22 ct chest/ab pelvis> RLL infiltrate and RUL atelectasis, aortic atherosclorsis, RLL nodule, no liver abnormality  Micro Data: Blood 6/6 >  Sputum 6/6 >  Flu 6/6 >  SARS CoV2 6/6 >   Antibiotics: Vanc 6/6 >  Zosyn 6/6 >   Interim History / Subjective:   No fever Declared brain dead yesterday  Objective:  Blood pressure (!) 118/50, pulse 66, temperature 98.24 F (36.8 C), resp. rate 10, height 6\' 4"  (1.93 m), weight 101.5 kg, SpO2 100 %.    Vent Mode: PRVC FiO2 (%):  [40 %-50 %] 40 % Set Rate:  [10 bmp-16 bmp] 10 bmp Vt Set:  [680 mL] 680 mL PEEP:  [5 cmH20] 5 cmH20 Plateau Pressure:  [15 cmH20-19 cmH20] 15 cmH20   Intake/Output Summary (Last 24 hours) at 12/01/2020 0929 Last data filed at 12/01/2020 0900 Gross per 24 hour  Intake 2735.86 ml  Output 2170 ml  Net 565.86 ml   Filed Weights   12/04/2020 2115 Dec 09, 2020 0500 12/01/20 0422  Weight: 99.7 kg 99.7 kg 101.5 kg    Examination:  General:  In bed on vent HENT: NCAT ETT in place PULM: CTA B, vent supported breathing CV: RRR, no mgr GI: BS+, soft, nontender MSK: normal bulk and tone Neuro: GCS 3    Labs/imaging personally reviewed:  CT head 6/6 > large  intraparenchymal hematoma in the left frontotemporal lobe measuring 6.1 x 5.5 x 5.2 cm with midline shift 12 mm CXR 6/6 > atelectasis, mild edema, possible RUL infiltrate.  Assessment & Plan:   Brain dead > as of 2022-12-02 Massive intracerebral hemorrhage Potential organ donor> currently driving care Acute hypoxic respiratory failure - 2/2 AMS.  S/p intubation in ED. HCAP AKI. Hx PAF (on Xarelto), HTN, HLD, CAD. Hx DM. Hx hypothyroidism. 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  Plan: Continue full vent support with VAP prevention measures Continue levophed for MAP  > 65 Continue zosyn Ongoing organ donation evaluation Continue current management Full code status until organ donation per organ donation protocol   Best practice (evaluated daily):  Diet:  NPO Pain/Anxiety/Delirium protocol (if indicated): No VAP protocol (if indicated): Yes DVT prophylaxis: Contraindicated - on hold until CT head back. GI prophylaxis: PPI Glucose control:  SSI Yes Central venous access:  N/A Arterial line:  N/A Foley:  Yes, and it is still needed Mobility:  bed rest  PT consulted: N/A Last date of multidisciplinary goals of care discussion: extensive conversation with his sister bedside on December 02, 2022 brain dead Code Status:  full code (part of CDS protocol until organ donation arranged. Disposition: ICU  Labs   CBC: Recent Labs  Lab 12/04/2020 1900 12/14/2020 1951 01-Dec-2020 0034 12/01/20 0334 12-01-20 1628 2020/12/01 1701 2020-12-01 1817 12/01/2020 1830 12/01/20 0606  WBC 21.0*  --  18.1*  --   --   --   --   --  14.5*  NEUTROABS 18.9*  --   --   --   --   --   --   --  13.5*  HGB 15.3   < > 14.0   < > 10.5* 11.6* 11.6* 11.6* 11.4*  HCT 45.2   < > 40.6   < > 31.0* 34.0* 34.0* 34.0* 34.3*  MCV 89.9  --  90.2  --   --   --   --   --  89.3  PLT 277  --  232  --   --   --   --   --  152   < > = values in this interval not displayed.    Basic Metabolic Panel: Recent Labs  Lab 11/28/2020 1900 12/13/2020 1951 12/04/2020 2026 December 01, 2020 0034 2020-12-01 0334 01-Dec-2020 1701 12/01/20 1817 01-Dec-2020 1830 01-Dec-2020 1934 12/01/20 0420  NA 136 139   < > 137   < > 150* 149* 149* 148* 148*  K 4.9 4.1   < > 4.1   < > 3.5 3.7 3.6 3.9 3.9  CL 100 104  --  104  --   --   --   --  117* 121*  CO2 22  --   --  23  --   --   --   --  22 18*  GLUCOSE 335* 327*  --  284*  --   --   --   --  144* 187*  BUN 39* 38*  --  40*  --   --   --   --  52* 49*  CREATININE 1.69* 1.40*  --  1.55*  --   --   --   --  2.84* 2.61*  CALCIUM 9.3  --   --  8.9  --   --   --   --  8.5* 8.5*  MG  --   --    --  2.1  --   --   --   --   --   --  PHOS  --   --   --  2.6  --   --   --   --   --   --    < > = values in this interval not displayed.   GFR: Estimated Creatinine Clearance: 32.8 mL/min (A) (by C-G formula based on SCr of 2.61 mg/dL (H)). Recent Labs  Lab 11/28/2020 1900 12/15/2020 0034 12/01/20 0606  WBC 21.0* 18.1* 14.5*  LATICACIDVEN 3.7*  --   --     Liver Function Tests: Recent Labs  Lab 11/28/2020 1900 12/20/2020 1934 12/01/20 0420  AST 27 25 31   ALT 14 12 15   ALKPHOS 143* 104 92  BILITOT 0.8 0.8 1.2  PROT 6.7 5.5* 5.6*  ALBUMIN 3.8 2.9* 3.0*   No results for input(s): LIPASE, AMYLASE in the last 168 hours. No results for input(s): AMMONIA in the last 168 hours.  ABG    Component Value Date/Time   PHART 7.238 (L) 11/24/2020 1830   PCO2ART 54.5 (H) 11/27/2020 1830   PO2ART 62 (L) 12/18/2020 1830   HCO3 23.2 12/10/2020 1830   TCO2 25 12/03/2020 1830   ACIDBASEDEF 5.0 (H) 12/16/2020 1830   O2SAT 86.0 11/27/2020 1830     Coagulation Profile: Recent Labs  Lab 12/21/2020 0025 12/01/20 0420  INR 1.2 1.4*    Cardiac Enzymes: Recent Labs  Lab 12/01/2020 2038  CKTOTAL 882*    HbA1C: Hgb A1c MFr Bld  Date/Time Value Ref Range Status  12/19/2020 08:38 PM 6.9 (H) 4.8 - 5.6 % Final    Comment:    (NOTE)         Prediabetes: 5.7 - 6.4         Diabetes: >6.4         Glycemic control for adults with diabetes: <7.0     CBG: Recent Labs  Lab 11/26/2020 1522 12/19/2020 1948 12/11/2020 2305 12/01/20 0312 12/01/20 0702  GLUCAP 82 131* 139* 161* 206*       Critical care time: 30 min.   Roselie Awkward, MD [place] P[facility]M Pager: [phone] Cell: [phone] If no response, please call [phone] until 7pm After 7:00 pm call Elink  [phone]  12/01/2020, 9:29 AM

## 2020-12-02 ENCOUNTER — Encounter (HOSPITAL_COMMUNITY): Admission: EM | Disposition: E | Payer: Self-pay | Source: Home / Self Care | Attending: Pulmonary Disease

## 2020-12-02 ENCOUNTER — Encounter (HOSPITAL_COMMUNITY): Payer: Medicare Other | Admitting: Anesthesiology

## 2020-12-02 ENCOUNTER — Other Ambulatory Visit (HOSPITAL_COMMUNITY): Payer: Medicare Other

## 2020-12-02 ENCOUNTER — Inpatient Hospital Stay (HOSPITAL_COMMUNITY): Payer: Medicare Other | Admitting: Anesthesiology

## 2020-12-02 DIAGNOSIS — J9601 Acute respiratory failure with hypoxia: Secondary | ICD-10-CM | POA: Diagnosis not present

## 2020-12-02 DIAGNOSIS — E039 Hypothyroidism, unspecified: Secondary | ICD-10-CM | POA: Diagnosis not present

## 2020-12-02 DIAGNOSIS — I480 Paroxysmal atrial fibrillation: Secondary | ICD-10-CM | POA: Diagnosis not present

## 2020-12-02 DIAGNOSIS — J690 Pneumonitis due to inhalation of food and vomit: Secondary | ICD-10-CM | POA: Diagnosis not present

## 2020-12-02 DIAGNOSIS — G9382 Brain death: Secondary | ICD-10-CM | POA: Diagnosis not present

## 2020-12-02 DIAGNOSIS — R578 Other shock: Secondary | ICD-10-CM | POA: Diagnosis not present

## 2020-12-02 DIAGNOSIS — I618 Other nontraumatic intracerebral hemorrhage: Secondary | ICD-10-CM | POA: Diagnosis not present

## 2020-12-02 DIAGNOSIS — Z66 Do not resuscitate: Secondary | ICD-10-CM | POA: Diagnosis not present

## 2020-12-02 DIAGNOSIS — Z20822 Contact with and (suspected) exposure to covid-19: Secondary | ICD-10-CM | POA: Diagnosis not present

## 2020-12-02 DIAGNOSIS — Z515 Encounter for palliative care: Secondary | ICD-10-CM | POA: Diagnosis not present

## 2020-12-02 DIAGNOSIS — J384 Edema of larynx: Secondary | ICD-10-CM | POA: Diagnosis not present

## 2020-12-02 DIAGNOSIS — G9340 Encephalopathy, unspecified: Secondary | ICD-10-CM | POA: Diagnosis not present

## 2020-12-02 DIAGNOSIS — G935 Compression of brain: Secondary | ICD-10-CM | POA: Diagnosis not present

## 2020-12-02 DIAGNOSIS — G4733 Obstructive sleep apnea (adult) (pediatric): Secondary | ICD-10-CM | POA: Diagnosis not present

## 2020-12-02 DIAGNOSIS — G936 Cerebral edema: Secondary | ICD-10-CM | POA: Diagnosis not present

## 2020-12-02 HISTORY — PX: ORGAN PROCUREMENT: SHX5270

## 2020-12-02 LAB — BASIC METABOLIC PANEL
Anion gap: 9 (ref 5–15)
BUN: 52 mg/dL — ABNORMAL HIGH (ref 8–23)
CO2: 19 mmol/L — ABNORMAL LOW (ref 22–32)
Calcium: 8.6 mg/dL — ABNORMAL LOW (ref 8.9–10.3)
Chloride: 121 mmol/L — ABNORMAL HIGH (ref 98–111)
Creatinine, Ser: 2.71 mg/dL — ABNORMAL HIGH (ref 0.61–1.24)
GFR, Estimated: 25 mL/min — ABNORMAL LOW (ref >60)
Glucose, Bld: 328 mg/dL — ABNORMAL HIGH (ref 70–99)
Potassium: 3.4 mmol/L — ABNORMAL LOW (ref 3.5–5.1)
Sodium: 149 mmol/L — ABNORMAL HIGH (ref 135–145)

## 2020-12-02 LAB — GLUCOSE, CAPILLARY
Glucose-Capillary: 124 mg/dL — ABNORMAL HIGH (ref 70–99)
Glucose-Capillary: 177 mg/dL — ABNORMAL HIGH (ref 70–99)
Glucose-Capillary: 178 mg/dL — ABNORMAL HIGH (ref 70–99)
Glucose-Capillary: 244 mg/dL — ABNORMAL HIGH (ref 70–99)
Glucose-Capillary: 265 mg/dL — ABNORMAL HIGH (ref 70–99)

## 2020-12-02 LAB — URINE CULTURE: Culture: NO GROWTH

## 2020-12-02 LAB — SURGICAL PATHOLOGY

## 2020-12-02 SURGERY — SURGICAL PROCUREMENT, ORGAN
Anesthesia: General | Site: Abdomen

## 2020-12-02 MED ORDER — VASOPRESSIN 20 UNIT/ML IV SOLN
INTRAVENOUS | Status: AC
  Filled 2020-12-02: qty 1

## 2020-12-02 MED ORDER — VASOPRESSIN 20 UNIT/ML IV SOLN
INTRAVENOUS | Status: DC | PRN
  Administered 2020-12-02 (×7): 1 [IU] via INTRAVENOUS

## 2020-12-02 MED ORDER — FENTANYL CITRATE (PF) 250 MCG/5ML IJ SOLN
INTRAMUSCULAR | Status: AC
  Filled 2020-12-02: qty 5

## 2020-12-02 MED ORDER — LACTATED RINGERS IV SOLN: INTRAVENOUS | Status: DC | PRN

## 2020-12-02 MED ORDER — HEPARIN SODIUM (PORCINE) 1000 UNIT/ML IJ SOLN
INTRAMUSCULAR | Status: DC | PRN
  Administered 2020-12-02: 30450 [IU] via INTRAVENOUS

## 2020-12-02 MED ORDER — PHENYLEPHRINE HCL (PRESSORS) 10 MG/ML IV SOLN
INTRAVENOUS | Status: DC | PRN
  Administered 2020-12-02 (×3): 80 ug via INTRAVENOUS
  Administered 2020-12-02: 40 ug via INTRAVENOUS
  Administered 2020-12-02: 80 ug via INTRAVENOUS

## 2020-12-02 MED ORDER — FENTANYL CITRATE (PF) 250 MCG/5ML IJ SOLN
INTRAMUSCULAR | Status: DC | PRN
  Administered 2020-12-02 (×3): 100 ug via INTRAVENOUS
  Administered 2020-12-02: 50 ug via INTRAVENOUS

## 2020-12-02 MED ORDER — ROCURONIUM BROMIDE 10 MG/ML (PF) SYRINGE
PREFILLED_SYRINGE | INTRAVENOUS | Status: DC | PRN
  Administered 2020-12-02 (×2): 50 mg via INTRAVENOUS

## 2020-12-02 MED ORDER — 0.9 % SODIUM CHLORIDE (POUR BTL) OPTIME
TOPICAL | Status: DC | PRN
  Administered 2020-12-02 (×4): 1000 mL

## 2020-12-02 MED ORDER — POTASSIUM CHLORIDE 20 MEQ PO PACK
40.0000 meq | PACK | Freq: Two times a day (BID) | ORAL | Status: DC
  Administered 2020-12-02: 40 meq
  Filled 2020-12-02: qty 2

## 2020-12-02 SURGICAL SUPPLY — 95 items
BLADE CLIPPER SURG (BLADE) ×2 IMPLANT
BLADE SAW STERNAL (BLADE) ×3 IMPLANT
BLADE SURG 10 STRL SS (BLADE) IMPLANT
CLIP VESOCCLUDE MED 24/CT (CLIP) IMPLANT
CLIP VESOCCLUDE SM WIDE 24/CT (CLIP) IMPLANT
CNTNR URN SCR LID CUP LEK RST (MISCELLANEOUS) ×1 IMPLANT
CONT SPEC 4OZ STRL OR WHT (MISCELLANEOUS)
COVER BACK TABLE 60X90IN (DRAPES) IMPLANT
COVER MAYO STAND STRL (DRAPES) IMPLANT
COVER SURGICAL LIGHT HANDLE (MISCELLANEOUS) ×3 IMPLANT
COVER WAND RF STERILE (DRAPES) ×1 IMPLANT
DRAPE HALF SHEET 40X57 (DRAPES) IMPLANT
DRAPE SLUSH MACHINE 52X66 (DRAPES) ×1 IMPLANT
DRAPE SLUSH/WARMER DISC (DRAPES) ×2 IMPLANT
DRSG COVADERM 4X10 (GAUZE/BANDAGES/DRESSINGS) ×4 IMPLANT
DRSG COVADERM 4X14 (GAUZE/BANDAGES/DRESSINGS) ×4 IMPLANT
DRSG TELFA 3X8 NADH (GAUZE/BANDAGES/DRESSINGS) IMPLANT
DURAPREP 26ML APPLICATOR (WOUND CARE) ×2 IMPLANT
ELECT BLADE 4.0 EZ CLEAN MEGAD (MISCELLANEOUS) ×3
ELECT BLADE 6.5 EXT (BLADE) IMPLANT
ELECT REM PT RETURN 9FT ADLT (ELECTROSURGICAL) ×3
ELECTRODE BLDE 4.0 EZ CLN MEGD (MISCELLANEOUS) IMPLANT
ELECTRODE REM PT RTRN 9FT ADLT (ELECTROSURGICAL) ×2 IMPLANT
GAUZE 4X4 16PLY RFD (DISPOSABLE) IMPLANT
GLOVE BIO SURGEON STRL SZ7 (GLOVE) IMPLANT
GLOVE BIO SURGEON STRL SZ7.5 (GLOVE) ×2 IMPLANT
GLOVE BIO SURGEON STRL SZ8 (GLOVE) IMPLANT
GLOVE BIO SURGEON STRL SZ8.5 (GLOVE) IMPLANT
GLOVE BIOGEL PI IND STRL 7.5 (GLOVE) IMPLANT
GLOVE BIOGEL PI IND STRL 8.5 (GLOVE) IMPLANT
GLOVE BIOGEL PI INDICATOR 7.5 (GLOVE)
GLOVE BIOGEL PI INDICATOR 8.5 (GLOVE)
GLOVE SRG 8 PF TXTR STRL LF DI (GLOVE) IMPLANT
GLOVE SURG LTX SZ6.5 (GLOVE) ×2 IMPLANT
GLOVE SURG POLYISO LF SZ7 (GLOVE) ×2 IMPLANT
GLOVE SURG POLYISO LF SZ7.5 (GLOVE) ×8 IMPLANT
GLOVE SURG PR MICRO ENCORE 7 (GLOVE) ×8 IMPLANT
GLOVE SURG PR MICRO ENCORE 7.5 (GLOVE) ×4 IMPLANT
GLOVE SURG SS PI 8.0 STRL IVOR (GLOVE) IMPLANT
GLOVE SURG UNDER POLY LF SZ7 (GLOVE) ×4 IMPLANT
GLOVE SURG UNDER POLY LF SZ7.5 (GLOVE) ×2 IMPLANT
GLOVE SURG UNDER POLY LF SZ8 (GLOVE) ×3
GOWN STRL REUS W/ TWL LRG LVL3 (GOWN DISPOSABLE) ×4 IMPLANT
GOWN STRL REUS W/ TWL XL LVL3 (GOWN DISPOSABLE) ×2 IMPLANT
GOWN STRL REUS W/TWL 2XL LVL3 (GOWN DISPOSABLE) ×4 IMPLANT
GOWN STRL REUS W/TWL LRG LVL3 (GOWN DISPOSABLE) ×6
GOWN STRL REUS W/TWL XL LVL3 (GOWN DISPOSABLE) ×18
HANDLE SUCTION POOLE (INSTRUMENTS) IMPLANT
KIT POST MORTEM ADULT 36X90 (BAG) ×3 IMPLANT
KIT TURNOVER KIT B (KITS) ×3 IMPLANT
LOOP VESSEL MAXI BLUE (MISCELLANEOUS) IMPLANT
LOOP VESSEL MINI RED (MISCELLANEOUS) IMPLANT
MANIFOLD NEPTUNE II (INSTRUMENTS) ×3 IMPLANT
NDL BIOPSY 14X6 SOFT TISS (NEEDLE) IMPLANT
NEEDLE BIOPSY 14X6 SOFT TISS (NEEDLE) IMPLANT
NS IRRIG 1000ML POUR BTL (IV SOLUTION) ×8 IMPLANT
PACK AORTA (CUSTOM PROCEDURE TRAY) ×3 IMPLANT
PAD ARMBOARD 7.5X6 YLW CONV (MISCELLANEOUS) ×4 IMPLANT
PAD DRESSING TELFA 3X8 NADH (GAUZE/BANDAGES/DRESSINGS) ×1 IMPLANT
PENCIL BUTTON HOLSTER BLD 10FT (ELECTRODE) ×1 IMPLANT
SOL PREP POV-IOD 4OZ 10% (MISCELLANEOUS) ×2 IMPLANT
SPONGE INTESTINAL PEANUT (DISPOSABLE) IMPLANT
SPONGE LAP 18X18 RF (DISPOSABLE) IMPLANT
STAPLER VISISTAT 35W (STAPLE) ×1 IMPLANT
SUCTION POOLE HANDLE (INSTRUMENTS) ×9
SUT BONE WAX W31G (SUTURE) IMPLANT
SUT ETHIBOND 5 LR DA (SUTURE) IMPLANT
SUT ETHILON 1 LR 30 (SUTURE) ×6 IMPLANT
SUT ETHILON 2 LR (SUTURE) ×8 IMPLANT
SUT PROLENE 3 0 RB 1 (SUTURE) IMPLANT
SUT PROLENE 3 0 SH 1 (SUTURE) IMPLANT
SUT PROLENE 4 0 RB 1 (SUTURE)
SUT PROLENE 4-0 RB1 .5 CRCL 36 (SUTURE) IMPLANT
SUT PROLENE 5 0 C 1 24 (SUTURE) IMPLANT
SUT PROLENE 6 0 BV (SUTURE) IMPLANT
SUT SILK 0 TIES 10X30 (SUTURE) IMPLANT
SUT SILK 1 SH (SUTURE) IMPLANT
SUT SILK 1 TIES 10X30 (SUTURE) IMPLANT
SUT SILK 2 0 (SUTURE) ×9
SUT SILK 2 0 SH (SUTURE) IMPLANT
SUT SILK 2 0 SH CR/8 (SUTURE) ×2 IMPLANT
SUT SILK 2 0 TIES 10X30 (SUTURE) IMPLANT
SUT SILK 2-0 18XBRD TIE 12 (SUTURE) IMPLANT
SUT SILK 3 0 SH CR/8 (SUTURE) IMPLANT
SUT SILK 3 0 TIES 10X30 (SUTURE) IMPLANT
SWAB COLLECTION DEVICE MRSA (MISCELLANEOUS) IMPLANT
SWAB CULTURE ESWAB REG 1ML (MISCELLANEOUS) IMPLANT
SYR 50ML LL SCALE MARK (SYRINGE) IMPLANT
SYRINGE TOOMEY DISP (SYRINGE) IMPLANT
TAPE UMBILICAL 1/8 X36 TWILL (MISCELLANEOUS) IMPLANT
TOWEL GREEN STERILE FF (TOWEL DISPOSABLE) ×2 IMPLANT
TUBE CONNECTING 12'X1/4 (SUCTIONS) ×2
TUBE CONNECTING 12X1/4 (SUCTIONS) ×3 IMPLANT
WATER STERILE IRR 1000ML POUR (IV SOLUTION) IMPLANT
YANKAUER SUCT BULB TIP NO VENT (SUCTIONS) ×1 IMPLANT

## 2020-12-03 ENCOUNTER — Encounter (HOSPITAL_COMMUNITY): Payer: Self-pay

## 2020-12-04 LAB — TYPE AND SCREEN
ABO/RH(D): A POS
Antibody Screen: NEGATIVE
PT AG Type: POSITIVE
Unit division: 0
Unit division: 0
Unit division: 0
Unit division: 0

## 2020-12-04 LAB — CULTURE, BLOOD (ROUTINE X 2)
Culture: NO GROWTH
Culture: NO GROWTH
Culture: NO GROWTH
Culture: NO GROWTH

## 2020-12-04 LAB — BPAM RBC
Blood Product Expiration Date: 202206282359
Blood Product Expiration Date: 202206282359
Blood Product Expiration Date: 202206292359
Blood Product Expiration Date: 202206292359
Unit Type and Rh: 6200
Unit Type and Rh: 6200
Unit Type and Rh: 6200
Unit Type and Rh: 6200

## 2020-12-06 ENCOUNTER — Ambulatory Visit: Payer: Medicare Other | Admitting: Podiatry

## 2020-12-24 NOTE — Death Summary Note (Signed)
DEATH SUMMARY   Patient Details  Name: [name] MRN: [id number] DOB: 06/03/1951  Admission/Discharge Information   Admit Date:  Dec 10, 2020  Date of Death: Date of Death: 12-13-20  Time of Death: Time of Death: 0548  Length of Stay: 3  Referring Physician: Kristen Loader, FNP   Reason(s) for Hospitalization  Massive intracranial hemorrhage  Diagnoses  Preliminary cause of death:  Intracranial hemorrhage, complicated by use of chronic anticoagulation Secondary Diagnoses (including complications and co-morbidities):  Active Problems:   AKI (acute kidney injury) ([place])   Altered mental status   Acute respiratory failure (HCC)   Encephalopathy acute   Donor for liver transplant Anticoagulant use DM2 Hypothyroidism History of throat/tonsillar cancer Acute respiratory failure with hypoxemia Aspiration pneumonia  Brief Hospital Course (including significant findings, care, treatment, and services provided and events leading to death)  [name] is a 70 y.o. year old male who presented to [facility] ED 11-Dec-2022 after being found down in his home by his sister. Sister states she communicates with him almost daily and last communication was Saturday 6/4.  At the time, he had told her that he was feeling fine and somewhat better (hadn't been feeling well for a few days prior to that).  Sister states she texted him at 0800on day of presentation 12/11/2022 and when she still did not receive a response by 1700, she went to his home and that's when she found him down.  He had a shirt on but no pants.  His head was somewhat under a table and he had some blankets near by.  Sister unsure what had happened.  She did talk to neighbors who stated that they saw pt 1 day prior to arrival on 6/5 walking to mailbox and then later that afternoon also get out of his car and walk into his house.  He had not acted out of the ordinary at the time.   In the ED, he had minimally responsive pupils and remained  unresponsive.  He was subsequently intubated.  He had dried blood about the nares and mouth but no obvious bleeding.  CT head is pending. ISTAT labs only noteable for mild AKI and hyperglycemia.  He was admitted to our facility on mechanical ventilation and was on vasopressors for shock related to severe brain injury.  He was also treated for pneumonia, presumably aspiration pneumonia.  His shock worsened and he had a brief cardiac arrest on 6/7.  Prior to the cardiac arrest his neurologic exam was consistent with a devastating brain injury and it was felt he was near brain dead.  We updated the patient's family and they changed his code status to DNR.  Later in the afternoon we performed an apnea test which was positive and he was declared brain dead.  We contacted [facility] and he was taken for liver donation.     Pertinent Labs and Studies  Significant Diagnostic Studies CT HEAD WO CONTRAST  Result Date: 12-10-2020 CLINICAL DATA:  Neuro deficit, acute, stroke suspected Mental status change, unknown cause Found unresponsive. EXAM: CT HEAD WITHOUT CONTRAST TECHNIQUE: Contiguous axial images were obtained from the base of the skull through the vertex without intravenous contrast. COMPARISON:  Head CT 08/22/2018 FINDINGS: Brain: Large intraparenchymal hematoma in the left frontotemporal lobe measures 6.1 x 5.5 x 5.2 cm (volume = 91 cm^3). There is clear extension into the left lateral ventricle with blood in the dependent occipital horns. Moderate surrounding edema. There is 12 mm left-to-right midline shift.  There is scattered subarachnoid hemorrhage throughout the right cerebral hemisphere involving the temporal and occipital lobes, as well as posterior frontal lobe. Possible small right occipital infarct, age indeterminate. The basilar cisterns remain patent. Vascular: Slight generalized increased density of the intracranial structures without focal hyperdense vessel. Skull: No fracture or focal lesion.  Sinuses/Orbits: Near complete opacification of left mastoid air cells, chronic. Partial opacification of right mastoid air cells, chronic. Diffuse mucosal thickening of ethmoid air cells with small fluid level in the right maxillary sinus. Bilateral cataract resection. Other: Small right frontal scalp hematoma. IMPRESSION: 1. Large intraparenchymal hematoma in the left frontotemporal lobe measuring 6.1 x 5.5 x 5.2 cm (91cc), with clear extension into the left lateral ventricle. Moderate surrounding edema. 12 mm left-to-right midline shift. Moderate intraventricular blood layering in the right greater than left lateral ventricles. 2. Scattered subarachnoid hemorrhage throughout the right cerebral hemisphere involving the temporal and occipital lobes, as well as frontal lobe. 3. Critical Value/emergent results were called by telephone at the time of interpretation on 12/21/2020 at 9:32 pm to provider Dr Prudencio Burly , who verbally acknowledged these results. Electronically Signed   By: Keith Rake M.D.   On: 11/25/2020 21:33   CT CERVICAL SPINE WO CONTRAST  Result Date: 11/27/2020 CLINICAL DATA:  Found unresponsive on floor. EXAM: CT CERVICAL SPINE WITHOUT CONTRAST TECHNIQUE: Multidetector CT imaging of the cervical spine was performed without intravenous contrast. Multiplanar CT image reconstructions were also generated. COMPARISON:  None. FINDINGS: Alignment: Normal. Skull base and vertebrae: No acute fracture. No primary bone lesion or focal pathologic process. Non fusion posterior arch of C1, variant anatomy. There is also non fusion of the spinous process of C5 and C6. Soft tissues and spinal canal: No prevertebral fluid or swelling. No visible canal hematoma. Disc levels: Mild degenerative disc disease at C5-C6. There is multilevel facet hypertrophy. Upper chest: Suspected right upper lobe collapse with complete consolidation in the anterior right lung apex. Endotracheal and enteric tubes in place, the upper  thoracic esophagus is fluid-filled. Other: None. IMPRESSION: 1. Mild degenerative change in the cervical spine without acute fracture or subluxation. 2. Suspected right upper lobe collapse with complete consolidation of lung in the anterior right lung apex. Electronically Signed   By: Keith Rake M.D.   On: 12/23/2020 21:33   CT CHEST ABDOMEN PELVIS W CONTRAST  Result Date: 12/16/2020 CLINICAL DATA:  Cardiac transplant donor assessment. Donor for liver transplant. EXAM: CT CHEST, ABDOMEN, AND PELVIS WITH CONTRAST TECHNIQUE: Multidetector CT imaging of the chest, abdomen and pelvis was performed following the standard protocol during bolus administration of intravenous contrast. CONTRAST:  111mL OMNIPAQUE IOHEXOL 300 MG/ML  SOLN COMPARISON:  Chest CT 12/30/2019. FINDINGS: CT CHEST FINDINGS Cardiovascular: Mild to moderate aortic atherosclerosis without aneurysm or dissection. Mild aortic tortuosity. Common origin of the brachiocephalic and left common carotid artery. Aortic root measures 3 cm. Mid ascending aorta measures 3.3 cm. Distal ascending aorta measures 3.5 cm. Transverse aorta measures 2.7 cm. Descending aorta measures 2.5 cm. There are coronary artery calcifications or stents. Borderline cardiomegaly. No filling defects in the central pulmonary arteries to the lobar level. No pericardial effusion. Mediastinum/Nodes: Endotracheal tube tip above the carina. Enteric tube within the esophagus, mild intraluminal fluid in the distal and mid esophagus. No esophageal wall thickening. No pathologic mediastinal or hilar adenopathy. Thyroid gland is diminutive. There is no axillary adenopathy. Lungs/Pleura: There is filling of the right lower lobe bronchus which may be due to mucous plugging or aspiration. Ill-defined opacity  in the dependent right lower lobe likely postobstructive atelectasis. There is also linear opacity in the perifissural right upper lobe and in the right middle lobe. Stable 3 mm right  lower lobe pulmonary nodule, series 5, image 64. Previous left basilar pulmonary nodule is not well seen on the current exam. Trace right pleural effusion. Musculoskeletal: Remote sternal fracture. Mild thoracic degenerative change. No acute osseous abnormality. Loop recorder in the left anterior chest wall. No other chest wall soft tissue abnormality. CT ABDOMEN PELVIS FINDINGS Hepatobiliary: No focal liver lesion. Right hepatic lobe measures 18.5 cm cranial caudal, and 12.1 cm transverse. Left lobe measures 5.5 cm cranial caudal and 13.1 cm transverse. Patent portal vein. Gallstones within physiologically distended gallbladder. No biliary dilatation. Pancreas: Unremarkable. No pancreatic ductal dilatation or surrounding inflammatory changes. Spleen: Normal in size without focal abnormality. Adrenals/Urinary Tract: Normal adrenal glands. Mild symmetric perinephric edema. No hydronephrosis. No renal calculi. 2 cm simple cyst arises from the lower right kidney. Homogeneous enhancement with symmetric excretion on delayed phase imaging. Urinary bladder is decompressed by Foley catheter. Stomach/Bowel: Enteric tube in the stomach. No gastric wall thickening. Normal small bowel without inflammation or obstruction. Appendix not definitively seen. Occasional colonic diverticulosis without diverticulitis. Vascular/Lymphatic: Mild to moderate aortic and branch atherosclerosis. No aortic aneurysm. Conventional hepatic arterial anatomy. No enlarged lymph nodes in the abdomen or pelvis. Reproductive: Prostate is unremarkable. Other: No ascites or free fluid. No free air. No body wall soft tissue abnormality. Musculoskeletal: Mild L4 superior endplate Schmorl's node versus mild compression deformity. No focal bone lesion. IMPRESSION: 1. Aortic atherosclerosis without acute aortic findings. There are coronary artery calcifications or stents. 2. There is filling of the right lower lobe bronchus which may be due to mucous plugging  or aspiration. Ill-defined opacity in the dependent right lower lobe likely postobstructive atelectasis. There is also perifissural atelectasis in the right upper lobe. Trace right pleural effusion. 3. Stable right lower lobe pulmonary nodule. Previous left lower lobe pulmonary nodule is not seen. 4. No focal hepatic abnormality. 5. Cholelithiasis without gallbladder inflammation. 6. Nine colonic diverticulosis without diverticulitis. 7. Mild L4 superior endplate Schmorl's node versus mild compression deformity. Aortic Atherosclerosis (ICD10-I70.0). Electronically Signed   By: Keith Rake M.D.   On: 12/10/2020 21:20   US BIOPSY (LIVER)  Result Date: 12/01/2020 INDICATION: 70 year old male pronounced brain dead, liver biopsy requested for organ donation purposes. EXAM: ULTRASOUND BIOPSY CORE LIVER MEDICATIONS: None. ANESTHESIA/SEDATION: None. COMPLICATIONS: None immediate. PROCEDURE: Informed written consent was obtained as part of organ donation protocol. Maximal Sterile Barrier Technique was utilized including caps, mask, sterile gowns, sterile gloves, sterile drape, hand hygiene and skin antiseptic. A timeout was performed prior to the initiation of the procedure. Preprocedure ultrasound demonstrated safe window in the right upper quadrant for nonfocal liver biopsy. The right upper quadrant was prepped and draped in standard fashion. A skin nick was made. A 18 gauge core biopsy device was advanced to the hepatic parenchyma under ultrasound guidance. Next, a total of 2, 18 gauge core biopsies were obtained. The samples were placed on saline soaked Telfa pad and sent to pathology. Postprocedure ultrasound demonstrated no evidence of perihepatic fluid collection. IMPRESSION: Technically successful ultrasound-guided nonfocal core liver biopsy from the right lobe of the liver. Ruthann Cancer, MD [facility] Electronically Signed   By: Ruthann Cancer MD    On: 12/01/2020 12:16   DG Chest Port 1 View  Result Date: 12/03/2020 CLINICAL DATA:  Respiratory failure. EXAM: PORTABLE  CHEST 1 VIEW COMPARISON:  Chest x-ray 12/19/2020. FINDINGS: Endotracheal tube, NG tube in stable position. Cardiac monitor device noted stable position. Heart size normal. Progressive atelectasis right upper lobe. Tiny pulmonary nodules previously noted on CT of 12/30/2019 best identified by prior CT. Small right pleural effusion. No pneumothorax. No acute bony abnormality identified. IMPRESSION: 1.  Lines and tubes in stable position. 2. Progressive right upper lobe atelectasis. Small right pleural effusion. Follow-up chest x-rays recommended to demonstrate clearing Electronically Signed   By: Marcello Moores  Register   On: 12/23/2020 06:36   DG Chest Port 1 View  Result Date: 12/11/2020 CLINICAL DATA:  Intubation. EXAM: PORTABLE CHEST 1 VIEW COMPARISON:  No recent exams.  Radiograph 08/28/2018. FINDINGS: Planted loop recorder projects over the left chest. Endotracheal tube tip 4.9 cm from the carina. Enteric tube in place with tip below the diaphragm not included in the field of view. Low lung volumes. Chronic elevation of right hemidiaphragm. Mild cardiomegaly with stable mediastinal contours. There are ill-defined opacities in the right suprahilar lung extending to the apex. No large pleural effusion or pneumothorax. No pulmonary edema. IMPRESSION: 1. Endotracheal tube tip 4.9 cm from the carina. Enteric tube in place with tip below the diaphragm not included in the field of view. 2. Low lung volumes. Ill-defined opacities in the right suprahilar lung extending to the apex, may represent atelectasis or pneumonia. Electronically Signed   By: Keith Rake M.D.   On: 12/10/2020 19:35   DG OR LOCAL ABDOMEN  Result Date: Dec 08, 2020 CLINICAL DATA:  Protocol after organ harvesting EXAM: OR LOCAL ABDOMEN COMPARISON:  12/06/2020 FINDINGS: The upper abdomen was excluded from view. The patient's  arms overlap the abdomen and are associated with IV tubing. No retained surgical instrument or sponge seen. These results were called by telephone at the time of interpretation on 12/08/20 at 7:09 am to provider OR staff at [phone]. No retained instrument was expected or missing. IMPRESSION: No evidence of retained instrument. Electronically Signed   By: Monte Fantasia M.D.   On: 12/08/20 07:10    Microbiology No results found for this or any previous visit (from the past 240 hour(s)).  Lab Basic Metabolic Panel: No results for input(s): NA, K, CL, CO2, GLUCOSE, BUN, CREATININE, CALCIUM, MG, PHOS in the last 168 hours. Liver Function Tests: No results for input(s): AST, ALT, ALKPHOS, BILITOT, PROT, ALBUMIN in the last 168 hours. No results for input(s): LIPASE, AMYLASE in the last 168 hours. No results for input(s): AMMONIA in the last 168 hours. CBC: No results for input(s): WBC, NEUTROABS, HGB, HCT, MCV, PLT in the last 168 hours. Cardiac Enzymes: No results for input(s): CKTOTAL, CKMB, CKMBINDEX, TROPONINI in the last 168 hours. Sepsis Labs: No results for input(s): PROCALCITON, WBC, LATICACIDVEN in the last 168 hours.  Procedures/Operations  ETT CVL   Roselie Awkward 12/18/2020, 2:32 PM

## 2020-12-24 NOTE — Anesthesia Preprocedure Evaluation (Addendum)
Anesthesia Evaluation  Patient identified by MRN, date of birth, ID band Patient unresponsive    Reviewed: Allergy & Precautions, H&P , NPO status , Patient's Chart, lab work & pertinent test results  Airway Mallampati: Intubated       Dental no notable dental hx. (+) Teeth Intact, Dental Advisory Given   Pulmonary neg pulmonary ROS, sleep apnea ,    Pulmonary exam normal breath sounds clear to auscultation       Cardiovascular hypertension, Pt. on medications + CAD  negative cardio ROS   Rhythm:Regular Rate:Normal     Neuro/Psych Brain death negative neurological ROS  negative psych ROS   GI/Hepatic negative GI ROS, Neg liver ROS, GERD  ,  Endo/Other  negative endocrine ROSdiabetesHypothyroidism   Renal/GU Renal diseasenegative Renal ROS  negative genitourinary   Musculoskeletal   Abdominal   Peds  Hematology negative hematology ROS (+)   Anesthesia Other Findings   Reproductive/Obstetrics negative OB ROS                            Anesthesia Physical Anesthesia Plan  ASA: VI  Anesthesia Plan: General   Post-op Pain Management:    Induction: Intravenous  PONV Risk Score and Plan: 2 and Treatment may vary due to age or medical condition  Airway Management Planned: Oral ETT  Additional Equipment:   Intra-op Plan:   Post-operative Plan: Post-operative intubation/ventilation  Informed Consent: I have reviewed the patients History and Physical, chart, labs and discussed the procedure including the risks, benefits and alternatives for the proposed anesthesia with the patient or authorized representative who has indicated his/her understanding and acceptance.     Dental advisory given  Plan Discussed with: CRNA  Anesthesia Plan Comments:         Anesthesia Quick Evaluation

## 2020-12-24 NOTE — Transfer of Care (Signed)
Immediate Anesthesia Transfer of Care Note  Patient: [name]  Procedure(s) Performed: ORGAN PROCUREMENT LIVER ONLY (Abdomen)  Patient Location: OR 12  Anesthesia Type:General  Level of Consciousness: Patient remains intubated per anesthesia plan  Airway & Oxygen Therapy: Ventilator d/c'ed per Samy MD orders  Post-op Assessment: Report given to RN Hilda Blades RN   Complications: No notable events documented.

## 2020-12-24 NOTE — OR Nursing (Signed)
0548:Cross clamp. [id number]: Liver out.

## 2020-12-24 NOTE — Anesthesia Postprocedure Evaluation (Signed)
Anesthesia Post Note  Patient: [name]  Procedure(s) Performed: ORGAN PROCUREMENT LIVER ONLY (Abdomen)     Patient location during evaluation: Other Anesthesia Type: General Post-procedure mental status: Brain death. Anesthetic complications: no   No notable events documented.  Last Vitals:  Vitals:   December 31, 2020 0300 2020/12/31 0315  BP: 120/72   Pulse: 83 78  Resp: 12 12  Temp: 36.6 C 36.6 C  SpO2: 100% 100%    Last Pain:  Vitals:   12-31-20 0200  TempSrc: Bladder                 Velvie Thomaston,W. EDMOND

## 2020-12-24 DEATH — deceased

## 2021-02-25 ENCOUNTER — Ambulatory Visit: Payer: Medicare Other | Admitting: Interventional Cardiology

## 2021-03-01 ENCOUNTER — Other Ambulatory Visit: Payer: Medicare Other

## 2021-06-08 IMAGING — CT CT CERVICAL SPINE W/O CM
3 series · 14 of 33 positions shown, 17 images · non-contrast
Comparison: None.

CLINICAL DATA: Found unresponsive on floor.

EXAM:
CT CERVICAL SPINE WITHOUT CONTRAST
TECHNIQUE: Multidetector CT imaging of the cervical spine was performed without
intravenous contrast. Multiplanar CT image reconstructions were also
generated.

[Series 5: c spine soft · axial · 0.41mm/px · z∈[+990,+1138]mm · 6 of 97 slices shown, 8 images]
[im 15/97  soft-tissue]
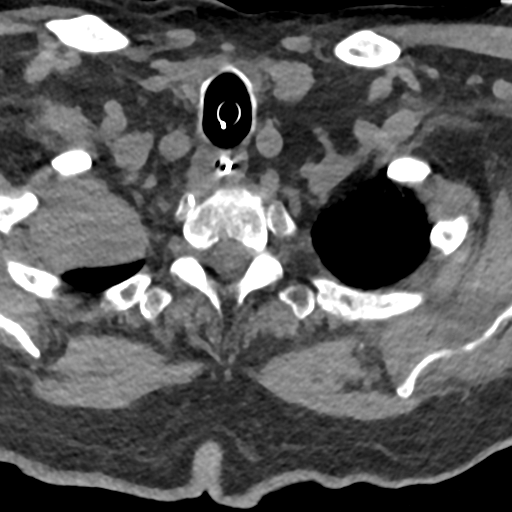
[im 15/97  bone]
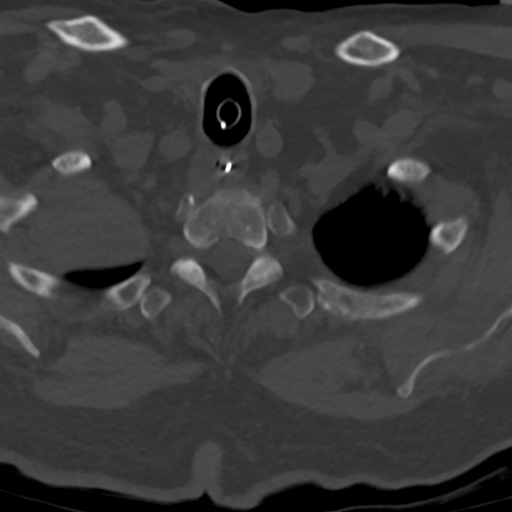
[im 30/97  bone]
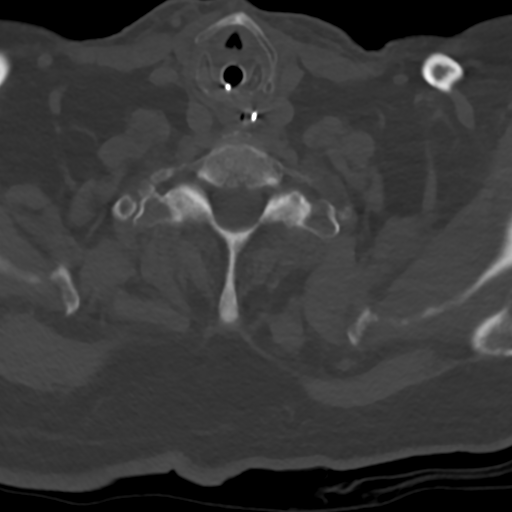
[im 45/97  bone]
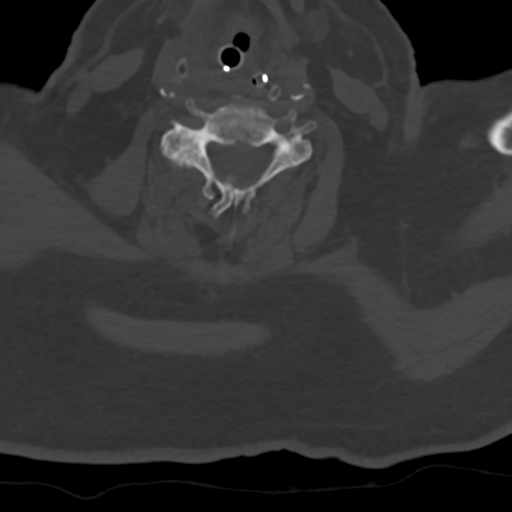
[im 60/97  bone]
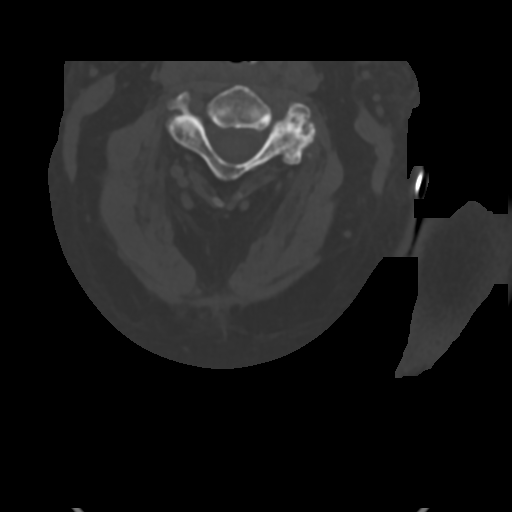
[im 74/97  soft-tissue]
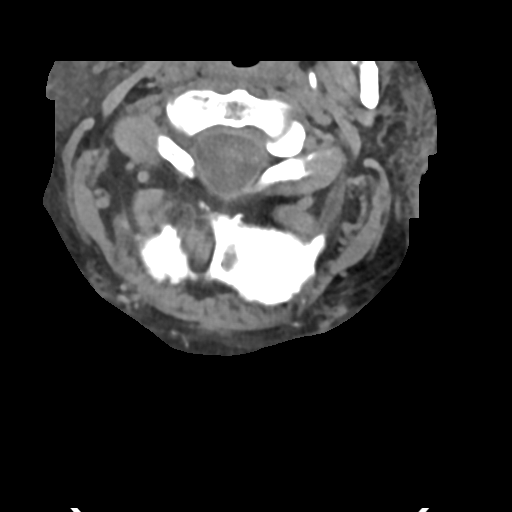
[im 74/97  bone]
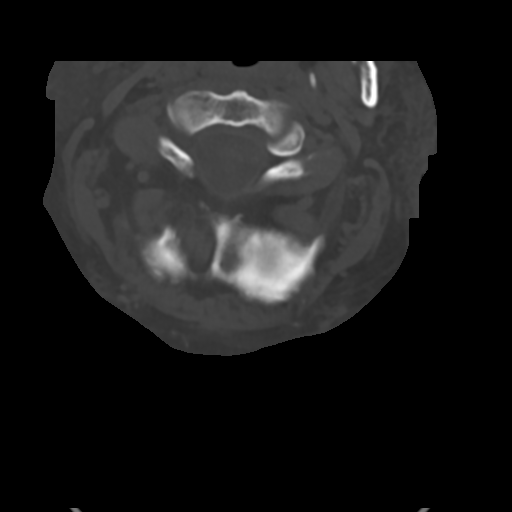
[im 89/97  bone]
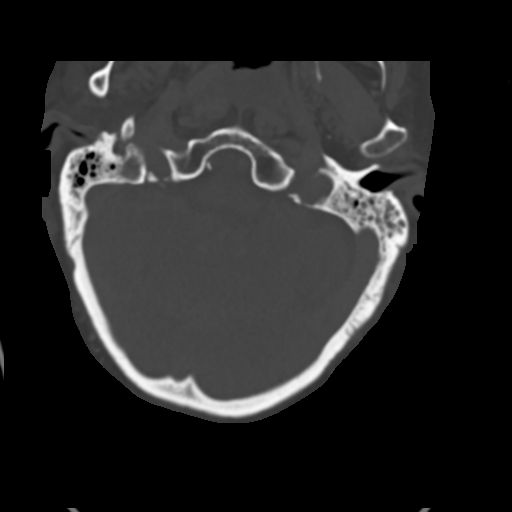

[Series 8: sag bone · sagittal · 0.36mm/px · 5 of 93 slices shown, 6 images]
[im 31/93  bone]
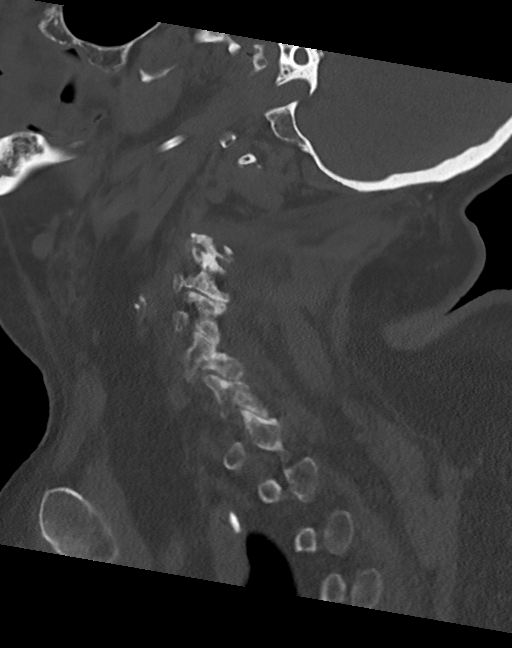
[im 39/93  bone]
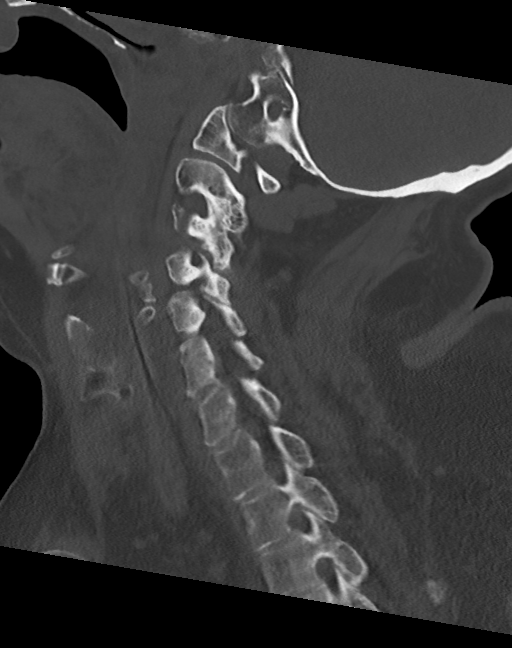
[im 47/93  soft-tissue]
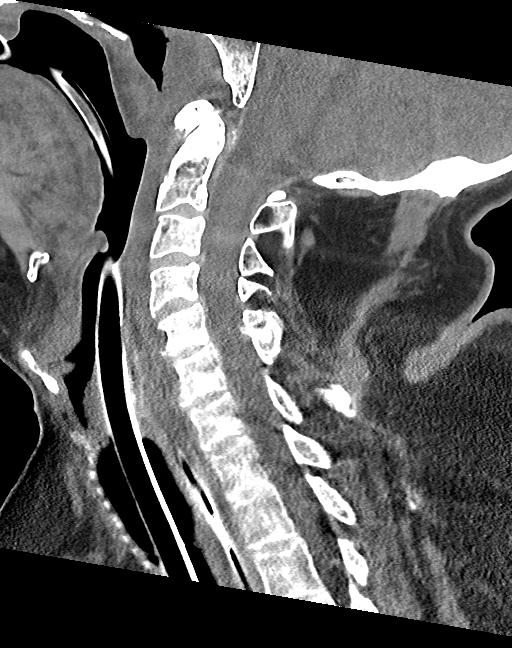
[im 47/93  bone]
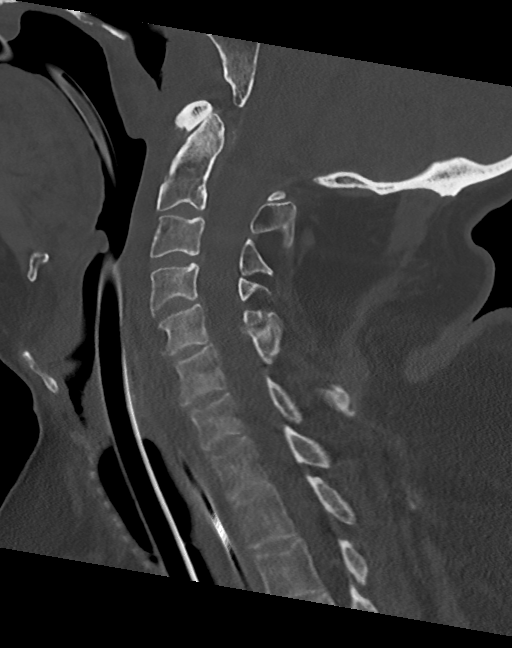
[im 54/93  bone]
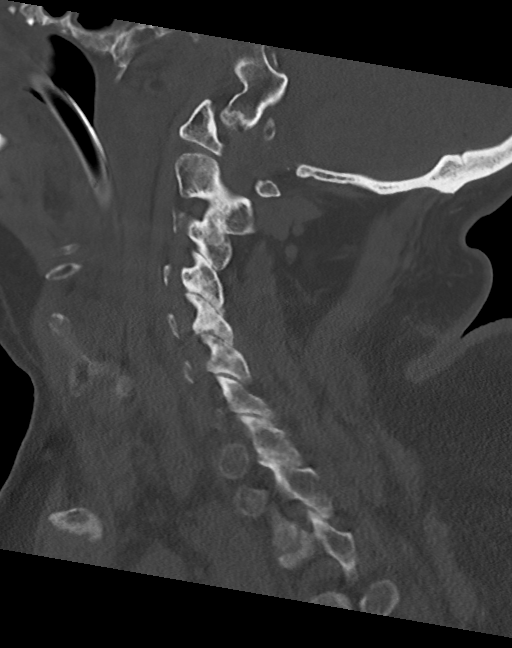
[im 62/93  bone]
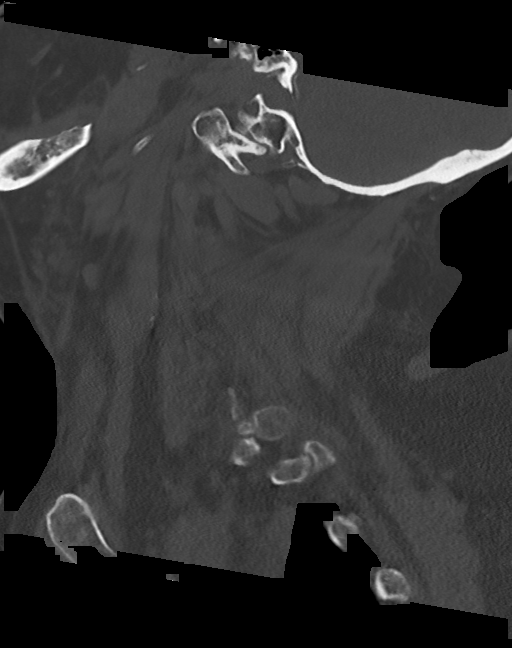

[Series 9: cor bone · coronal · 0.33mm/px · 3 of 76 slices shown]
[im 16/76  bone]
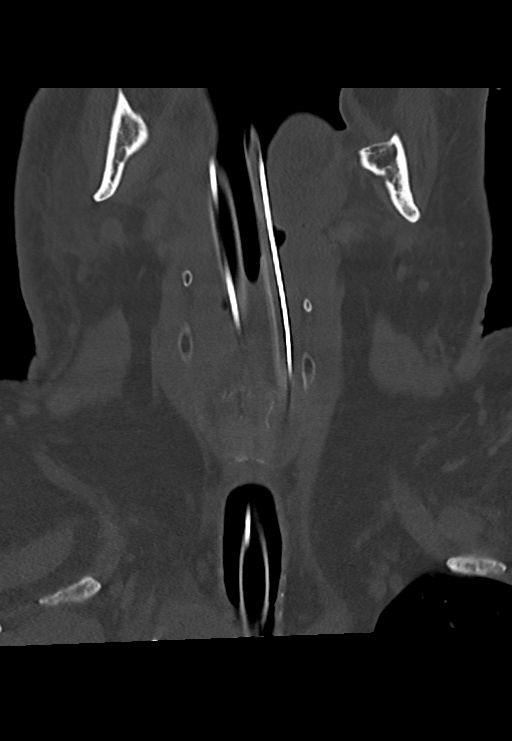
[im 31/76  bone]
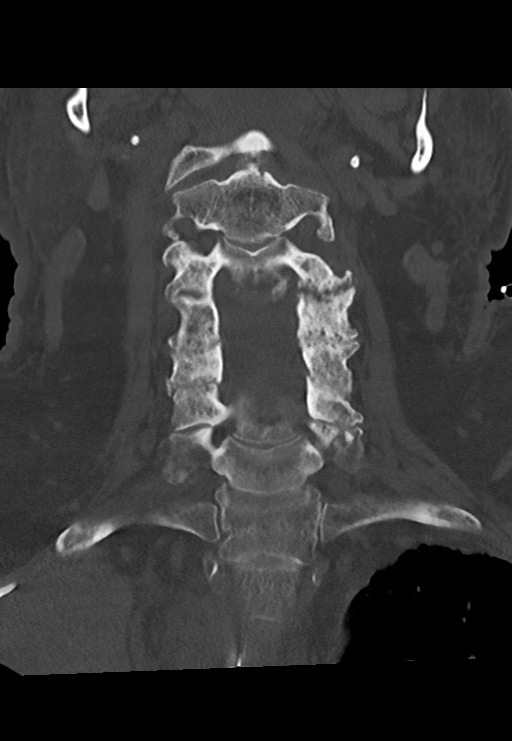
[im 46/76  bone]
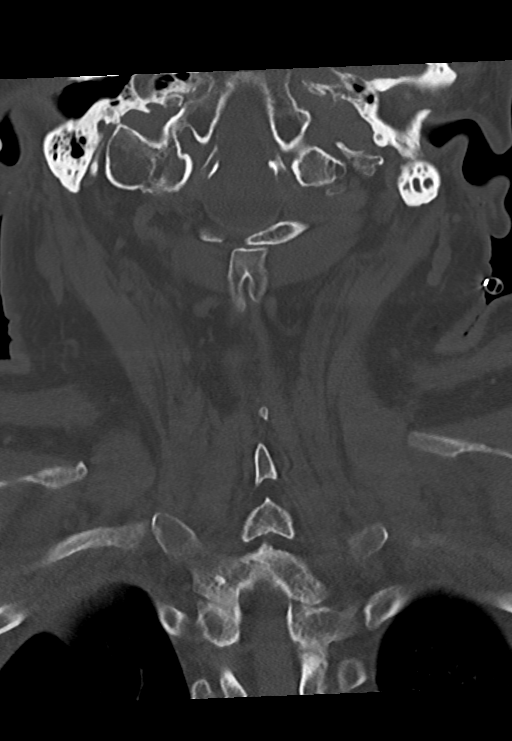

[14 of 33 positions shown; findings below may reference images not displayed]

FINDINGS: Alignment: Normal.

Skull base and vertebrae: No acute fracture. No primary bone lesion
or focal pathologic process. Non fusion posterior arch of C1,
variant anatomy. There is also non fusion of the spinous process of
C5 and C6.

Soft tissues and spinal canal: No prevertebral fluid or swelling. No
visible canal hematoma.

Disc levels: Mild degenerative disc disease at C5-C6. There is
multilevel facet hypertrophy.

Upper chest: Suspected right upper lobe collapse with complete
consolidation in the anterior right lung apex. Endotracheal and
enteric tubes in place, the upper thoracic esophagus is
fluid-filled.

Other: None.
IMPRESSION: 1. Mild degenerative change in the cervical spine without acute
fracture or subluxation.
2. Suspected right upper lobe collapse with complete consolidation
of lung in the anterior right lung apex.

## 2021-06-08 IMAGING — DX DG CHEST 1V PORT
1 series · 1 of 1 positions shown · non-contrast
Comparison: No recent exams.  Radiograph 08/28/2018.

CLINICAL DATA: Intubation.

EXAM:
PORTABLE CHEST 1 VIEW

[chest ap]
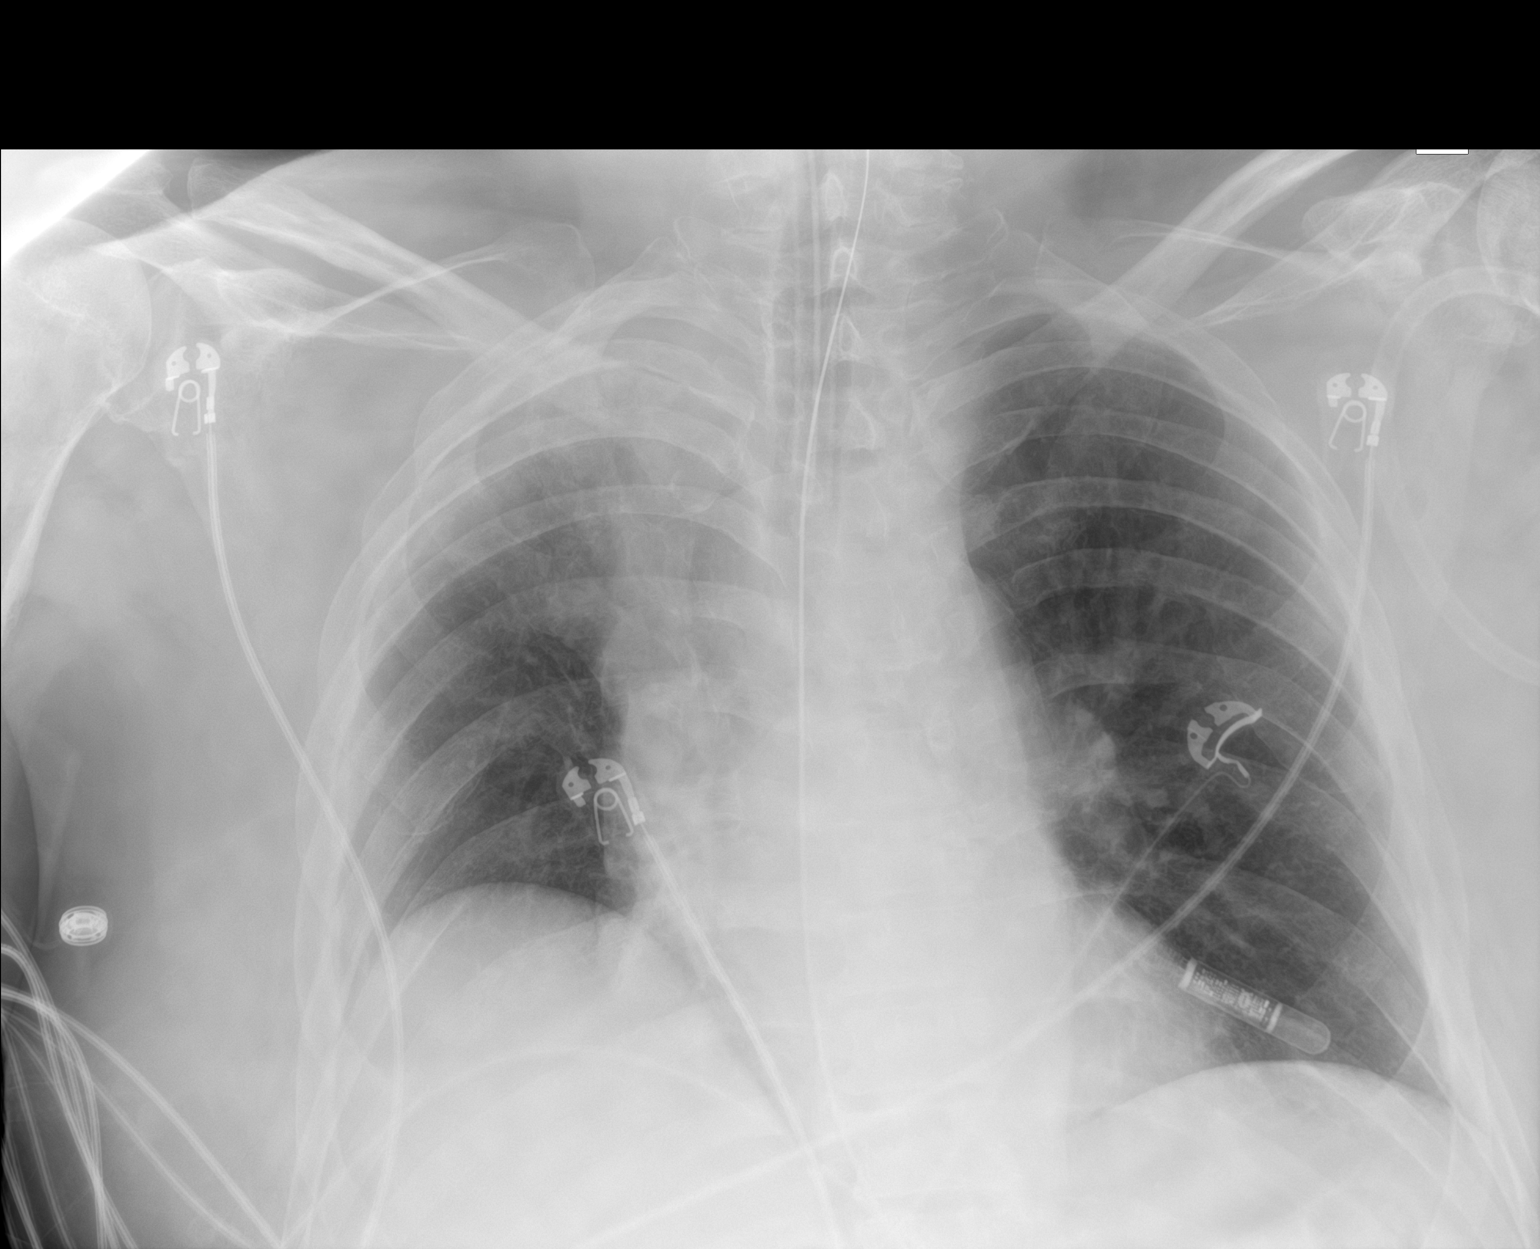

[1 of 1 positions shown; findings below may reference images not displayed]

FINDINGS: Planted loop recorder projects over the left chest. Endotracheal
tube tip 4.9 cm from the carina. Enteric tube in place with tip
below the diaphragm not included in the field of view. Low lung
volumes. Chronic elevation of right hemidiaphragm. Mild cardiomegaly
with stable mediastinal contours. There are ill-defined opacities in
the right suprahilar lung extending to the apex. No large pleural
effusion or pneumothorax. No pulmonary edema.
IMPRESSION: 1. Endotracheal tube tip 4.9 cm from the carina. Enteric tube in
place with tip below the diaphragm not included in the field of
view.
2. Low lung volumes. Ill-defined opacities in the right suprahilar
lung extending to the apex, may represent atelectasis or pneumonia.

## 2021-06-10 IMAGING — US US BIOPSY CORE LIVER
1 series · 10 of 10 positions shown · non-contrast
Comparison: none

INDICATION: 70-year-old male pronounced brain dead, liver biopsy requested for
organ donation purposes.

[Series 1: us biopsy (liver) · 10 of 10 slices shown]
[im 1/10]
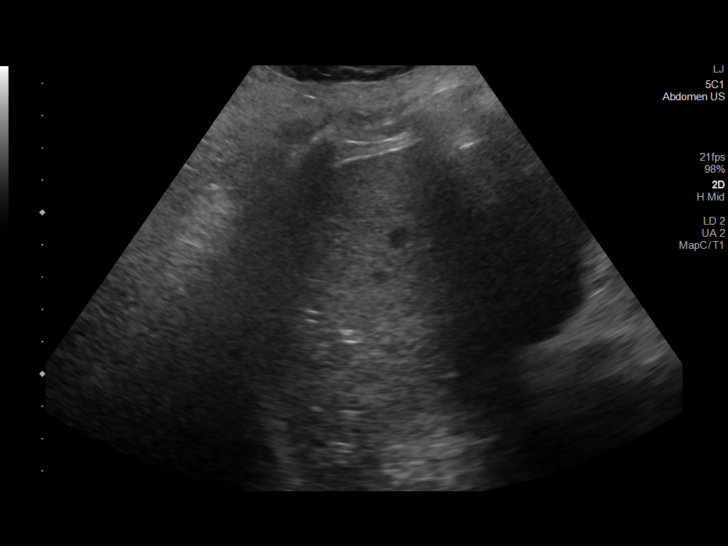
[im 2/10]
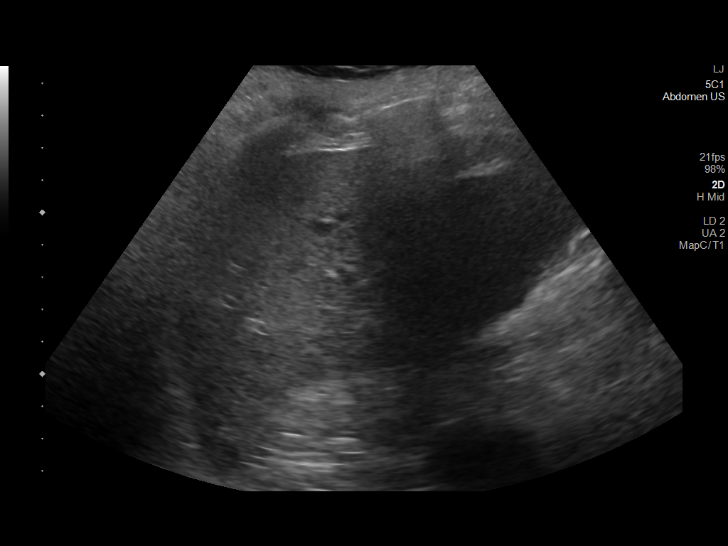
[im 3/10]
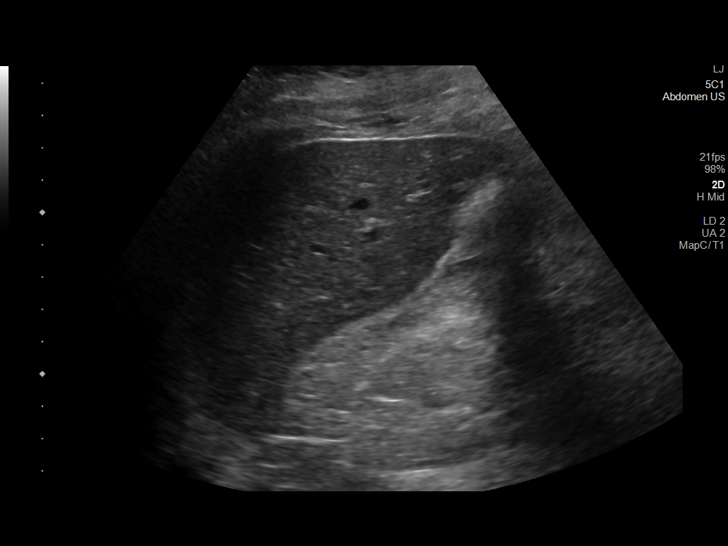
[im 4/10]
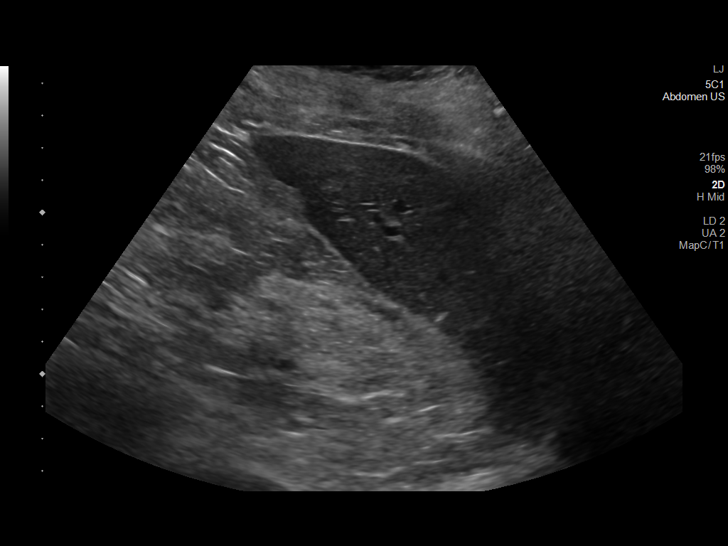
[im 5/10]
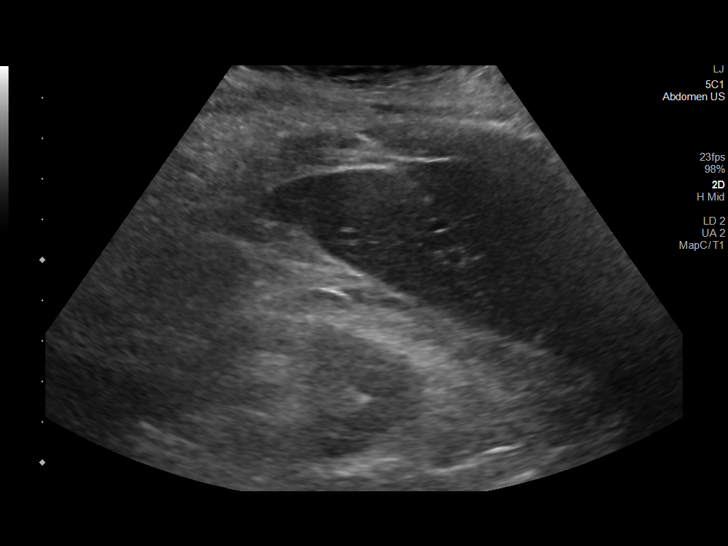
[im 6/10]
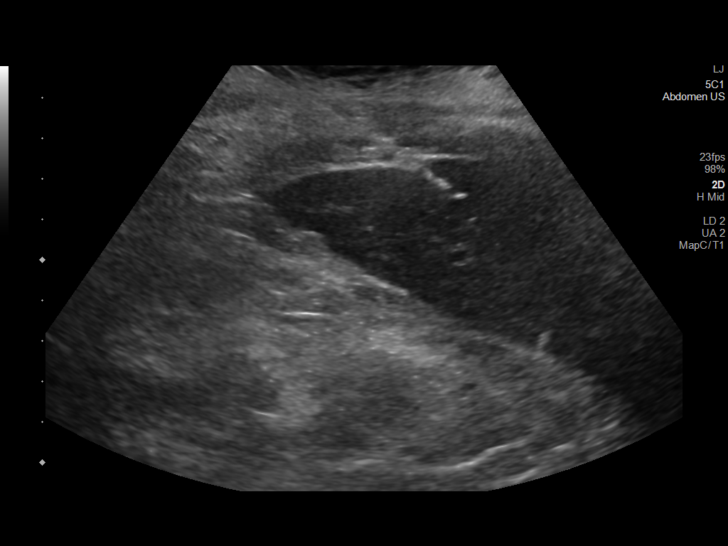
[im 7/10]
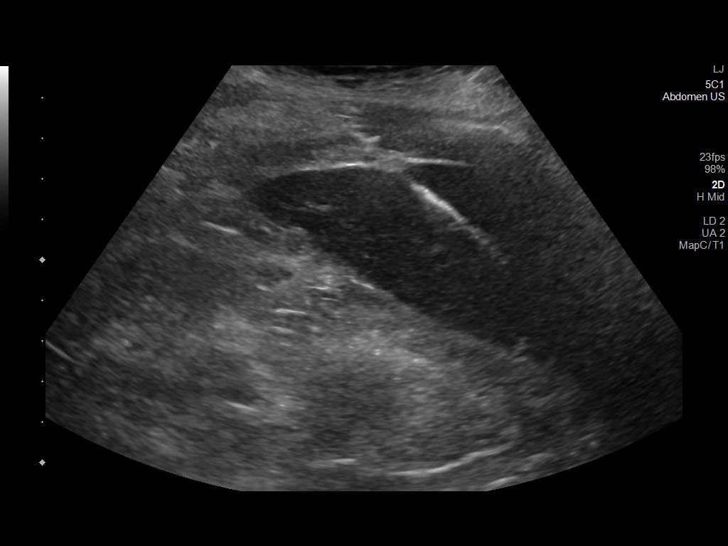
[im 8/10]
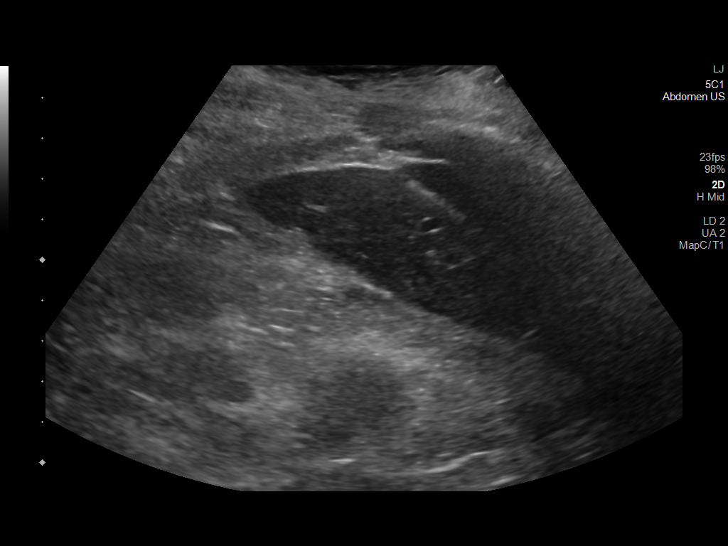
[im 9/10]
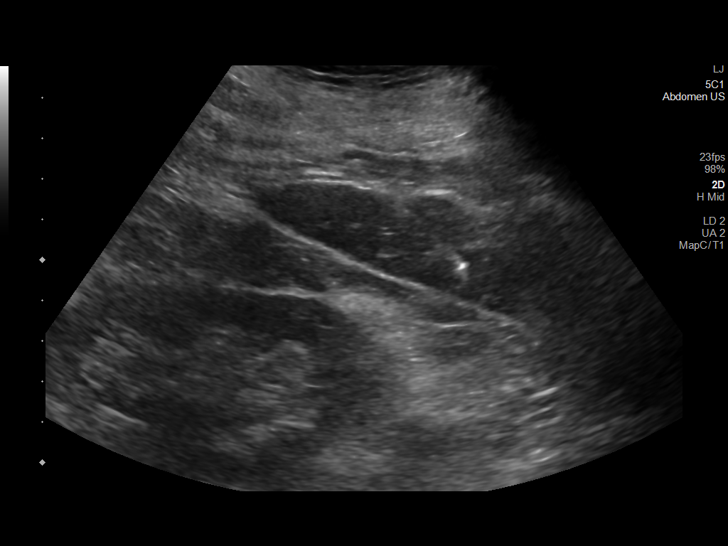
[im 10/10]
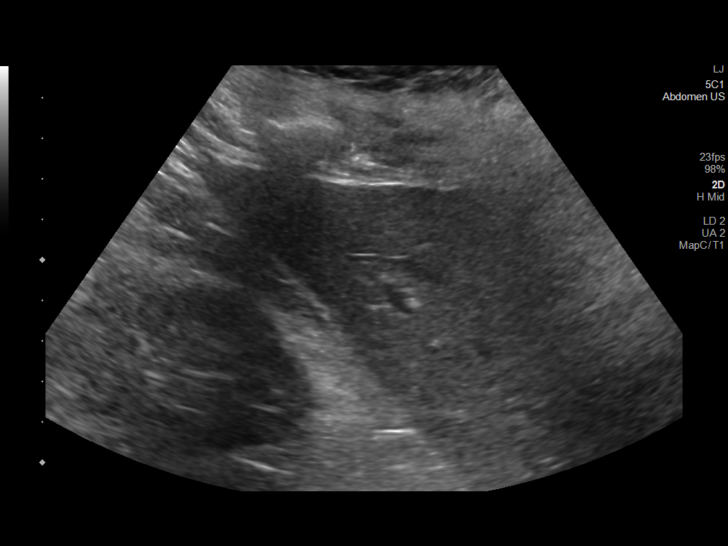

[10 of 10 positions shown; findings below may reference images not displayed]

EXAM:
ULTRASOUND BIOPSY CORE LIVER

MEDICATIONS:
None.

ANESTHESIA/SEDATION:
None.

COMPLICATIONS:
None immediate.

PROCEDURE:
Informed written consent was obtained as part of organ donation
protocol. Maximal Sterile Barrier Technique was utilized including
caps, mask, sterile gowns, sterile gloves, sterile drape, hand
hygiene and skin antiseptic. A timeout was performed prior to the
initiation of the procedure.

Preprocedure ultrasound demonstrated safe window in the right upper
quadrant for nonfocal liver biopsy. The right upper quadrant was
prepped and draped in standard fashion. A skin nick was made. A 18
gauge core biopsy device was advanced to the hepatic parenchyma
under ultrasound guidance. Next, a total of 2, 18 gauge core
biopsies were obtained. The samples were placed on saline soaked
Telfa pad and sent to pathology. Postprocedure ultrasound
demonstrated no evidence of perihepatic fluid collection.
IMPRESSION: Technically successful ultrasound-guided nonfocal core liver biopsy
from the right lobe of the liver.
